# Patient Record
Sex: Male | Born: 1975 | Race: White | Hispanic: No | Marital: Single | Smoking: Former smoker
Health system: Southern US, Community
[De-identification: ages and names within clinical notes are randomized; demographics above are authoritative.]

## PROBLEM LIST (undated history)

## (undated) DIAGNOSIS — J45909 Unspecified asthma, uncomplicated: Secondary | ICD-10-CM

## (undated) DIAGNOSIS — G4733 Obstructive sleep apnea (adult) (pediatric): Secondary | ICD-10-CM

## (undated) DIAGNOSIS — Z9989 Dependence on other enabling machines and devices: Secondary | ICD-10-CM

## (undated) DIAGNOSIS — J309 Allergic rhinitis, unspecified: Secondary | ICD-10-CM

## (undated) DIAGNOSIS — G43909 Migraine, unspecified, not intractable, without status migrainosus: Secondary | ICD-10-CM

## (undated) DIAGNOSIS — F419 Anxiety disorder, unspecified: Secondary | ICD-10-CM

## (undated) DIAGNOSIS — I10 Essential (primary) hypertension: Secondary | ICD-10-CM

## (undated) DIAGNOSIS — B159 Hepatitis A without hepatic coma: Secondary | ICD-10-CM

## (undated) HISTORY — DX: Migraine, unspecified, not intractable, without status migrainosus: G43.909

## (undated) HISTORY — DX: Obstructive sleep apnea (adult) (pediatric): G47.33

## (undated) HISTORY — DX: Essential (primary) hypertension: I10

## (undated) HISTORY — DX: Dependence on other enabling machines and devices: Z99.89

## (undated) HISTORY — DX: Hepatitis a without hepatic coma: B15.9

## (undated) HISTORY — DX: Anxiety disorder, unspecified: F41.9

## (undated) HISTORY — PX: WISDOM TOOTH EXTRACTION: SHX21

## (undated) HISTORY — DX: Unspecified asthma, uncomplicated: J45.909

## (undated) HISTORY — DX: Allergic rhinitis, unspecified: J30.9

---

## 2003-09-23 HISTORY — PX: LASIK: SHX215

## 2011-05-21 LAB — LIPID PANEL
Cholesterol: 191 mg/dL (ref 0–200)
HDL: 65 mg/dL (ref 35–70)
LDL Cholesterol: 110 mg/dL
Triglycerides: 79 mg/dL (ref 40–160)

## 2011-05-21 LAB — TSH: TSH: 1.9 u[IU]/mL (ref 0.41–5.90)

## 2011-11-09 LAB — BASIC METABOLIC PANEL
BUN: 15 mg/dL (ref 4–21)
Creatinine: 0.9 mg/dL (ref 0.6–1.3)
Glucose: 104 mg/dL
Potassium: 4.7 mmol/L (ref 3.4–5.3)
Sodium: 134 mmol/L — AB (ref 137–147)

## 2011-11-09 LAB — CBC AND DIFFERENTIAL
Hemoglobin: 14.5 g/dL (ref 13.5–17.5)
WBC: 5.9 10^3/mL

## 2012-12-16 ENCOUNTER — Encounter: Payer: Self-pay | Admitting: Internal Medicine

## 2012-12-16 ENCOUNTER — Other Ambulatory Visit (INDEPENDENT_AMBULATORY_CARE_PROVIDER_SITE_OTHER): Payer: BC Managed Care – PPO

## 2012-12-16 ENCOUNTER — Telehealth: Payer: Self-pay | Admitting: Internal Medicine

## 2012-12-16 ENCOUNTER — Ambulatory Visit (INDEPENDENT_AMBULATORY_CARE_PROVIDER_SITE_OTHER): Payer: BC Managed Care – PPO | Admitting: Internal Medicine

## 2012-12-16 VITALS — BP 120/88 | HR 78 | Temp 98.1°F | Ht 67.0 in | Wt 209.1 lb

## 2012-12-16 DIAGNOSIS — G4733 Obstructive sleep apnea (adult) (pediatric): Secondary | ICD-10-CM | POA: Insufficient documentation

## 2012-12-16 DIAGNOSIS — J309 Allergic rhinitis, unspecified: Secondary | ICD-10-CM

## 2012-12-16 DIAGNOSIS — F988 Other specified behavioral and emotional disorders with onset usually occurring in childhood and adolescence: Secondary | ICD-10-CM | POA: Insufficient documentation

## 2012-12-16 DIAGNOSIS — Z0000 Encounter for general adult medical examination without abnormal findings: Secondary | ICD-10-CM

## 2012-12-16 DIAGNOSIS — I10 Essential (primary) hypertension: Secondary | ICD-10-CM

## 2012-12-16 DIAGNOSIS — F411 Generalized anxiety disorder: Secondary | ICD-10-CM | POA: Insufficient documentation

## 2012-12-16 DIAGNOSIS — J302 Other seasonal allergic rhinitis: Secondary | ICD-10-CM | POA: Insufficient documentation

## 2012-12-16 DIAGNOSIS — Z23 Encounter for immunization: Secondary | ICD-10-CM

## 2012-12-16 DIAGNOSIS — Z9989 Dependence on other enabling machines and devices: Secondary | ICD-10-CM

## 2012-12-16 LAB — URINALYSIS, ROUTINE W REFLEX MICROSCOPIC
Ketones, ur: NEGATIVE
Leukocytes, UA: NEGATIVE
Specific Gravity, Urine: 1.025 (ref 1.000–1.030)
Urine Glucose: NEGATIVE
pH: 6 (ref 5.0–8.0)

## 2012-12-16 LAB — CBC WITH DIFFERENTIAL/PLATELET
Basophils Relative: 0.7 % (ref 0.0–3.0)
Hemoglobin: 15.5 g/dL (ref 13.0–17.0)
Lymphocytes Relative: 27.3 % (ref 12.0–46.0)
Monocytes Relative: 11.9 % (ref 3.0–12.0)
Neutro Abs: 2.7 10*3/uL (ref 1.4–7.7)
RBC: 5.37 Mil/uL (ref 4.22–5.81)
WBC: 4.9 10*3/uL (ref 4.5–10.5)

## 2012-12-16 LAB — HEPATIC FUNCTION PANEL
ALT: 68 U/L — ABNORMAL HIGH (ref 0–53)
AST: 32 U/L (ref 0–37)
Albumin: 4.7 g/dL (ref 3.5–5.2)
Total Protein: 7.9 g/dL (ref 6.0–8.3)

## 2012-12-16 LAB — BASIC METABOLIC PANEL
Calcium: 9.6 mg/dL (ref 8.4–10.5)
GFR: 68.88 mL/min (ref >60.00)
Sodium: 132 mEq/L — ABNORMAL LOW (ref 135–145)

## 2012-12-16 LAB — LIPID PANEL
Cholesterol: 218 mg/dL — ABNORMAL HIGH (ref 0–200)
Total CHOL/HDL Ratio: 4
Triglycerides: 108 mg/dL (ref 0.0–149.0)

## 2012-12-16 LAB — LDL CHOLESTEROL, DIRECT: Direct LDL: 144.5 mg/dL

## 2012-12-16 MED ORDER — ALPRAZOLAM 0.5 MG PO TABS: 0.5000 mg | ORAL_TABLET | Freq: Every evening | ORAL | Status: DC | PRN

## 2012-12-16 MED ORDER — LISINOPRIL-HYDROCHLOROTHIAZIDE 20-25 MG PO TABS: 1.0000 | ORAL_TABLET | Freq: Every day | ORAL | Status: DC

## 2012-12-16 MED ORDER — FLUTICASONE PROPIONATE 50 MCG/ACT NA SUSP: 2.0000 | Freq: Every day | NASAL | Status: DC

## 2012-12-16 MED ORDER — CETIRIZINE HCL 10 MG PO TABS: 10.0000 mg | ORAL_TABLET | Freq: Every day | ORAL | Status: DC

## 2012-12-16 MED ORDER — AMPHETAMINE-DEXTROAMPHET ER 10 MG PO CP24: 10.0000 mg | ORAL_CAPSULE | ORAL | Status: DC

## 2012-12-16 MED ORDER — DULOXETINE HCL 30 MG PO CPEP: 30.0000 mg | ORAL_CAPSULE | Freq: Every day | ORAL | Status: DC

## 2012-12-16 NOTE — Telephone Encounter (Signed)
Pt has an upcoming consult appt on 01-28-13. Pt wants to know would he be allowed to administer his allergy vaccines at home. He states he moved from [facility] and this is what he used to do. I spoke with Florentina Addison and she states to advise the pt that this decision is made on an individual basis and that he would need to discuss with CY at upcoming appt. Carron Curie, CMA

## 2012-12-16 NOTE — Assessment & Plan Note (Signed)
BP Readings from Last 3 Encounters:  12/16/12 120/88   Reports suboptimally controlled diastolic pressure Add diuretic to ongoing ACE inhibitor - new erx done Recheck in 6-8 weeks, patient call sooner if problems

## 2012-12-16 NOTE — Assessment & Plan Note (Signed)
Chronic history of same, exacerbated by move to [place] in 2013 Recommend daily oral antihistamine and begin nasal steroid spray Previously on allergy shots and would like to resume same, refer to allergist for assistance with this

## 2012-12-16 NOTE — Assessment & Plan Note (Signed)
Long history of same, previously under psychiatry care until 2010 Reports previous trials of Paxil, Zoloft ineffective Also on Cymbalta, Abilify, mood stabilizer and Adderall in past - but reports stopping all in 2010 because "doses were too high" Maintained on low-dose alprazolam as needed, but increasing anxiety symptoms reviewed, manifest with excessive worry Also reports problems with focus because of distractibility and irritability Long discussion regarding medication options and behavioral health therapies for treatment of same After discussion, we'll resume low dose Cymbalta 30 mg daily, extended release of low-dose Adderall and increase alprazolam dose (0.5 mg twice a day when necessary) Agreeable to counseling, referral to behavioral health at this time Followup 6-8 weeks to review symptoms and medications, patient to call sooner if problems Verified no SI/HI

## 2012-12-16 NOTE — Assessment & Plan Note (Signed)
Will send to prior PCP for copy of sleep study done in [place] 12/2011  continue CPAP as ongoing

## 2012-12-16 NOTE — Progress Notes (Signed)
Subjective:    Patient ID: Tony [name], male    DOB: May 12, 1976, 37 y.o.   MRN: [id number]  HPI  New patient today to me and our practice, here today to establish care patient is here today for annual physical. Patient feels well overall.  Also reviewed chronic medical issues: Anxiety - chronic history of same. Overlap with depression and ADD symptoms. Problems increasing in recent months precipitated by move to [facility] and change in jobs. Manifest with excessive worry and poor sleep. Numerous med trials in past and effective, no daily medication therapy since 2010, but would be willing to resume same. Also requests increase in xanax dose which he uses as needed  hypertension - on ACE inhibitor daily. Reports diastolic uncontrolled. Denies chest pain, headache, visual changes or edema  allergic rhinitis -  Increase in symptoms precipitated by move to [facility]. Remotely on allergy shots and interested in resuming same. Manifest with recurrent sinus infections, rhinorrhea, sneezing and eye itching/watery  OSA - on CPAP since 12/2011 - sleep study done in [place] - describes as "severe". the patient reports compliance with medication(s) as prescribed. Denies adverse side effects.   Past Medical History  Diagnosis Date  . Asthma   . Anxiety     overlap with depression and ADD  . Hypertension   . Hepatitis A   . Migraines   . OSA on CPAP     Sleep study 12/2011: severe, CPAP  . Allergic rhinitis    Family History  Problem Relation Age of Onset  . Hypertension Mother   . Arthritis Father   . Hypertension Father   . Diabetes Other    History  Substance Use Topics  . Smoking status: Former Smoker    Quit date: 09/22/2008  . 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 Alcohol Use: Yes     Comment: OCCASSIONAL   Review of Systems Constitutional: Negative for fever + unexpected  weight change (Increased 20 pounds in past 12 months).  Respiratory: Negative for cough and shortness of breath.   Cardiovascular: Negative for chest pain or palpitations.  Gastrointestinal: Negative for abdominal pain, no bowel changes.  Musculoskeletal: Negative for gait problem or joint swelling.  Skin: Negative for rash.  Neurological: Negative for dizziness or headache.  No other specific complaints in a complete review of systems (except as listed in HPI above).     Objective:   Physical Exam BP 120/88  Pulse 78  Temp(Src) 98.1 F (36.7 C) (Oral)  Ht 5\' 7"  (1.702 m)  Wt 209 lb 1.9 oz (94.856 kg)  BMI 32.75 kg/m2  SpO2 96% Wt Readings from Last 3 Encounters:  12/16/12 209 lb 1.9 oz (94.856 kg)   Constitutional: he is overweight, but appears well-developed and well-nourished. No distress.  HENT: Head: Normocephalic and atraumatic. Ears: B TMs ok, no erythema or effusion; Nose: Nose clear rhinorrhea, no turbinate swelling. Mouth/Throat: Oropharynx is clear and moist. No oropharyngeal exudate.  Eyes: Conjunctivae and EOM are normal. Pupils are equal, round, and reactive to light. No scleral icterus.  Neck: Thick. Normal range of motion. Neck supple. No JVD present. No thyromegaly present.  Cardiovascular: Normal rate, regular rhythm and normal heart sounds.  No murmur heard. No BLE edema. Pulmonary/Chest: Effort normal and breath sounds normal. No respiratory distress. he has no wheezes.  Abdominal: Soft. Bowel sounds are normal. he exhibits no distension. There  is no tenderness. no masses Musculoskeletal: Normal range of motion, no joint effusions. No gross deformities Neurological: he is alert and oriented to person, place, and time. No cranial nerve deficit. Coordination normal.  Skin: Skin is warm and dry. No rash noted. No erythema.  Psychiatric: he has an anxious mood and affect. behavior is normal. Judgment and thought content normal.   No results found for this basename:  WBC,  HGB,  HCT,  PLT,  GLUCOSE,  CHOL,  TRIG,  HDL,  LDLDIRECT,  LDLCALC,  ALT,  AST,  NA,  K,  CL,  CREATININE,  BUN,  CO2,  TSH,  PSA,  INR,  GLUF,  HGBA1C,  MICROALBUR       Assessment & Plan:   CPX/v70.0 - Today patient counseled on age appropriate routine health concerns for screening and prevention, each reviewed and up to date or declined. Immunizations reviewed and up to date or declined. Labs/ECG reviewed. Risk factors for depression reviewed and negative. Hearing function and visual acuity are intact. ADLs screened and addressed as needed. Functional ability and level of safety reviewed and appropriate. Education, counseling and referrals performed based on assessed risks today. Patient provided with a copy of personalized plan for preventive services.  Also See problem list. Medications and labs reviewed today.

## 2012-12-16 NOTE — Patient Instructions (Signed)
It was good to see you today. We have reviewed your prior records including labs and tests today Health Maintenance reviewed - all recommended immunizations and age-appropriate screenings are up-to-date. we will send to your prior provider(s) for "release of records" as discussed today -  Test(s) ordered today. Your results will be released to MyChart (or called to you) after review, usually within 72hours after test completion. If any changes need to be made, you will be notified at that same time. Increase dose of Xanax Change lisinopril to include diuretic Continue Zyrtec every day and add Flonase nose spray each morning Start Cymbalta 30 mg daily and extended release low-dose Adderall Your prescription(s) have been submitted to your pharmacy electronically, or given to you to take to your pharmacy (controlled substance). Please take as directed and contact our office if you believe you are having problem(s) with the medication(s). Will refer to allergist and to psychologist for counseling - Our office will contact you regarding appointment(s) once made. Please schedule followup in 6-8 weeks to review medications and symptoms + recheck blood pressure, call sooner if problems.  Work on lifestyle changes as discussed (low fat, low carb, increased protein diet; improved exercise efforts; weight loss) to control sugar, blood pressure and cholesterol levels and/or reduce risk of developing other medical problems. Look into [email or web address].com.[email or web address] or other type of food journal to assist you in this process.  Health Maintenance, Males A healthy lifestyle and preventative care can promote health and wellness.  Maintain regular health, dental, and eye exams.  Eat a healthy diet. Foods like vegetables, fruits, whole grains, low-fat dairy products, and lean protein foods contain the nutrients you need without too many calories. Decrease your intake of foods high in solid fats, added sugars, and salt. Get  information about a proper diet from your caregiver, if necessary.  Regular physical exercise is one of the most important things you can do for your health. Most adults should get at least 150 minutes of moderate-intensity exercise (any activity that increases your heart rate and causes you to sweat) each week. In addition, most adults need muscle-strengthening exercises on 2 or more days a week.   Maintain a healthy weight. The body mass index (BMI) is a screening tool to identify possible weight problems. It provides an estimate of body fat based on height and weight. Your caregiver can help determine your BMI, and can help you achieve or maintain a healthy weight. For adults 20 years and older:  A BMI below 18.5 is considered underweight.  A BMI of 18.5 to 24.9 is normal.  A BMI of 25 to 29.9 is considered overweight.  A BMI of 30 and above is considered obese.  Maintain normal blood lipids and cholesterol by exercising and minimizing your intake of saturated fat. Eat a balanced diet with plenty of fruits and vegetables. Blood tests for lipids and cholesterol should begin at age 45 and be repeated every 5 years. If your lipid or cholesterol levels are high, you are over 50, or you are a high risk for heart disease, you may need your cholesterol levels checked more frequently.Ongoing high lipid and cholesterol levels should be treated with medicines, if diet and exercise are not effective.  If you smoke, find out from your caregiver how to quit. If you do not use tobacco, do not start.  If you choose to drink alcohol, do not exceed 2 drinks per day. One drink is considered to be 12 ounces (355 mL) of beer, 5  ounces (148 mL) of wine, or 1.5 ounces (44 mL) of liquor.  Avoid use of street drugs. Do not share needles with anyone. Ask for help if you need support or instructions about stopping the use of drugs.  High blood pressure causes heart disease and increases the risk of stroke. Blood  pressure should be checked at least every 1 to 2 years. Ongoing high blood pressure should be treated with medicines if weight loss and exercise are not effective.  If you are 43 to 38 years old, ask your caregiver if you should take aspirin to prevent heart disease.  Diabetes screening involves taking a blood sample to check your fasting blood sugar level. This should be done once every 3 years, after age 62, if you are within normal weight and without risk factors for diabetes. Testing should be considered at a younger age or be carried out more frequently if you are overweight and have at least 1 risk factor for diabetes.  Colorectal cancer can be detected and often prevented. Most routine colorectal cancer screening begins at the age of 86 and continues through age 69. However, your caregiver may recommend screening at an earlier age if you have risk factors for colon cancer. On a yearly basis, your caregiver may provide home test kits to check for hidden blood in the stool. Use of a small camera at the end of a tube, to directly examine the colon (sigmoidoscopy or colonoscopy), can detect the earliest forms of colorectal cancer. Talk to your caregiver about this at age 39, when routine screening begins. Direct examination of the colon should be repeated every 5 to 10 years through age 49, unless early forms of pre-cancerous polyps or small growths are found.  Hepatitis C blood testing is recommended for all people born from [birth year] through 1966 and any individual with known risks for hepatitis C.  Healthy men should no longer receive prostate-specific antigen (PSA) blood tests as part of routine cancer screening. Consult with your caregiver about prostate cancer screening.  Testicular cancer screening is not recommended for adolescents or adult males who have no symptoms. Screening includes self-exam, caregiver exam, and other screening tests. Consult with your caregiver about any symptoms you have or  any concerns you have about testicular cancer.  Practice safe sex. Use condoms and avoid high-risk sexual practices to reduce the spread of sexually transmitted infections (STIs).  Use sunscreen with a sun protection factor (SPF) of 30 or greater. Apply sunscreen liberally and repeatedly throughout the day. You should seek shade when your shadow is shorter than you. Protect yourself by wearing long sleeves, pants, a wide-brimmed hat, and sunglasses year round, whenever you are outdoors.  Notify your caregiver of new moles or changes in moles, especially if there is a change in shape or color. Also notify your caregiver if a mole is larger than the size of a pencil eraser.  A one-time screening for abdominal aortic aneurysm (AAA) and surgical repair of large AAAs by sound wave imaging (ultrasonography) is recommended for ages 39 to 23 years who are current or former smokers.  Stay current with your immunizations. Document Released: 03/06/2008 Document Revised: 12/01/2011 Document Reviewed: 02/03/2011 [facility] Patient Information 2013 [place], [place].

## 2012-12-29 ENCOUNTER — Ambulatory Visit (INDEPENDENT_AMBULATORY_CARE_PROVIDER_SITE_OTHER): Payer: BC Managed Care – PPO | Admitting: Psychiatry

## 2012-12-29 DIAGNOSIS — F4010 Social phobia, unspecified: Secondary | ICD-10-CM

## 2013-01-14 ENCOUNTER — Telehealth: Payer: Self-pay | Admitting: Internal Medicine

## 2013-01-17 MED ORDER — AMPHETAMINE-DEXTROAMPHET ER 10 MG PO CP24: 10.0000 mg | ORAL_CAPSULE | ORAL | Status: DC

## 2013-01-17 NOTE — Telephone Encounter (Signed)
Patient has requested refill on Adderall per my chart. Prescription present time, given to Valentina Gu - please notify patient of same

## 2013-01-17 NOTE — Telephone Encounter (Signed)
Called pt no answer LMOM rx ready for pick-up.../lmb 

## 2013-01-20 ENCOUNTER — Encounter: Payer: Self-pay | Admitting: Internal Medicine

## 2013-01-20 ENCOUNTER — Ambulatory Visit (INDEPENDENT_AMBULATORY_CARE_PROVIDER_SITE_OTHER): Payer: BC Managed Care – PPO | Admitting: Internal Medicine

## 2013-01-20 VITALS — BP 120/90 | HR 82 | Temp 98.4°F | Wt 214.0 lb

## 2013-01-20 DIAGNOSIS — F988 Other specified behavioral and emotional disorders with onset usually occurring in childhood and adolescence: Secondary | ICD-10-CM

## 2013-01-20 DIAGNOSIS — G43909 Migraine, unspecified, not intractable, without status migrainosus: Secondary | ICD-10-CM

## 2013-01-20 DIAGNOSIS — F411 Generalized anxiety disorder: Secondary | ICD-10-CM

## 2013-01-20 MED ORDER — AMPHETAMINE-DEXTROAMPHET ER 15 MG PO CP24: 15.0000 mg | ORAL_CAPSULE | ORAL | Status: DC

## 2013-01-20 MED ORDER — SUMATRIPTAN SUCCINATE 50 MG PO TABS: 50.0000 mg | ORAL_TABLET | Freq: Once | ORAL | Status: DC | PRN

## 2013-01-20 NOTE — Assessment & Plan Note (Signed)
Long history of same, previously under psychiatry care until 2010 Reports previous trials of Paxil, Zoloft ineffective Previously tried on Cymbalta, Abilify, mood stabilizer and Adderall in past - but reports stopping all in 2010 because "doses were too high" increasing anxiety symptoms reviewed, manifest with excessive worry, distractibility and irritability repeat discussion regarding medication options and behavioral health therapies for treatment of same Tried resumed Cymbalta 30 mg qd, low-dose Adderall XR and increase alprazolam dose (0.5 mg twice a day when necessary) 12/16/12  Intol of cymbalta side effects - does not wish to resume "antidepressant" at this time Will increase Adderall xr to 15mg  - new rx done s/p single evaluation bu Dr Noe Gens for counseling 12/2012 - no plans for follow up  Verified no SI/HI, pt will call if problems unimproved with med changes or if symptoms worse

## 2013-01-20 NOTE — Progress Notes (Signed)
  Subjective:    Patient ID: Tony [name], male    DOB: [birth year], 37 y.o.   MRN: [id number]  HPI   Here for 6 week follow up - anxiety and ADD  Past Medical History  Diagnosis Date  . Asthma   . Anxiety     overlap with depression and ADD  . Hypertension   . Hepatitis A   . Migraines   . OSA on CPAP     Sleep study 12/2011: severe, CPAP  . Allergic rhinitis     Review of Systems  Constitutional: Negative for fever and fatigue.  Respiratory: Negative for cough and shortness of breath.   Cardiovascular: Negative for chest pain and palpitations.  Psychiatric/Behavioral: Negative for behavioral problems, dysphoric mood and decreased concentration.       Objective:   Physical Exam  BP 120/90  Pulse 82  Temp(Src) 98.4 F (36.9 C) (Oral)  Wt 214 lb (97.07 kg)  BMI 33.51 kg/m2  SpO2 99% Wt Readings from Last 3 Encounters:  01/20/13 214 lb (97.07 kg)  12/16/12 209 lb 1.9 oz (94.856 kg)   Constitutional: he is overweight, but appears well-developed and well-nourished. No distress.   Neck: Thick. Normal range of motion. Neck supple. No JVD present. No thyromegaly present.  Cardiovascular: Normal rate, regular rhythm and normal heart sounds.  No murmur heard. No BLE edema. Pulmonary/Chest: Effort normal and breath sounds normal. No respiratory distress. he has no wheezes.  Psychiatric: he has an anxious mood and affect. behavior is normal. Judgment and thought content normal.   Lab Results  Component Value Date   WBC 4.9 12/16/2012   HGB 15.5 12/16/2012   HCT 45.5 12/16/2012   PLT 329.0 12/16/2012   GLUCOSE 101* 12/16/2012   CHOL 218* 12/16/2012   TRIG 108.0 12/16/2012   HDL 53.10 12/16/2012   LDLDIRECT 144.5 12/16/2012   LDLCALC 110 05/21/2011   ALT 68* 12/16/2012   AST 32 12/16/2012   NA 132* 12/16/2012   K 4.5 12/16/2012   CL 97 12/16/2012   CREATININE 1.3 12/16/2012   BUN 16 12/16/2012   CO2 26 12/16/2012   TSH 2.21 12/16/2012      Assessment & Plan:   See  problem list. Medications and labs reviewed today.

## 2013-01-20 NOTE — Assessment & Plan Note (Signed)
Infrequent episodes, uses Imitrex as needed Refill same today, neuro exam benign

## 2013-01-20 NOTE — Patient Instructions (Signed)
It was good to see you today. We have reviewed your prior records including labs and tests today Stop cymbalta - no "antidepresant" replacement at this time Increase dose Adderall to 15mg  daily - Your prescription(s) have been given to you to submit to your pharmacy. Please take as directed and contact our office if you believe you are having problem(s) with the medication(s). If symptoms unimproved followup visit in 6-8 weeks to review, call sooner if worse Otherwise followup in 6 months for blood pressure and weight check, call sooner if problems

## 2013-01-20 NOTE — Assessment & Plan Note (Signed)
Increase Adderall xr to 15mg  qd now - rx done

## 2013-01-28 ENCOUNTER — Institutional Professional Consult (permissible substitution): Payer: BC Managed Care – PPO | Admitting: Internal Medicine

## 2013-02-25 ENCOUNTER — Other Ambulatory Visit: Payer: Self-pay | Admitting: Internal Medicine

## 2013-02-25 MED ORDER — AMPHETAMINE-DEXTROAMPHET ER 15 MG PO CP24: 15.0000 mg | ORAL_CAPSULE | ORAL | Status: DC

## 2013-03-17 ENCOUNTER — Ambulatory Visit (INDEPENDENT_AMBULATORY_CARE_PROVIDER_SITE_OTHER): Payer: BC Managed Care – PPO | Admitting: Internal Medicine

## 2013-03-17 ENCOUNTER — Encounter: Payer: Self-pay | Admitting: Internal Medicine

## 2013-03-17 VITALS — BP 118/78 | HR 89 | Ht 67.0 in | Wt 195.4 lb

## 2013-03-17 DIAGNOSIS — J309 Allergic rhinitis, unspecified: Secondary | ICD-10-CM

## 2013-03-17 DIAGNOSIS — J4520 Mild intermittent asthma, uncomplicated: Secondary | ICD-10-CM

## 2013-03-17 DIAGNOSIS — J45909 Unspecified asthma, uncomplicated: Secondary | ICD-10-CM

## 2013-03-17 DIAGNOSIS — G4733 Obstructive sleep apnea (adult) (pediatric): Secondary | ICD-10-CM

## 2013-03-17 NOTE — Patient Instructions (Addendum)
Order- DME establish for CPAP service and maintenance 10 cwp, supplies, mask of choice    He has a machine    Dx OSA  Order lab- Allergy Profile      Dx allergic rhinitis  Schedule for allergy skin testing- antihistamines block the skin tests, so for 3 days ahead Please- no antihistamines, otc allergy or cough meds,   Please call as needed

## 2013-03-17 NOTE — Progress Notes (Signed)
Subjective:    Patient ID: Tony [name], male    DOB: 25-Oct-1975, 37 y.o.   MRN: [id number]  HPI 37 yo M former smoker with hx OSA/ CPAP, allergic rhinitis Referred by Dr Felicity Coyer for allergy testing. Allergy [facility], [place], [place] Describes lifelong seasonal and perennial allergic rhinitis. Followed by an ENT/allergy group in [place]. Last allergy skin testing was a long time ago-positive for mold, grass and common allergens. Allergy vaccine did help at that time but he quit after a year. Now wakes up with nasal congestion. Worse if visiting [place] rather than here and worse in spring, best in winter. Takes daily Zyrtec and Flonase. Little asthma after childhood.. No history of skin, food or insect sensitivities. NPSG 01/19/12- AHI 49.0/ hr, Weight 186 lbs. CPAP titrated to 10 cwp.  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 Parents living, father with asthma, nobody known to have OSA.   Prior to Admission medications   Medication Sig Start Date End Date Taking? Authorizing Provider  ALPRAZolam Prudy Feeler) 0.5 MG tablet Take 1 tablet (0.5 mg total) by mouth at bedtime as needed for sleep or anxiety. 12/16/12  Yes Newt Lukes, MD  amphetamine-dextroamphetamine (ADDERALL XR) 15 MG 24 hr capsule Take 1 capsule (15 mg total) by mouth every morning. 02/25/13  Yes Nicki Reaper, NP  cetirizine (ZYRTEC) 10 MG tablet Take 1 tablet (10 mg total) by mouth daily. 12/16/12  Yes Newt Lukes, MD  fluticasone (FLONASE) 50 MCG/ACT nasal spray Place 2 sprays into the nose daily. 12/16/12  Yes Newt Lukes, MD  lisinopril-hydrochlorothiazide (PRINZIDE,ZESTORETIC) 20-25 MG per tablet Take 1 tablet by mouth daily. 12/16/12  Yes Newt Lukes, MD  SUMAtriptan (IMITREX) 50 MG tablet Take 1 tablet (50 mg total) by mouth once as needed for migraine. 01/20/13  Yes Newt Lukes, MD   Past Medical History  Diagnosis Date  . Asthma   . Anxiety     overlap with  depression and ADD  . Hypertension   . Hepatitis A   . Migraines   . OSA on CPAP     Sleep study 12/2011: severe, CPAP  . Allergic rhinitis   . Migraine    Past Surgical History  Procedure Laterality Date  . Lasik  2005  . Wisdom tooth extraction     Family History  Problem Relation Age of Onset  . Hypertension Mother   . Arthritis Father   . Hypertension Father   . Diabetes Other   . Emphysema Paternal Grandfather   . Asthma Paternal Grandfather    History   Social History  . Marital Status: Single    Spouse Name: N/A    Number of Children: N/A  . Years of Education: N/A   Occupational History  . 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 Smoking status: Former Smoker -- 0.24 packs/day for 3 years    Types: Cigarettes    Quit date: 09/22/2008  . 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 Alcohol Use: Yes     Comment: OCCASSIONAL  . Drug Use: No  . Sexually Active: Not on file   Other Topics Concern  . Not on file   Social History Narrative  . No narrative on file   Review of Systems  Constitutional: Negative for fever and unexpected weight change.  HENT:  Positive for congestion, rhinorrhea, sneezing, postnasal drip and sinus pressure. Negative for ear pain, nosebleeds, sore throat, trouble swallowing and dental problem.        Occasional symptoms  Eyes: Negative for redness and itching.  Respiratory: Positive for cough. Negative for chest tightness, shortness of breath and wheezing.   Cardiovascular: Negative for palpitations and leg swelling.  Gastrointestinal: Negative for nausea and vomiting.  Genitourinary: Negative for dysuria.  Musculoskeletal: Negative for joint swelling.  Skin: Negative for rash.  Neurological: Positive for headaches.  Hematological: Does not bruise/bleed easily.  Psychiatric/Behavioral: Negative for dysphoric  mood. The patient is not nervous/anxious.        Objective:   Physical Exam OBJ- Physical Exam General- Alert, Oriented, Affect-appropriate, Distress- none acute. Mild over-weight Skin- rash-none, lesions- none, excoriation- none Lymphadenopathy- none Head- atraumatic            Eyes- Gross vision intact, PERRLA, conjunctivae and secretions clear            Ears- Hearing, canals-normal            Nose- Clear, no-Septal dev, mucus, polyps, erosion, perforation             Throat- Mallampati II-III , mucosa clear , drainage- none, tonsils- atrophic Neck- flexible , trachea midline, no stridor , thyroid nl, carotid no bruit Chest - symmetrical excursion , unlabored           Heart/CV- RRR , no murmur , no gallop  , no rub, nl s1 s2                           - JVD- none , edema- none, stasis changes- none, varices- none           Lung- clear to P&A, wheeze- none, cough- none , dullness-none, rub- none           Chest wall-  Abd- tender-no, distended-no, bowel sounds-present, HSM- no Br/ Gen/ Rectal- Not done, not indicated Extrem- cyanosis- none, clubbing, none, atrophy- none, strength- nl Neuro- grossly intact to observation     Assessment & Plan:

## 2013-03-18 ENCOUNTER — Other Ambulatory Visit: Payer: BC Managed Care – PPO

## 2013-03-18 DIAGNOSIS — J309 Allergic rhinitis, unspecified: Secondary | ICD-10-CM

## 2013-03-21 LAB — ALLERGY FULL PROFILE
Allergen, D pternoyssinus,d7: 5.09 kU/L — ABNORMAL HIGH
Bahia Grass: 0.16 kU/L — ABNORMAL HIGH
Box Elder IgE: 0.17 kU/L — ABNORMAL HIGH
Common Ragweed: <0.1 kU/L
Dog Dander: 0.21 kU/L — ABNORMAL HIGH
Elm IgE: <0.1 kU/L
G005 Rye, Perennial: 0.25 kU/L — ABNORMAL HIGH
G009 Red Top: 0.35 kU/L — ABNORMAL HIGH
House Dust Hollister: 0.38 kU/L — ABNORMAL HIGH
Oak: <0.1 kU/L
Sycamore Tree: <0.1 kU/L
Timothy Grass: 0.26 kU/L — ABNORMAL HIGH

## 2013-03-23 NOTE — Progress Notes (Signed)
Quick Note:  Pt aware of results. ______ 

## 2013-03-31 DIAGNOSIS — J4520 Mild intermittent asthma, uncomplicated: Secondary | ICD-10-CM | POA: Insufficient documentation

## 2013-03-31 NOTE — Assessment & Plan Note (Addendum)
Remote skin testing and allergy vaccine. Plan-return for allergy skin testing

## 2013-03-31 NOTE — Assessment & Plan Note (Signed)
This sounds really minimal, but he would like to have a rescue inhaler available

## 2013-03-31 NOTE — Assessment & Plan Note (Signed)
Good compliance and control with CPAP but needs to establish with DME here for supplies

## 2013-04-14 ENCOUNTER — Encounter: Payer: Self-pay | Admitting: Internal Medicine

## 2013-04-14 ENCOUNTER — Ambulatory Visit (INDEPENDENT_AMBULATORY_CARE_PROVIDER_SITE_OTHER): Payer: BC Managed Care – PPO | Admitting: Internal Medicine

## 2013-04-14 VITALS — BP 120/84 | HR 72 | Temp 97.5°F | Wt 184.8 lb

## 2013-04-14 DIAGNOSIS — I10 Essential (primary) hypertension: Secondary | ICD-10-CM

## 2013-04-14 DIAGNOSIS — F988 Other specified behavioral and emotional disorders with onset usually occurring in childhood and adolescence: Secondary | ICD-10-CM

## 2013-04-14 DIAGNOSIS — E663 Overweight: Secondary | ICD-10-CM | POA: Insufficient documentation

## 2013-04-14 DIAGNOSIS — K429 Umbilical hernia without obstruction or gangrene: Secondary | ICD-10-CM

## 2013-04-14 MED ORDER — AMPHETAMINE-DEXTROAMPHET ER 15 MG PO CP24: 15.0000 mg | ORAL_CAPSULE | ORAL | Status: DC

## 2013-04-14 NOTE — Patient Instructions (Signed)
It was good to see you today. We have reviewed your prior records including labs and tests today Good work on the weight loss! Keep up the efforts!! we'll make referral to general surgeon for evaluation of your hernia. Our office will contact you regarding appointment(s) once made.

## 2013-04-14 NOTE — Assessment & Plan Note (Signed)
BP Readings from Last 3 Encounters:  04/14/13 120/84  03/17/13 118/78  01/20/13 120/90   Added diuretic to ongoing ACE inhibitor 11/2012 Improving with weight loss The current medical regimen is effective;  continue present plan and medications.

## 2013-04-14 NOTE — Assessment & Plan Note (Signed)
Increased Adderall xr to 15mg qd  01/2013, doing well Refill today - rx done 

## 2013-04-14 NOTE — Progress Notes (Signed)
  Subjective:    Patient ID: Tony [name], male    DOB: [birth year], 37 y.o.   MRN: [id number]  HPI   Here for evaluation of ?umbilical hernia  Past Medical History  Diagnosis Date  . Asthma   . Anxiety     overlap with depression and ADD  . Hypertension   . Hepatitis A   . Migraines   . OSA on CPAP     Sleep study 12/2011: severe, CPAP  . Allergic rhinitis   . Migraine     Review of Systems  Constitutional: Negative for fever and fatigue.  Respiratory: Negative for cough and shortness of breath.   Cardiovascular: Negative for chest pain, palpitations and leg swelling.  Gastrointestinal: Negative for nausea, vomiting, abdominal pain, diarrhea and constipation.  Psychiatric/Behavioral: Negative for dysphoric mood and decreased concentration.       Objective:   Physical Exam  BP 120/84  Pulse 72  Temp(Src) 97.5 F (36.4 C) (Oral)  Wt 184 lb 12.8 oz (83.825 kg)  BMI 28.94 kg/m2  SpO2 99% Wt Readings from Last 3 Encounters:  04/14/13 184 lb 12.8 oz (83.825 kg)  03/17/13 195 lb 6.4 oz (88.633 kg)  01/20/13 214 lb (97.07 kg)   Constitutional: he is overweight, but appears well-developed and well-nourished. No distress.   Cardiovascular: Normal rate, regular rhythm and normal heart sounds.  No murmur heard. No BLE edema. Pulmonary/Chest: Effort normal and breath sounds normal. No respiratory distress. he has no wheezes.  Abdomen: small 2 cm reducible umbilical hernia, left of midline -nontender, no evidence for incarceration or obstruction - remaining abdomen soft, nontender, positive bowel sounds throughout Psychiatric: he has a minimally anxious mood and affect. behavior is normal. Judgment and thought content normal.   Lab Results  Component Value Date   WBC 4.9 12/16/2012   HGB 15.5 12/16/2012   HCT 45.5 12/16/2012   PLT 329.0 12/16/2012   GLUCOSE 101* 12/16/2012   CHOL 218* 12/16/2012   TRIG 108.0 12/16/2012   HDL 53.10 12/16/2012   LDLDIRECT 144.5 12/16/2012   LDLCALC 110 05/21/2011   ALT 68* 12/16/2012   AST 32 12/16/2012   NA 132* 12/16/2012   K 4.5 12/16/2012   CL 97 12/16/2012   CREATININE 1.3 12/16/2012   BUN 16 12/16/2012   CO2 26 12/16/2012   TSH 2.21 12/16/2012      Assessment & Plan:   Umbilical hernia, no evidence of obstruction or incarceration. Will refer to Gen. Surgery for evaluation and discussion of potential treatment options -education + reassurance provided today  See problem list. Medications and labs reviewed today.

## 2013-04-14 NOTE — Assessment & Plan Note (Signed)
Wt Readings from Last 3 Encounters:  04/14/13 184 lb 12.8 oz (83.825 kg)  03/17/13 195 lb 6.4 oz (88.633 kg)  01/20/13 214 lb (97.07 kg)   Intentional 30 pound weight loss since May 2014 reviewed - now BMI<30 Encouraged ongoing exercise and diet efforts

## 2013-04-15 ENCOUNTER — Encounter: Payer: Self-pay | Admitting: Internal Medicine

## 2013-04-18 MED ORDER — ACYCLOVIR 5 % EX OINT: TOPICAL_OINTMENT | CUTANEOUS | Status: DC

## 2013-04-28 ENCOUNTER — Telehealth (INDEPENDENT_AMBULATORY_CARE_PROVIDER_SITE_OTHER): Payer: Self-pay | Admitting: Surgery

## 2013-04-28 ENCOUNTER — Encounter: Payer: Self-pay | Admitting: Internal Medicine

## 2013-04-28 ENCOUNTER — Ambulatory Visit (INDEPENDENT_AMBULATORY_CARE_PROVIDER_SITE_OTHER): Payer: BC Managed Care – PPO | Admitting: Surgery

## 2013-04-28 ENCOUNTER — Encounter (INDEPENDENT_AMBULATORY_CARE_PROVIDER_SITE_OTHER): Payer: Self-pay | Admitting: Surgery

## 2013-04-28 VITALS — BP 112/70 | HR 76 | Temp 97.6°F | Resp 15 | Ht 66.0 in | Wt 185.4 lb

## 2013-04-28 DIAGNOSIS — K429 Umbilical hernia without obstruction or gangrene: Secondary | ICD-10-CM

## 2013-04-28 MED ORDER — ALBUTEROL SULFATE HFA 108 (90 BASE) MCG/ACT IN AERS: 2.0000 | INHALATION_SPRAY | Freq: Four times a day (QID) | RESPIRATORY_TRACT | Status: DC | PRN

## 2013-04-28 NOTE — Progress Notes (Signed)
Patient ID: Tony [name], male   DOB: 09-23-1975, 37 y.o.   MRN: [id number]  Chief Complaint  Patient presents with  . New Evaluation    eval [facility] hernia    HPI Tony [name] is a 37 y.o. male.  Patient sent at request of Dr. Felicity Coyer  for umbilical hernia.he has lost 30 pounds over the last 6 months and has noticed a hernia more. It causes mild to moderate discomfort with lifting, pushing or pulling. It pops and it pops out without difficulty. No associated nausea or vomiting. HPI  Past Medical History  Diagnosis Date  . Asthma   . Anxiety     overlap with depression and ADD  . Hypertension   . Hepatitis A   . Migraines   . OSA on CPAP     Sleep study 12/2011: severe, CPAP  . Allergic rhinitis   . Migraine     Past Surgical History  Procedure Laterality Date  . Lasik  2005  . Wisdom tooth extraction      Family History  Problem Relation Age of Onset  . Hypertension Mother   . Arthritis Father   . Hypertension Father   . Diabetes Other   . Emphysema Paternal Grandfather   . Asthma Paternal Grandfather     Social History History  Substance Use Topics  . Smoking status: Former Smoker -- 0.24 packs/day for 3 years    Types: Cigarettes    Quit date: 09/22/2008  . 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 Alcohol Use: Yes     Comment: OCCASSIONAL    Allergies  Allergen Reactions  . Erythromycin     Pt states med make him sick    Current Outpatient Prescriptions  Medication Sig Dispense Refill  . acyclovir ointment (ZOVIRAX) 5 % Apply topically every 3 (three) hours.  15 g  1  . ALPRAZolam (XANAX) 0.5 MG tablet Take 1 tablet (0.5 mg total) by mouth at bedtime as needed for sleep or anxiety.  60 tablet  2  . amphetamine-dextroamphetamine (ADDERALL XR) 15 MG 24 hr capsule Take 1 capsule (15 mg total) by mouth every morning.  30 capsule  0  . cetirizine (ZYRTEC) 10 MG tablet Take  1 tablet (10 mg total) by mouth daily.  30 tablet  11  . fluticasone (FLONASE) 50 MCG/ACT nasal spray Place 2 sprays into the nose daily.  16 g  2  . lisinopril-hydrochlorothiazide (PRINZIDE,ZESTORETIC) 20-25 MG per tablet Take 1 tablet by mouth daily.  90 tablet  3  . SUMAtriptan (IMITREX) 50 MG tablet Take 1 tablet (50 mg total) by mouth once as needed for migraine.  12 tablet  1   No current facility-administered medications for this visit.    Review of Systems Review of Systems  Constitutional: Negative for fever, chills and unexpected weight change.  HENT: Negative for hearing loss, congestion, sore throat, trouble swallowing and voice change.   Eyes: Negative for visual disturbance.  Respiratory: Negative for cough and wheezing.   Cardiovascular: Negative for chest pain, palpitations and leg swelling.  Gastrointestinal: Positive for abdominal pain. Negative for nausea, vomiting, diarrhea, constipation, blood in stool, abdominal distention, anal bleeding and rectal pain.  Genitourinary: Negative for hematuria and difficulty urinating.  Musculoskeletal: Negative for arthralgias.  Skin: Negative for rash and wound.  Neurological: Negative for seizures, syncope, weakness and headaches.  Hematological: Negative for adenopathy. Does not  bruise/bleed easily.  Psychiatric/Behavioral: Negative for confusion.    Blood pressure 112/70, pulse 76, temperature 97.6 F (36.4 C), temperature source Temporal, resp. rate 15, height 5\' 6"  (1.676 m), weight 185 lb 6.4 oz (84.097 kg).  Physical Exam Physical Exam  Constitutional: He is oriented to person, place, and time. He appears well-developed and well-nourished.  HENT:  Head: Normocephalic and atraumatic.  Eyes: EOM are normal. Pupils are equal, round, and reactive to light.  Neck: Normal range of motion.  Cardiovascular: Normal rate and regular rhythm.   Pulmonary/Chest: Effort normal and breath sounds normal.  Abdominal: There is no  tenderness. A hernia is present.    Musculoskeletal: Normal range of motion.  Neurological: He is alert and oriented to person, place, and time.  Skin: Skin is warm and dry.  Psychiatric: He has a normal mood and affect. His behavior is normal. Judgment and thought content normal.    Data Reviewed Dr Felicity Coyer    notes   Assessment    Umbilical hernia symptomatic    Plan    Discussed operative and nonoperative options. Risks, benefits and alternatives discussed. Watchful waiting versus surgical repair discussed. He wished to have it repaired.The risk of hernia repair include bleeding,  Infection,   Recurrence of the hernia,  Mesh use, chronic pain,  Organ injury,  Bowel injury,  Bladder injury,   nerve injury with numbness around the incision,  Death,  and worsening of preexisting  medical problems.  The alternatives to surgery have been discussed as well..  Long term expectations of both operative and non operative treatments have been discussed.   The patient agrees to proceed.       [name] A. 04/28/2013, 12:26 PM

## 2013-04-28 NOTE — Patient Instructions (Signed)
Hernia, Surgical Repair Care After Refer to this sheet in the next few weeks. These discharge instructions provide you with general information on caring for yourself after you leave the hospital. Your caregiver may also give you specific instructions. Your treatment has been planned according to the most current medical practices available, but unavoidable complications sometimes occur. If you have any problems or questions after discharge, please call your caregiver. HOME CARE INSTRUCTIONS   It is normal to be sore for a couple weeks after surgery. See your caregiver if this seems to be getting worse rather than better.  Put ice on the operative site.  Put ice in a plastic bag.  Place a towel between your skin and the bag.  Leave the ice on for 15-20 minutes at a time, 3-4 times a day for the first 2 days.  Change bandages (dressings) as directed.  Keep the wound dry and clean. The wound may be washed gently with soap and water. Gently blot or dab the wound dry. Do not take baths, use swimming pools, or use hot tubs for 10 days, or as directed by your caregiver.  Only take over-the-counter or prescription medicines for pain, discomfort, or fever as directed by your caregiver.  Continue your normal diet as directed.  Do not drive until your caregiver says it is okay.  Do not lift anything more than 10 pounds or play contact sports for 3 weeks, or as directed.  Make an appointment to see your caregiver for stitches (sutures) or staple removal when instructed. SEEK MEDICAL CARE IF:   You have increased bleeding coming from the wounds.  You have blood in your stool.  You see redness, swelling, or have increasing pain in the wounds.  You have fluid (pus) coming from the wound.  You have an oral temperature above 102 F (38.9 C).  You notice a bad smell coming from the wound or dressing.  You develop lightheadedness or feel faint. SEEK IMMEDIATE MEDICAL CARE IF:   You  develop a rash.  You have difficulty breathing.  You develop any reaction or side effects to medicines given. MAKE SURE YOU:   Understand these instructions.  Will watch your condition.  Will get help right away if you are not doing well or get worse. Document Released: 03/28/2005 Document Revised: 12/01/2011 Document Reviewed: 08/08/2009 [employer] Patient Information 2014 [employer]. Hernia A hernia occurs when an internal organ pushes out through a weak spot in the abdominal wall. Hernias most commonly occur in the groin and around the navel. Hernias often can be pushed back into place (reduced). Most hernias tend to get worse over time. Some abdominal hernias can get stuck in the opening (irreducible or incarcerated hernia) and cannot be reduced. An irreducible abdominal hernia which is tightly squeezed into the opening is at risk for impaired blood supply (strangulated hernia). A strangulated hernia is a medical emergency. Because of the risk for an irreducible or strangulated hernia, surgery may be recommended to repair a hernia. CAUSES   Heavy lifting.  Prolonged coughing.  Straining to have a bowel movement.  A cut (incision) made during an abdominal surgery. HOME CARE INSTRUCTIONS   Bed rest is not required. You may continue your normal activities.  Avoid lifting more than 10 pounds (4.5 kg) or straining.  Cough gently. If you are a smoker it is best to stop. Even the best hernia repair can break down with the continual strain of coughing. Even if you do not have your   hernia repaired, a cough will continue to aggravate the problem.  Do not wear anything tight over your hernia. Do not try to keep it in with an outside bandage or truss. These can damage abdominal contents if they are trapped within the hernia sac.  Eat a normal diet.  Avoid constipation. Straining over long periods of time will increase hernia size and encourage breakdown of repairs. If you cannot do  this with diet alone, stool softeners may be used. SEEK IMMEDIATE MEDICAL CARE IF:   You have a fever.  You develop increasing abdominal pain.  You feel nauseous or vomit.  Your hernia is stuck outside the abdomen, looks discolored, feels hard, or is tender.  You have any changes in your bowel habits or in the hernia that are unusual for you.  You have increased pain or swelling around the hernia.  You cannot push the hernia back in place by applying gentle pressure while lying down. MAKE SURE YOU:   Understand these instructions.  Will watch your condition.  Will get help right away if you are not doing well or get worse. Document Released: 09/08/2005 Document Revised: 12/01/2011 Document Reviewed: 04/27/2008 [employer] Patient Information 2014 [employer].  

## 2013-04-28 NOTE — Telephone Encounter (Signed)
According to last OV note:  sounds really minimal, but he would like to have a rescue inhaler available        Rx was never sent. I have sent rx to pharmacy and pt is aware. Carron Curie, CMA

## 2013-04-28 NOTE — Telephone Encounter (Signed)
Patient met with surgery scheduling went over financial responsibility, patient will call back to schedule, orders pending file.

## 2013-05-15 ENCOUNTER — Encounter: Payer: Self-pay | Admitting: Internal Medicine

## 2013-05-16 MED ORDER — ALBUTEROL SULFATE HFA 108 (90 BASE) MCG/ACT IN AERS: 2.0000 | INHALATION_SPRAY | Freq: Four times a day (QID) | RESPIRATORY_TRACT | Status: DC | PRN

## 2013-05-16 NOTE — Telephone Encounter (Signed)
I spoke with the pt and he states that albuterol inhaler was $55 and he wanted to know if there was an alternative. I advised we could send in proair to see if this is any cheaper for him but that there is not a generic option for this type of inhaler. Pt states understanding and would like for proair to be sent. Rx sent. Carron Curie, CMA

## 2013-05-19 ENCOUNTER — Other Ambulatory Visit: Payer: Self-pay | Admitting: Internal Medicine

## 2013-05-25 ENCOUNTER — Telehealth: Payer: Self-pay | Admitting: Internal Medicine

## 2013-05-25 MED ORDER — AMPHETAMINE-DEXTROAMPHET ER 15 MG PO CP24: 15.0000 mg | ORAL_CAPSULE | ORAL | Status: DC

## 2013-05-25 NOTE — Telephone Encounter (Signed)
Pt request refill for adderall. Please advise.  °

## 2013-05-25 NOTE — Telephone Encounter (Signed)
ok 

## 2013-05-26 NOTE — Telephone Encounter (Signed)
Notified pt rx ready for pick-up.../lmb 

## 2013-05-27 ENCOUNTER — Ambulatory Visit (INDEPENDENT_AMBULATORY_CARE_PROVIDER_SITE_OTHER)
Admission: RE | Admit: 2013-05-27 | Discharge: 2013-05-27 | Disposition: A | Discharge status: Home / Self Care | Payer: BC Managed Care – PPO | Source: Ambulatory Visit | Attending: Internal Medicine | Admitting: Internal Medicine

## 2013-05-27 ENCOUNTER — Encounter: Payer: Self-pay | Admitting: *Deleted

## 2013-05-27 ENCOUNTER — Ambulatory Visit: Payer: BC Managed Care – PPO | Admitting: Internal Medicine

## 2013-05-27 ENCOUNTER — Ambulatory Visit (INDEPENDENT_AMBULATORY_CARE_PROVIDER_SITE_OTHER): Payer: BC Managed Care – PPO | Admitting: Internal Medicine

## 2013-05-27 ENCOUNTER — Encounter: Payer: Self-pay | Admitting: Internal Medicine

## 2013-05-27 VITALS — BP 120/82 | HR 66 | Temp 98.1°F | Ht 66.3 in | Wt 175.0 lb

## 2013-05-27 DIAGNOSIS — R079 Chest pain, unspecified: Secondary | ICD-10-CM

## 2013-05-27 DIAGNOSIS — R0789 Other chest pain: Secondary | ICD-10-CM

## 2013-05-27 DIAGNOSIS — Z23 Encounter for immunization: Secondary | ICD-10-CM

## 2013-05-27 DIAGNOSIS — J309 Allergic rhinitis, unspecified: Secondary | ICD-10-CM | POA: Insufficient documentation

## 2013-05-27 DIAGNOSIS — F411 Generalized anxiety disorder: Secondary | ICD-10-CM

## 2013-05-27 DIAGNOSIS — I10 Essential (primary) hypertension: Secondary | ICD-10-CM

## 2013-05-27 MED ORDER — ALPRAZOLAM 0.5 MG PO TABS: 0.5000 mg | ORAL_TABLET | Freq: Every evening | ORAL | Status: DC | PRN

## 2013-05-27 MED ORDER — INDOMETHACIN 25 MG PO CAPS: 25.0000 mg | ORAL_CAPSULE | Freq: Three times a day (TID) | ORAL | Status: DC

## 2013-05-27 MED ORDER — AZELASTINE HCL 0.1 % NA SOLN: 2.0000 | Freq: Two times a day (BID) | NASAL | Status: DC

## 2013-05-27 MED ORDER — LISINOPRIL-HYDROCHLOROTHIAZIDE 20-25 MG PO TABS: 0.5000 | ORAL_TABLET | Freq: Every day | ORAL | Status: DC

## 2013-05-27 NOTE — Assessment & Plan Note (Signed)
Long history of same, previously under psychiatry care until 2010 Reports previous trials of Paxil, Zoloft ineffective Previously tried on Cymbalta, Abilify, mood stabilizer and Adderall in past - but reports stopping all in 2010 because "doses were too high" increasing anxiety symptoms reviewed, manifest with excessive worry, distractibility and irritability repeat discussion regarding medication options and behavioral health therapies for treatment of same Tried resumed Cymbalta 30 mg qd, low-dose Adderall XR and increase alprazolam dose (0.5 mg twice a day when necessary) 12/16/12  Intol of cymbalta side effects - does not wish to resume "antidepressant" at this time 01/2013 increased Adderall xr to 15mg  - s/p single evaluation bu Dr Noe Gens for counseling 12/2012 - no plans for follow up  Verified no SI/HI, pt will call if problems unimproved with med changes or if symptoms worse

## 2013-05-27 NOTE — Progress Notes (Signed)
Subjective:    Patient ID: Tony [name], male    DOB: 11/02/1975, 37 y.o.   MRN: [id number]  Chest Pain  This is a new problem. The current episode started more than 1 month ago. The onset quality is gradual. The problem occurs daily. The problem has been waxing and waning. The pain is present in the substernal region. The pain is moderate. The quality of the pain is described as stabbing and sharp. The pain does not radiate. Pertinent negatives include no abdominal pain, cough, dizziness, fever, headaches, irregular heartbeat, nausea, near-syncope, palpitations, shortness of breath, syncope or vomiting.  His past medical history is significant for anxiety/panic attacks.  Pertinent negatives for past medical history include no CAD, no cancer and no COPD.  Denies injury such as directed, or motor vehicle accident. Denies prior history of same. Not improved with occasional over-the-counter anti-inflammatory or Tylenol   Also concerned about increased allergy symptoms, would like to begin Astelin nose spray  Also concerned about over treatment of blood pressure given weight loss, home blood pressure cuff with systolic pressure in 90s. Denies presyncope or dizziness symptoms    Past Medical History  Diagnosis Date  . Asthma   . Anxiety     overlap with depression and ADD  . Hypertension   . Hepatitis A   . Migraines   . OSA on CPAP     Sleep study 12/2011: severe, CPAP  . Allergic rhinitis   . Migraine     Review of Systems  Constitutional: Negative for fever and fatigue.  Respiratory: Negative for cough and shortness of breath.   Cardiovascular: Positive for chest pain. Negative for palpitations, leg swelling, syncope and near-syncope.  Gastrointestinal: Negative for nausea, vomiting, abdominal pain, diarrhea and constipation.  Neurological: Negative for dizziness and headaches.  Psychiatric/Behavioral: Negative for dysphoric mood and decreased concentration.        Objective:   Physical Exam BP 120/82  Pulse 66  Temp(Src) 98.1 F (36.7 C) (Oral)  Ht 5' 6.3" (1.684 m)  Wt 175 lb (79.379 kg)  BMI 27.99 kg/m2  SpO2 97% Wt Readings from Last 3 Encounters:  05/27/13 175 lb (79.379 kg)  04/28/13 185 lb 6.4 oz (84.097 kg)  04/14/13 184 lb 12.8 oz (83.825 kg)   Constitutional: he is overweight, but appears well-developed and well-nourished. No distress.   Chest: reproducible pain with palpation of the xiphoid process Cardiovascular: Normal rate, regular rhythm and normal heart sounds.  No murmur heard. No BLE edema. Pulmonary/Chest: Effort normal and breath sounds normal. No respiratory distress. he has no wheezes.  Psychiatric: he has a minimally anxious mood and affect. behavior is normal. Judgment and thought content normal.   Lab Results  Component Value Date   WBC 4.9 12/16/2012   HGB 15.5 12/16/2012   HCT 45.5 12/16/2012   PLT 329.0 12/16/2012   GLUCOSE 101* 12/16/2012   CHOL 218* 12/16/2012   TRIG 108.0 12/16/2012   HDL 53.10 12/16/2012   LDLDIRECT 144.5 12/16/2012   LDLCALC 110 05/21/2011   ALT 68* 12/16/2012   AST 32 12/16/2012   NA 132* 12/16/2012   K 4.5 12/16/2012   CL 97 12/16/2012   CREATININE 1.3 12/16/2012   BUN 16 12/16/2012   CO2 26 12/16/2012   TSH 2.21 12/16/2012      Assessment & Plan:   Atypical chest pain - ECG today NSR, no ischemic change or arrythmia -  Hx and exam consistent with Mskel cause (xiphoid inflammation) Check chest x-ray  now Rx NSAIDs x 2 weeks, then prn Education on diagnosis provided Patient will call if symptoms worse or unimproved  Work excuse note provided  Time spent with pt today 40 minutes, greater than 50% time spent counseling patient on Xiphoid pain, hypertension, allergic rhinitis, anxiety and medication review. Also review of prior records    See problem list. Medications and labs reviewed today.

## 2013-05-27 NOTE — Assessment & Plan Note (Signed)
add Astelin - erx done

## 2013-05-27 NOTE — Assessment & Plan Note (Signed)
BP Readings from Last 3 Encounters:  05/27/13 120/82  04/28/13 112/70  04/14/13 120/84   Added diuretic to ongoing ACE inhibitor 11/2012 Improving with weight loss, ok to reduce dose by 1/2 now

## 2013-05-27 NOTE — Patient Instructions (Signed)
It was good to see you today. We have reviewed your prior records including labs and tests today ECG today normal, no evidence of heart problems causing chest pain Test(s) ordered today -chest x-ray. Your results will be released to MyChart (or called to you) after review, usually within 72hours after test completion. If any changes need to be made, you will be notified at that same time. Begin Indocin 25 mg 3 times daily for 2 weeks, then as needed for pain -  Begin Astelin spray for allergies Refill on Xanax Reduce blood pressure tablet by half Your prescription(s) have been submitted to your pharmacy. Please take as directed and contact our office if you believe you are having problem(s) with the medication(s).

## 2013-06-14 ENCOUNTER — Ambulatory Visit (INDEPENDENT_AMBULATORY_CARE_PROVIDER_SITE_OTHER): Payer: BC Managed Care – PPO | Admitting: Internal Medicine

## 2013-06-14 ENCOUNTER — Encounter: Payer: Self-pay | Admitting: Internal Medicine

## 2013-06-14 VITALS — BP 112/84 | HR 78 | Temp 98.2°F | Ht 66.5 in | Wt 176.2 lb

## 2013-06-14 DIAGNOSIS — R062 Wheezing: Secondary | ICD-10-CM

## 2013-06-14 DIAGNOSIS — J209 Acute bronchitis, unspecified: Secondary | ICD-10-CM

## 2013-06-14 DIAGNOSIS — I10 Essential (primary) hypertension: Secondary | ICD-10-CM

## 2013-06-14 MED ORDER — LEVOFLOXACIN 500 MG PO TABS: 500.0000 mg | ORAL_TABLET | Freq: Every day | ORAL | Status: DC

## 2013-06-14 MED ORDER — PREDNISONE 10 MG PO TABS: ORAL_TABLET | ORAL | Status: DC

## 2013-06-14 MED ORDER — HYDROCODONE-HOMATROPINE 5-1.5 MG/5ML PO SYRP: 5.0000 mL | ORAL_SOLUTION | Freq: Four times a day (QID) | ORAL | Status: DC | PRN

## 2013-06-14 NOTE — Assessment & Plan Note (Signed)
Mild to mod, for antibx course,  to f/u any worsening symptoms or concerns 

## 2013-06-14 NOTE — Assessment & Plan Note (Signed)
stable overall by history and exam, recent data reviewed with pt, and pt to continue medical treatment as before,  to f/u any worsening symptoms or concerns BP Readings from Last 3 Encounters:  06/14/13 112/84  05/27/13 120/82  04/28/13 112/70

## 2013-06-14 NOTE — Assessment & Plan Note (Signed)
Mild, for low dose short course predpac asd,  to f/u any worsening symptoms or concerns

## 2013-06-14 NOTE — Patient Instructions (Signed)
Please take all new medication as prescribed - the levaquin, prednisone, and cough medicine Please continue all other medications as before, including the inhaler  Please have the pharmacy call with any other refills you may need.

## 2013-06-14 NOTE — Progress Notes (Signed)
Subjective:    Patient ID: Tony [name], male    DOB: 1976-02-04, 37 y.o.   MRN: [id number]  HPI  Here with acute onset mild to mod 2-3 days ST, HA, general weakness and malaise, with prod cough greenish sputum, but Pt denies chest pain, increased sob or doe, wheezing, orthopnea, PND, increased LE swelling, palpitations, dizziness or syncope, except for mild wheezing onset last PM, with fatigue and chest tightness and harder to take deep breaths. Past Medical History  Diagnosis Date  . Asthma   . Anxiety     overlap with depression and ADD  . Hypertension   . Hepatitis A   . Migraines   . OSA on CPAP     Sleep study 12/2011: severe, CPAP  . Allergic rhinitis   . Migraine    Past Surgical History  Procedure Laterality Date  . Lasik  2005  . Wisdom tooth extraction      reports that he quit smoking about 4 years ago. His smoking use included Cigarettes. He has a .72 pack-year smoking history. He does not have any smokeless tobacco history on file. He reports that  drinks alcohol. He reports that he does not use illicit drugs. family history includes Arthritis in his father; Asthma in his paternal grandfather; Diabetes in his other; Emphysema in his paternal grandfather; Hypertension in his father and mother. Allergies  Allergen Reactions  . Erythromycin     Pt states med make him sick   Current Outpatient Prescriptions on File Prior to Visit  Medication Sig Dispense Refill  . acyclovir ointment (ZOVIRAX) 5 % Apply topically every 3 (three) hours.  15 g  1  . albuterol (PROAIR HFA) 108 (90 BASE) MCG/ACT inhaler Inhale 2 puffs into the lungs every 6 (six) hours as needed for wheezing.  1 Inhaler  3  . ALPRAZolam (XANAX) 0.5 MG tablet Take 1 tablet (0.5 mg total) by mouth at bedtime as needed for sleep or anxiety.  60 tablet  2  . amphetamine-dextroamphetamine (ADDERALL XR) 15 MG 24 hr capsule Take 1 capsule (15 mg total) by mouth every morning.  30 capsule  0  . azelastine  (ASTELIN) 137 MCG/SPRAY nasal spray Place 2 sprays into the nose 2 (two) times daily. Use in each nostril as directed  30 mL  2  . cetirizine (ZYRTEC) 10 MG tablet Take 1 tablet (10 mg total) by mouth daily.  30 tablet  11  . fluticasone (FLONASE) 50 MCG/ACT nasal spray Place 2 sprays into the nose daily.  16 g  2  . indomethacin (INDOCIN) 25 MG capsule Take 1 capsule (25 mg total) by mouth 3 (three) times daily with meals.  60 capsule  0  . lisinopril-hydrochlorothiazide (PRINZIDE,ZESTORETIC) 20-25 MG per tablet Take 0.5 tablets by mouth daily.  90 tablet  3  . SUMAtriptan (IMITREX) 50 MG tablet Take 1 tablet (50 mg total) by mouth once as needed for migraine.  12 tablet  1   No current facility-administered medications on file prior to visit.   Review of Systems  Constitutional: Negative for unexpected weight change, or unusual diaphoresis  HENT: Negative for tinnitus.   Eyes: Negative for photophobia and visual disturbance.  Respiratory: Negative for choking and stridor.   Gastrointestinal: Negative for vomiting and blood in stool.  Genitourinary: Negative for hematuria and decreased urine volume.  Musculoskeletal: Negative for acute joint swelling Skin: Negative for color change and wound.  Neurological: Negative for tremors and numbness other than noted  Psychiatric/Behavioral: Negative for decreased concentration or  hyperactivity.       Objective:   Physical Exam BP 112/84  Pulse 78  Temp(Src) 98.2 F (36.8 C) (Oral)  Ht 5' 6.5" (1.689 m)  Wt 176 lb 4 oz (79.946 kg)  BMI 28.02 kg/m2  SpO2 98% VS noted, mild ill Constitutional: Pt appears well-developed and well-nourished.  HENT: Head: NCAT.  Right Ear: External ear normal.  Left Ear: External ear normal.  Bilat tm's with mild erythema.  Max sinus areas mild tender.  Pharynx with erythema, no exudate Eyes: Conjunctivae and EOM are normal. Pupils are equal, round, and reactive to light.  Neck: Normal range of motion. Neck  supple.  Cardiovascular: Normal rate and regular rhythm.   Pulmonary/Chest: Effort normal and breath sounds decresed with bilat few wheezes  Neurological: Pt is alert. Not confused  Skin: Skin is warm. No erythema.  Psychiatric: Pt behavior is normal. Thought content normal.     Assessment & Plan:

## 2013-06-15 ENCOUNTER — Encounter: Payer: Self-pay | Admitting: Internal Medicine

## 2013-06-15 ENCOUNTER — Telehealth: Payer: Self-pay | Admitting: Internal Medicine

## 2013-06-15 NOTE — Telephone Encounter (Signed)
Received this message through MyChart:  Can someone please give me a call at [phone] to answer a few questions about my upcoming allergy testing? Thank You!  Spoke to the pt. States that she saw Dr. Jonny Ruiz yesterday and was diagnosed with Bronchitis. Was given Levaquin and Prednisone. Dr. Jonny Ruiz told her that Prednisone could interfere with the allergy testing that she is going to have on 06/22/13.  She asks to speak to [facility] or Dr. Maple Hudson in regards to this.

## 2013-06-16 NOTE — Telephone Encounter (Signed)
Would like her to be on no more than 10 mg prednisone daily on the day before and day of skin testing. Ok to have her reduce to that dose for those days.

## 2013-06-17 NOTE — Telephone Encounter (Signed)
I spoke with pt and is aware of CDY recs. He needed nothing further

## 2013-06-20 ENCOUNTER — Telehealth: Payer: Self-pay | Admitting: Internal Medicine

## 2013-06-20 NOTE — Telephone Encounter (Signed)
Per CY-let's have patient reschedule skin testing. Thanks.

## 2013-06-20 NOTE — Telephone Encounter (Signed)
Pt is currently on these medications. Per Katie send to her and she will ask Dr. Maple Hudson

## 2013-06-20 NOTE — Telephone Encounter (Signed)
Pt aware and appt r/s. Nothing further needed

## 2013-06-22 ENCOUNTER — Ambulatory Visit: Payer: BC Managed Care – PPO | Admitting: Internal Medicine

## 2013-06-23 ENCOUNTER — Other Ambulatory Visit: Payer: Self-pay | Admitting: Internal Medicine

## 2013-06-24 ENCOUNTER — Other Ambulatory Visit: Payer: Self-pay | Admitting: *Deleted

## 2013-06-24 MED ORDER — INDOMETHACIN 25 MG PO CAPS: 25.0000 mg | ORAL_CAPSULE | Freq: Three times a day (TID) | ORAL | Status: DC

## 2013-06-24 NOTE — Telephone Encounter (Signed)
Sent email needing refill on her indomethacin...lmb

## 2013-06-28 ENCOUNTER — Encounter: Payer: Self-pay | Admitting: Internal Medicine

## 2013-06-28 MED ORDER — HYDROCODONE-HOMATROPINE 5-1.5 MG/5ML PO SYRP: 5.0000 mL | ORAL_SOLUTION | Freq: Four times a day (QID) | ORAL | Status: DC | PRN

## 2013-06-28 MED ORDER — LEVOFLOXACIN 500 MG PO TABS: 500.0000 mg | ORAL_TABLET | Freq: Every day | ORAL | Status: DC

## 2013-06-28 NOTE — Addendum Note (Signed)
Addended by: Rene Paci A on: 06/28/2013 03:34 PM   Modules accepted: Orders

## 2013-07-11 ENCOUNTER — Other Ambulatory Visit: Payer: Self-pay | Admitting: *Deleted

## 2013-07-11 MED ORDER — AMPHETAMINE-DEXTROAMPHET ER 15 MG PO CP24: 15.0000 mg | ORAL_CAPSULE | ORAL | Status: DC

## 2013-07-11 NOTE — Telephone Encounter (Signed)
Notified pt rx ready for pick-up.../lmb 

## 2013-07-11 NOTE — Telephone Encounter (Signed)
Left msg on vm requesting refill on his adderal.../lmb

## 2013-07-12 ENCOUNTER — Other Ambulatory Visit: Payer: Self-pay | Admitting: Internal Medicine

## 2013-08-22 ENCOUNTER — Other Ambulatory Visit: Payer: Self-pay | Admitting: Internal Medicine

## 2013-08-23 MED ORDER — AMPHETAMINE-DEXTROAMPHET ER 15 MG PO CP24: 15.0000 mg | ORAL_CAPSULE | ORAL | Status: DC

## 2013-08-23 NOTE — Telephone Encounter (Signed)
Please print and i will sign thanks

## 2013-08-24 ENCOUNTER — Ambulatory Visit (INDEPENDENT_AMBULATORY_CARE_PROVIDER_SITE_OTHER): Payer: BC Managed Care – PPO | Admitting: Internal Medicine

## 2013-08-24 ENCOUNTER — Encounter: Payer: Self-pay | Admitting: Internal Medicine

## 2013-08-24 VITALS — BP 118/64 | HR 94 | Ht 67.0 in | Wt 175.8 lb

## 2013-08-24 DIAGNOSIS — J302 Other seasonal allergic rhinitis: Secondary | ICD-10-CM

## 2013-08-24 DIAGNOSIS — J309 Allergic rhinitis, unspecified: Secondary | ICD-10-CM

## 2013-08-24 DIAGNOSIS — J4520 Mild intermittent asthma, uncomplicated: Secondary | ICD-10-CM

## 2013-08-24 DIAGNOSIS — J45909 Unspecified asthma, uncomplicated: Secondary | ICD-10-CM

## 2013-08-24 NOTE — Progress Notes (Signed)
Subjective:    Patient ID: Tony [name], male    DOB: 07-Jul-1976, 37 y.o.   MRN: [id number]  HPI 37 yo M former smoker with hx OSA/ CPAP, allergic rhinitis Referred by Dr Felicity Coyer for allergy testing. Allergy [facility], [place], [place] Describes lifelong seasonal and perennial allergic rhinitis. Followed by an ENT/allergy group in [place]. Last allergy skin testing was a long time ago-positive for mold, grass and common allergens. Allergy vaccine did help at that time but he quit after a year. Now wakes up with nasal congestion. Worse if visiting [place] rather than here and worse in spring, best in winter. Takes daily Zyrtec and Flonase. Little asthma after childhood.. No history of skin, food or insect sensitivities. NPSG 01/19/12- AHI 49.0/ hr, Weight 186 lbs. CPAP titrated to 10 cwp.  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 Parents living, father with asthma, nobody known to have OSA.   08/24/13- 48 yo M former smoker with hx OSA/ CPAP, allergic rhinitis Allergy Profile 03/18/13- Total IgE 134.9 POS for house dust/ mite, dog, cat, grass No antihistamines, OTC cough syrups, or OTC sleep aids in past 3 days Years ago had rapid heart beat with skin testing- sounds like possibly vaso-vagal. Today no infection, but notes more head congestion off antihistamine.  CPAP 10/? DME  doing well Allergy Skin test 08/24/13- Positive for dust mite, grass/weed/tree pollens Reports being uncomfortable in head and chest while off of antihistamines for skin testing, but no wheeze.  Spring season is worst. He wants to check price of allergy vaccine before considering whether to try allergy shots.  ROS-see HPI Constitutional:   No-   weight loss, night sweats, fevers, chills, fatigue, lassitude. HEENT:   No-  headaches, difficulty swallowing, tooth/dental problems, sore throat,       No-  sneezing, itching, ear ache, +nasal congestion, post nasal drip,  CV:  No-   chest  pain, orthopnea, PND, swelling in lower extremities, anasarca, dizziness, palpitations Resp: No-   shortness of breath with exertion or at rest.              No-   productive cough,  No non-productive cough,  No- coughing up of blood.              No-   change in color of mucus.  No- wheezing.   Skin: No-   rash or lesions. GI:  No-   heartburn, indigestion, abdominal pain, nausea, vomiting,  GU:  MS:  No-   joint pain or swelling.   Neuro-     nothing unusual Psych:  No- change in mood or affect. No depression or anxiety.  No memory loss.      Objective:   Physical Exam OBJ- Physical Exam General- Alert, Oriented, Affect-appropriate, Distress- none acute. Mild over-weight Skin- rash-none, lesions- none, excoriation- none Lymphadenopathy- none Head- atraumatic            Eyes- Gross vision intact, PERRLA, conjunctivae and secretions clear            Ears- Hearing, canals-normal            Nose- Clear, no-Septal dev, mucus, polyps, erosion, perforation             Throat- Mallampati II-III , mucosa clear , drainage- none, tonsils- atrophic Neck- flexible , trachea midline, no stridor , thyroid nl, carotid no bruit Chest - symmetrical excursion , unlabored           Heart/CV- RRR ,  no murmur , no gallop  , no rub, nl s1 s2                           - JVD- none , edema- none, stasis changes- none, varices- none           Lung- clear to P&A, wheeze- none, cough- none , dullness-none, rub- none           Chest wall-  Abd-  Br/ Gen/ Rectal- Not done, not indicated Extrem- cyanosis- none, clubbing, none, atrophy- none, strength- nl Neuro- grossly intact to observation     Assessment & Plan:

## 2013-08-24 NOTE — Patient Instructions (Signed)
Your strongest allergy skin test results were for grass pollens, house dust mite, and mold  You can check with your insurance about their coverage for allergy shots. We would be able to make an allergy vaccine for you- you can let us know if you choose to start.   Please call as needed

## 2013-09-11 NOTE — Assessment & Plan Note (Signed)
Seasonal and perennial allergic rhinitis Allergy profile 03/18/2013-total IgE 134.9 with significant elevations for dust mite, cat and dog, grass pollen Allergy Skin test 08/24/13- Positive for dust mite, grass/weed/tree pollens Plan-he is going to check on thecost of allergy vaccine with his insurance and consider whether that is an option he is interested in. We emphasized environmental precautions and protection especially of the bedroom. He will return in the spring.

## 2013-09-16 ENCOUNTER — Encounter: Payer: Self-pay | Admitting: Internal Medicine

## 2013-09-27 ENCOUNTER — Other Ambulatory Visit: Payer: Self-pay | Admitting: Internal Medicine

## 2013-09-27 ENCOUNTER — Telehealth: Payer: Self-pay | Admitting: Internal Medicine

## 2013-09-28 MED ORDER — AMPHETAMINE-DEXTROAMPHET ER 15 MG PO CP24: 15.0000 mg | ORAL_CAPSULE | ORAL | Status: DC

## 2013-09-29 NOTE — Telephone Encounter (Signed)
Place rx in cabinet for pt to pick=up...Raechel Chute/lmb

## 2013-11-15 ENCOUNTER — Other Ambulatory Visit: Payer: Self-pay | Admitting: Internal Medicine

## 2013-11-16 ENCOUNTER — Encounter: Payer: Self-pay | Admitting: *Deleted

## 2013-11-16 MED ORDER — AMPHETAMINE-DEXTROAMPHET ER 15 MG PO CP24: 15.0000 mg | ORAL_CAPSULE | ORAL | Status: DC

## 2014-01-09 ENCOUNTER — Other Ambulatory Visit: Payer: Self-pay | Admitting: Internal Medicine

## 2014-01-10 ENCOUNTER — Encounter: Payer: Self-pay | Admitting: Internal Medicine

## 2014-01-10 ENCOUNTER — Ambulatory Visit (INDEPENDENT_AMBULATORY_CARE_PROVIDER_SITE_OTHER): Payer: BC Managed Care – PPO | Admitting: Internal Medicine

## 2014-01-10 VITALS — BP 120/82 | HR 74 | Temp 97.7°F | Ht 67.0 in | Wt 177.8 lb

## 2014-01-10 DIAGNOSIS — E663 Overweight: Secondary | ICD-10-CM

## 2014-01-10 DIAGNOSIS — I10 Essential (primary) hypertension: Secondary | ICD-10-CM

## 2014-01-10 DIAGNOSIS — M722 Plantar fascial fibromatosis: Secondary | ICD-10-CM

## 2014-01-10 DIAGNOSIS — F988 Other specified behavioral and emotional disorders with onset usually occurring in childhood and adolescence: Secondary | ICD-10-CM

## 2014-01-10 MED ORDER — LORCASERIN HCL 10 MG PO TABS: 10.0000 mg | ORAL_TABLET | Freq: Two times a day (BID) | ORAL | Status: DC

## 2014-01-10 NOTE — Patient Instructions (Signed)
It was good to see you today.  We have reviewed your prior records including labs and tests today  Medications reviewed and updated Will try Belviq for weight loss assistance - no other changes recommended at this time. Your prescription(s) have been given to you to submit to your pharmacy. Please take as directed and contact our office if you believe you are having problem(s) with the medication(s).  Please schedule followup in 4-6 weeks after beginning medication for weight check, call sooner if problems.

## 2014-01-10 NOTE — Assessment & Plan Note (Signed)
Wt Readings from Last 3 Encounters:  01/10/14 177 lb 12.8 oz (80.65 kg)  08/24/13 175 lb 12.8 oz (79.742 kg)  06/14/13 176 lb 4 oz (79.946 kg)   Intentional 30 pound weight loss since May 2014 reviewed -  now BMI<30 but has plateau'ed Given co-dx HTN and OSA, ok for weight loss assistance med Co-tx with adderal prohibits stimulant or contrave but belviq safe  we reviewed potential risk/benefit and possible side effects - pt understands and agrees to same  Encouraged ongoing exercise and diet efforts

## 2014-01-10 NOTE — Telephone Encounter (Signed)
Faxed script back to [facility].../lmb 

## 2014-01-10 NOTE — Progress Notes (Signed)
Subjective:    Patient ID: Tony LudwigStacy Jane, male    DOB: 12-24-1975, 38 y.o.   MRN: [id number]  HPI  Patient here for follow up - ?med assist with wt loss Reviewed chronic medical issues and interval medical events  Past Medical History  Diagnosis Date  . Asthma   . Anxiety     overlap with depression and ADD  . Hypertension   . Hepatitis A   . Migraines   . OSA on CPAP     Sleep study 12/2011: severe, CPAP 10mmHg  . Allergic rhinitis   . Migraine     Review of Systems  Constitutional: Negative for fever, fatigue and unexpected weight change.  Respiratory: Negative for cough and shortness of breath.   Cardiovascular: Negative for chest pain and leg swelling.  Musculoskeletal:       L heel pain x 3 weeks, no injury - worse 1st steps in AM and getting out of car       Objective:   Physical Exam  BP 120/82  Pulse 74  Temp(Src) 97.7 F (36.5 C) (Oral)  Ht 5\' 7"  (1.702 m)  Wt 177 lb 12.8 oz (80.65 kg)  BMI 27.84 kg/m2  SpO2 99% Wt Readings from Last 3 Encounters:  01/10/14 177 lb 12.8 oz (80.65 kg)  08/24/13 175 lb 12.8 oz (79.742 kg)  06/14/13 176 lb 4 oz (79.946 kg)   Constitutional: he is overweight, but appears well-developed and well-nourished. No distress.  Neck: Normal range of motion. Neck supple. No JVD present. No thyromegaly present.  Cardiovascular: Normal rate, regular rhythm and normal heart sounds.  No murmur heard. No BLE edema. Pulmonary/Chest: Effort normal and breath sounds normal. No respiratory distress. he has no wheezes.  Musculoskeletal: left foot without gross deformity. Heel pad skin benign without soft tissue swelling. Tender over the plantar insertion at heel -ligamentous function intact Psychiatric: he has a normal mood and affect. His behavior is normal. Judgment and thought content normal.   Lab Results  Component Value Date   WBC 4.9 12/16/2012   HGB 15.5 12/16/2012   HCT 45.5 12/16/2012   PLT 329.0 12/16/2012   GLUCOSE 101* 12/16/2012     CHOL 218* 12/16/2012   TRIG 108.0 12/16/2012   HDL 53.10 12/16/2012   LDLDIRECT 144.5 12/16/2012   LDLCALC 110 05/21/2011   ALT 68* 12/16/2012   AST 32 12/16/2012   NA 132* 12/16/2012   K 4.5 12/16/2012   CL 97 12/16/2012   CREATININE 1.3 12/16/2012   BUN 16 12/16/2012   CO2 26 12/16/2012   TSH 2.21 12/16/2012    Dg Chest 2 View  05/27/2013   CLINICAL DATA:  Chest pain.  EXAM: CHEST  2 VIEW  COMPARISON:  None.  FINDINGS: The heart size and mediastinal contours are within normal limits. Both lungs are clear. The visualized skeletal structures are unremarkable.  IMPRESSION: No active cardiopulmonary disease.   Electronically Signed   By: Charlett NoseKevin  Dover M.D.   On: 05/27/2013 10:13       Assessment & Plan:   Problem List Items Addressed This Visit   ADD (attention deficit disorder)     Increased Adderall xr to 15mg  qd  01/2013, doing well Refill today - rx done    Overweight - Primary      Wt Readings from Last 3 Encounters:  01/10/14 177 lb 12.8 oz (80.65 kg)  08/24/13 175 lb 12.8 oz (79.742 kg)  06/14/13 176 lb 4 oz (79.946 kg)  Intentional 30 pound weight loss since May 2014 reviewed -  now BMI<30 but has plateau'ed Given co-dx HTN and OSA, ok for weight loss assistance med Co-tx with adderal prohibits stimulant or contrave but belviq safe  we reviewed potential risk/benefit and possible side effects - pt understands and agrees to same  Encouraged ongoing exercise and diet efforts      Unspecified essential hypertension      BP Readings from Last 3 Encounters:  01/10/14 120/82  08/24/13 118/64  06/14/13 112/84   Added diuretic to ongoing ACE inhibitor 11/2012 Improving with weight loss, so reduced dose summer 2014 The current medical regimen is effective;  continue present plan and medications.       Plantar fasciitis. Education diagnosis provided. Patient will ice, wear nocturnal splint and use over-the-counter anti-inflammatory as needed. Also advised on importance of  supportive shoe wear. If symptoms worse or unimproved, patient will call for referral to sports medicine for further treatment as needed

## 2014-01-10 NOTE — Progress Notes (Signed)
Pre visit review using our clinic review tool, if applicable. No additional management support is needed unless otherwise documented below in the visit note. 

## 2014-01-10 NOTE — Assessment & Plan Note (Signed)
BP Readings from Last 3 Encounters:  01/10/14 120/82  08/24/13 118/64  06/14/13 112/84   Added diuretic to ongoing ACE inhibitor 11/2012 Improving with weight loss, so reduced dose summer 2014 The current medical regimen is effective;  continue present plan and medications.

## 2014-01-10 NOTE — Assessment & Plan Note (Signed)
Increased Adderall xr to 15mg  qd  01/2013, doing well Refill today - rx done

## 2014-01-11 ENCOUNTER — Telehealth: Payer: Self-pay | Admitting: Internal Medicine

## 2014-01-11 NOTE — Telephone Encounter (Signed)
Relevant patient education assigned to patient using Emmi. ° °

## 2014-01-13 ENCOUNTER — Encounter: Payer: Self-pay | Admitting: Internal Medicine

## 2014-01-17 ENCOUNTER — Other Ambulatory Visit: Payer: Self-pay | Admitting: Internal Medicine

## 2014-01-21 ENCOUNTER — Telehealth: Payer: Self-pay | Admitting: Internal Medicine

## 2014-01-21 ENCOUNTER — Other Ambulatory Visit: Payer: Self-pay | Admitting: Internal Medicine

## 2014-01-23 MED ORDER — AMPHETAMINE-DEXTROAMPHET ER 15 MG PO CP24: 15.0000 mg | ORAL_CAPSULE | ORAL | Status: DC

## 2014-01-24 ENCOUNTER — Other Ambulatory Visit: Payer: Self-pay | Admitting: *Deleted

## 2014-01-24 NOTE — Telephone Encounter (Signed)
Pt drop off PA form foe his Belviq on Friday. MD has completed and sign fax back to insurance. Waiting on approval status...Raechel Chute/lmb

## 2014-01-25 ENCOUNTER — Encounter: Payer: Self-pay | Admitting: *Deleted

## 2014-01-25 NOTE — Telephone Encounter (Signed)
Received PA back med has been approved. Called pt no answer LMOM with approval status, also sent email...Raechel Chute/lmb

## 2014-02-28 ENCOUNTER — Encounter: Payer: Self-pay | Admitting: Internal Medicine

## 2014-03-02 ENCOUNTER — Encounter: Payer: Self-pay | Admitting: Internal Medicine

## 2014-03-02 ENCOUNTER — Ambulatory Visit (INDEPENDENT_AMBULATORY_CARE_PROVIDER_SITE_OTHER): Payer: BC Managed Care – PPO | Admitting: Internal Medicine

## 2014-03-02 ENCOUNTER — Telehealth: Payer: Self-pay | Admitting: *Deleted

## 2014-03-02 VITALS — BP 120/82 | HR 93 | Temp 98.3°F | Ht 66.25 in | Wt 175.8 lb

## 2014-03-02 DIAGNOSIS — G4733 Obstructive sleep apnea (adult) (pediatric): Secondary | ICD-10-CM

## 2014-03-02 DIAGNOSIS — J029 Acute pharyngitis, unspecified: Secondary | ICD-10-CM

## 2014-03-02 DIAGNOSIS — E663 Overweight: Secondary | ICD-10-CM

## 2014-03-02 DIAGNOSIS — I10 Essential (primary) hypertension: Secondary | ICD-10-CM

## 2014-03-02 MED ORDER — PHENTERMINE-TOPIRAMATE ER 7.5-46 MG PO CP24: 1.0000 | ORAL_CAPSULE | Freq: Every day | ORAL | Status: DC

## 2014-03-02 MED ORDER — PHENTERMINE-TOPIRAMATE ER 3.75-23 MG PO CP24: 1.0000 | ORAL_CAPSULE | Freq: Every day | ORAL | Status: DC

## 2014-03-02 MED ORDER — LISINOPRIL-HYDROCHLOROTHIAZIDE 20-25 MG PO TABS: 1.0000 | ORAL_TABLET | Freq: Every day | ORAL | Status: DC

## 2014-03-02 NOTE — Assessment & Plan Note (Signed)
BP Readings from Last 3 Encounters:  03/02/14 120/82  01/10/14 120/82  08/24/13 118/64   Added diuretic to ongoing ACE inhibitor 11/2012 Improving with weight loss, but not resolved Reduced dose summer 2014 but reports not controlled at home so will resume full tab qd now

## 2014-03-02 NOTE — Telephone Encounter (Signed)
Pt saw md this am brought PA to be fill-out on his Qsymia. Form completed and fax back waiting on approval status...Raechel Chute

## 2014-03-02 NOTE — Progress Notes (Signed)
Pre visit review using our clinic review tool, if applicable. No additional management support is needed unless otherwise documented below in the visit note. 

## 2014-03-02 NOTE — Assessment & Plan Note (Signed)
eval local by pulm for same Well controlled on CPAP and continued weight loss efforts

## 2014-03-02 NOTE — Progress Notes (Signed)
Subjective:    Patient ID: Tony [name], male    DOB: 09/27/1975, 38 y.o.   MRN: [id number]  HPI  Patient here for follow up Reviewed chronic medical issues and interval medical events  Past Medical History  Diagnosis Date  . Asthma   . Anxiety     overlap with depression and ADD  . Hypertension   . Hepatitis A   . Migraines   . OSA on CPAP     Sleep study 12/2011: severe, CPAP  . Allergic rhinitis   . Migraine     Review of Systems  Constitutional: Positive for fatigue. Negative for fever and unexpected weight change.  HENT: Positive for sore throat. Negative for ear pain, facial swelling, postnasal drip, sinus pressure and trouble swallowing. Sneezing: x 3 days.   Respiratory: Negative for cough and shortness of breath.   Cardiovascular: Negative for chest pain and leg swelling.  Musculoskeletal: Positive for myalgias (mild).  Skin: Negative for rash.       Objective:   Physical Exam  BP 120/82  Pulse 93  Temp(Src) 98.3 F (36.8 C) (Oral)  Ht 5' 6.25" (1.683 m)  Wt 175 lb 12.8 oz (79.742 kg)  BMI 28.15 kg/m2  SpO2 98% Wt Readings from Last 3 Encounters:  03/02/14 175 lb 12.8 oz (79.742 kg)  01/10/14 177 lb 12.8 oz (80.65 kg)  08/24/13 175 lb 12.8 oz (79.742 kg)   Constitutional: he is overweight, but appears well-developed and well-nourished. No distress.  HENT: OP with small viral ulceration on left side - mild-mod erythema, no exudate. No other mucosal lesions Neck: Normal range of motion. Neck supple. No JVD present. No thyromegaly present.  Cardiovascular: Normal rate, regular rhythm and normal heart sounds.  No murmur heard. No BLE edema. Pulmonary/Chest: Effort normal and breath sounds normal. No respiratory distress. he has no wheezes.  Psychiatric: he has a normal mood and affect. His behavior is normal. Judgment and thought content normal.   Lab Results  Component Value Date   WBC 4.9 12/16/2012   HGB 15.5 12/16/2012   HCT 45.5 12/16/2012   PLT 329.0 12/16/2012   GLUCOSE 101* 12/16/2012   CHOL 218* 12/16/2012   TRIG 108.0 12/16/2012   HDL 53.10 12/16/2012   LDLDIRECT 144.5 12/16/2012   LDLCALC 110 05/21/2011   ALT 68* 12/16/2012   AST 32 12/16/2012   NA 132* 12/16/2012   K 4.5 12/16/2012   CL 97 12/16/2012   CREATININE 1.3 12/16/2012   BUN 16 12/16/2012   CO2 26 12/16/2012   TSH 2.21 12/16/2012    Dg Chest 2 View  05/27/2013   CLINICAL DATA:  Chest pain.  EXAM: CHEST  2 VIEW  COMPARISON:  None.  FINDINGS: The heart size and mediastinal contours are within normal limits. Both lungs are clear. The visualized skeletal structures are unremarkable.  IMPRESSION: No active cardiopulmonary disease.   Electronically Signed   By: Charlett Nose M.D.   On: 05/27/2013 10:13       Assessment & Plan:   Viral pharyngitis - symptomatic care advised - education offered  Problem List Items Addressed This Visit   Obstructive sleep apnea     eval local by pulm for same Well controlled on CPAP and continued weight loss efforts    Overweight - Primary      Wt Readings from Last 3 Encounters:  03/02/14 175 lb 12.8 oz (79.742 kg)  01/10/14 177 lb 12.8 oz (80.65 kg)  08/24/13 175 lb 12.8  oz (79.742 kg)   Intentional 30 pound weight loss since May 2014 reviewed -  now BMI<30 but has plateau'ed Given co-dx HTN and OSA, ok for weight loss assistance med Co-tx with adderal prohibits stimulant or contrave 12/2013 trial belviq -ineffective, no change Requests trial qysmia - will rx today  we reviewed potential risk/benefit and possible side effects - pt understands and agrees to same  Encouraged ongoing exercise and diet efforts    Unspecified essential hypertension      BP Readings from Last 3 Encounters:  03/02/14 120/82  01/10/14 120/82  08/24/13 118/64   Added diuretic to ongoing ACE inhibitor 11/2012 Improving with weight loss, but not resolved Reduced dose summer 2014 but reports not controlled at home so will resume full tab qd now     Relevant Medications      lisinopril-hydrochlorothiazide (PRINZIDE,ZESTORETIC) 20-25 MG per tablet    Other Visit Diagnoses   Viral pharyngitis

## 2014-03-02 NOTE — Assessment & Plan Note (Signed)
Wt Readings from Last 3 Encounters:  03/02/14 175 lb 12.8 oz (79.742 kg)  01/10/14 177 lb 12.8 oz (80.65 kg)  08/24/13 175 lb 12.8 oz (79.742 kg)   Intentional 30 pound weight loss since May 2014 reviewed -  now BMI<30 but has plateau'ed Given co-dx HTN and OSA, ok for weight loss assistance med Co-tx with adderal prohibits stimulant or contrave 12/2013 trial belviq -ineffective, no change Requests trial qysmia - will rx today  we reviewed potential risk/benefit and possible side effects - pt understands and agrees to same  Encouraged ongoing exercise and diet efforts

## 2014-03-02 NOTE — Patient Instructions (Signed)
It was good to see you today.  We have reviewed your prior records including labs and tests today  Medications reviewed and updated Will try Qysmia for weight loss assistance - no other changes recommended at this time. Your prescription(s) have been given to you to submit to your pharmacy. Please take as directed and contact our office if you believe you are having problem(s) with the medication(s).  Please schedule followup in 6 weeks after beginning medication for weight check, call sooner if problems.

## 2014-03-03 ENCOUNTER — Encounter: Payer: Self-pay | Admitting: *Deleted

## 2014-03-03 NOTE — Telephone Encounter (Signed)
Perfect thanks

## 2014-03-03 NOTE — Telephone Encounter (Signed)
Received PA back med has been approved. Sent msg to pt on mychart med was approved...Tony [name]/lmb

## 2014-03-10 ENCOUNTER — Telehealth: Payer: Self-pay | Admitting: Internal Medicine

## 2014-03-10 ENCOUNTER — Ambulatory Visit (INDEPENDENT_AMBULATORY_CARE_PROVIDER_SITE_OTHER): Payer: BC Managed Care – PPO | Admitting: Physician Assistant

## 2014-03-10 ENCOUNTER — Encounter: Payer: Self-pay | Admitting: Physician Assistant

## 2014-03-10 ENCOUNTER — Ambulatory Visit (INDEPENDENT_AMBULATORY_CARE_PROVIDER_SITE_OTHER)
Admission: RE | Admit: 2014-03-10 | Discharge: 2014-03-10 | Disposition: A | Discharge status: Home / Self Care | Payer: BC Managed Care – PPO | Source: Ambulatory Visit | Attending: Physician Assistant | Admitting: Physician Assistant

## 2014-03-10 VITALS — BP 120/70 | HR 78 | Temp 98.3°F | Resp 18 | Wt 178.0 lb

## 2014-03-10 DIAGNOSIS — J45901 Unspecified asthma with (acute) exacerbation: Secondary | ICD-10-CM

## 2014-03-10 DIAGNOSIS — R05 Cough: Secondary | ICD-10-CM

## 2014-03-10 DIAGNOSIS — R059 Cough, unspecified: Secondary | ICD-10-CM

## 2014-03-10 MED ORDER — HYDROCODONE-HOMATROPINE 5-1.5 MG/5ML PO SYRP: 5.0000 mL | ORAL_SOLUTION | Freq: Three times a day (TID) | ORAL | Status: DC | PRN

## 2014-03-10 MED ORDER — PREDNISONE 20 MG PO TABS: 20.0000 mg | ORAL_TABLET | Freq: Two times a day (BID) | ORAL | Status: DC

## 2014-03-10 NOTE — Telephone Encounter (Signed)
Letter printed and faxed

## 2014-03-10 NOTE — Progress Notes (Signed)
Pre visit review using our clinic review tool, if applicable. No additional management support is needed unless otherwise documented below in the visit note. 

## 2014-03-10 NOTE — Patient Instructions (Signed)
[facility]. Will call with results when available.  Hycodan cough syrup as needed for cough. DO NOT TAKE WHILE DRIVING AS THIS WILL INHIBIT YOUR ABILITY TO OPERATE A MOTOR VEHICLE.  Prednisone 2 pills daily for 4 days. This medication long term could interact with your Qsymia, but this is a very short course with a relatively low dose. If you have any adverse effects, call immediately.  If emergency symptoms discussed during visit developed, seek medical attention immediately.  Followup as needed with PCP, or for worsening or persistent symptoms despite treatment.    Cough, Adult  A cough is a reflex. It helps you clear your throat and airways. A cough can help heal your body. A cough can last 2 or 3 weeks (acute) or may last more than 8 weeks (chronic). Some common causes of a cough can include an infection, allergy, or a cold. HOME CARE  Only take medicine as told by your doctor.  If given, take your medicines (antibiotics) as told. Finish them even if you start to feel better.  Use a cold steam vaporizer or humidier in your home. This can help loosen thick spit (secretions).  Sleep so you are almost sitting up (semi-upright). Use pillows to do this. This helps reduce coughing.  Rest as needed.  Stop smoking if you smoke. GET HELP RIGHT AWAY IF:  You have yellowish-white fluid (pus) in your thick spit.  Your cough gets worse.  Your medicine does not reduce coughing, and you are losing sleep.  You cough up blood.  You have trouble breathing.  Your pain gets worse and medicine does not help.  You have a fever. MAKE SURE YOU:   Understand these instructions.  Will watch your condition.  Will get help right away if you are not doing well or get worse. Document Released: 05/22/2011 Document Revised: 12/01/2011 Document Reviewed: 05/22/2011 [facility] Patient Information 2015 [place], [place]. This information is not intended to replace advice given to  you by your health care provider. Make sure you discuss any questions you have with your health care provider.

## 2014-03-10 NOTE — Telephone Encounter (Signed)
Pt needs a work note for today's office stating he was seen in our office today. [facility] [employer] [phone]   Attn: Kathi LudwigStacy Hanline

## 2014-03-10 NOTE — Progress Notes (Signed)
Subjective:    Patient ID: Tony LudwigStacy [name], male    DOB: September 11, 1976, 38 y.o.   MRN: [id number]  Cough This is a new problem. The current episode started 1 to 4 weeks ago (2 weeks). The problem has been unchanged. The problem occurs every few minutes. The cough is productive of sputum (no discoloration.). Associated symptoms include ear pain, headaches, nasal congestion, postnasal drip, a sore throat and wheezing. Pertinent negatives include no chest pain, chills, ear congestion, fever, heartburn, hemoptysis, myalgias, rash, rhinorrhea, shortness of breath, sweats or weight loss. The symptoms are aggravated by pollens and dust. Risk factors for lung disease include travel (states has this [place], and has traveled there recently). Treatments tried: takes azelastine, flonase, and zyrtec daily. The treatment provided mild relief. His past medical history is significant for asthma and environmental allergies. There is no history of COPD.      Review of Systems  Constitutional: Negative for fever, chills and weight loss.  HENT: Positive for ear pain, postnasal drip and sore throat. Negative for rhinorrhea.   Respiratory: Positive for cough, chest tightness and wheezing. Negative for hemoptysis and shortness of breath.   Cardiovascular: Negative for chest pain.  Gastrointestinal: Negative for heartburn, nausea, vomiting and diarrhea.  Musculoskeletal: Negative for myalgias.  Skin: Negative for rash.  Allergic/Immunologic: Positive for environmental allergies.  Neurological: Positive for headaches.  All other systems reviewed and are negative.     Past Medical History  Diagnosis Date  . Asthma   . Anxiety     overlap with depression and ADD  . Hypertension   . Hepatitis A   . Migraines   . OSA on CPAP     Sleep study 12/2011: severe, CPAP 10mmHg  . Allergic rhinitis   . Migraine     History   Social History  . Marital Status: Single    Spouse Name: N/A      Number of Children: N/A  . Years of Education: N/A   Occupational History  . 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 Smoking status: Former Smoker -- 0.24 packs/day for 3 years    Types: Cigarettes    Quit date: 09/22/2008  . 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 Alcohol Use: Yes     Comment: OCCASSIONAL  . Drug Use: No  . Sexual Activity: Not on file   Other Topics Concern  . Not on file   Social History Narrative  . No narrative on file    Past Surgical History  Procedure Laterality Date  . Lasik  2005  . Wisdom tooth extraction      Family History  Problem Relation Age of Onset  . Hypertension Mother   . Arthritis Father   . Hypertension Father   . Diabetes Other   . Emphysema Paternal Grandfather   . Asthma Paternal Grandfather     Allergies  Allergen Reactions  . Erythromycin     Pt states med make him sick    Current Outpatient Prescriptions on File Prior to Visit  Medication Sig Dispense Refill  . acyclovir ointment (ZOVIRAX) 5 % APPLY TOPICALLY EVERY 3 HOURS  15 g  0  . albuterol (PROAIR HFA) 108 (90 BASE) MCG/ACT inhaler Inhale 2 puffs into the lungs every 6 (six) hours as needed for wheezing.  1 Inhaler  3  . ALPRAZolam (XANAX) 0.5 MG tablet TAKE ONE TABLET BY MOUTH AT BEDTIME AS NEEDED FOR SLEEP AND OR ANXIETY  60 tablet  0  . amphetamine-dextroamphetamine (ADDERALL XR) 15 MG 24 hr capsule Take 1 capsule (15 mg total) by mouth every morning.  30 capsule  0  . azelastine (ASTELIN) 137 MCG/SPRAY nasal spray Place 2 sprays into the nose 2 (two) times daily. Use in each nostril as directed  30 mL  2  . cetirizine (ZYRTEC) 10 MG tablet Take 1 tablet (10 mg total) by mouth daily.  30 tablet  11  . DULoxetine (CYMBALTA) 30 MG capsule TAKE ONE CAPSULE BY MOUTH ONCE DAILY  30 capsule  5  . fluticasone (FLONASE) 50 MCG/ACT nasal spray USE  TWO SPRAY IN NOSE ONCE DAILY  16 g  5  . indomethacin (INDOCIN) 25 MG capsule TAKE ONE CAPSULE BY MOUTH THREE TIMES DAILY WITH MEALS  60 capsule  5  . lisinopril-hydrochlorothiazide (PRINZIDE,ZESTORETIC) 20-25 MG per tablet Take 1 tablet by mouth daily.  30 tablet  5  . Phentermine-Topiramate 7.5-46 MG CP24 Take 1 capsule by mouth daily.  30 capsule  0  . SUMAtriptan (IMITREX) 50 MG tablet TAKE ONE TABLET BY MOUTH ONCE DAILY AS NEEDED FOR  MIGRAINE  12 tablet  0   No current facility-administered medications on file prior to visit.    EXAM: BP 120/70  Pulse 78  Temp(Src) 98.3 F (36.8 C) (Oral)  Resp 18  Wt 178 lb (80.74 kg)  SpO2 99%     Objective:   Physical Exam  Nursing note and vitals reviewed. Constitutional: He is oriented to person, place, and time. He appears well-developed and well-nourished. No distress.  HENT:  Head: Normocephalic and atraumatic.  Right Ear: External ear normal.  Left Ear: External ear normal.  Nose: Nose normal.  Mouth/Throat: No oropharyngeal exudate.  Oropharynx is slightly erythematous, no exudate. Bilateral TMs normal. Bilateral frontal and maxillary sinuses non-TTP.   Eyes: Conjunctivae and EOM are normal. Pupils are equal, round, and reactive to light.  Neck: Normal range of motion. Neck supple. No JVD present.  Cardiovascular: Normal rate, regular rhythm, normal heart sounds and intact distal pulses.   Pulmonary/Chest: Effort normal. No stridor. No respiratory distress. He has wheezes. He has no rales. He exhibits no tenderness.  Rhonchi bilat lung bases.  Musculoskeletal: Normal range of motion.  Lymphadenopathy:    He has no cervical adenopathy.  Neurological: He is alert and oriented to person, place, and time.  Skin: Skin is warm and dry. No rash noted. He is not diaphoretic. No erythema. No pallor.  Psychiatric: He has a normal mood and affect. His behavior is normal. Judgment and thought content normal.    Lab Results   Component Value Date   WBC 4.9 12/16/2012   HGB 15.5 12/16/2012   HCT 45.5 12/16/2012   PLT 329.0 12/16/2012   GLUCOSE 101* 12/16/2012   CHOL 218* 12/16/2012   TRIG 108.0 12/16/2012   HDL 53.10 12/16/2012   LDLDIRECT 144.5 12/16/2012   LDLCALC 110 05/21/2011   ALT 68* 12/16/2012   AST 32 12/16/2012   NA 132* 12/16/2012   K 4.5 12/16/2012   CL 97 12/16/2012   CREATININE 1.3 12/16/2012   BUN 16 12/16/2012   CO2 26 12/16/2012   TSH 2.21 12/16/2012         Assessment & Plan:  Tony ArnoldStacy was seen today for cough.  Diagnoses and associated orders for this visit:  Cough Comments: reactive airway componenet. Will try hycodan and short course prednison. CXR today. - DG Chest 2 View; Future - HYDROcodone-homatropine (HYCODAN) 5-1.5 MG/5ML syrup; Take 5 mLs by mouth every 8 (eight) hours as needed for cough. - predniSONE (DELTASONE) 20 MG tablet; Take 1 tablet (20 mg total) by mouth 2 (two) times daily with a meal.  Unspecified asthma, with exacerbation - DG Chest 2 View; Future - HYDROcodone-homatropine (HYCODAN) 5-1.5 MG/5ML syrup; Take 5 mLs by mouth every 8 (eight) hours as needed for cough. - predniSONE (DELTASONE) 20 MG tablet; Take 1 tablet (20 mg total) by mouth 2 (two) times daily with a meal.    Pt on Qsymia concerned about possible interaction between prednisone and Qsymia. Pt informed that interaction is unlikely given very short treatment course of prednisone, relatively low dose, and relatively low dose of Qsymia components compared with levels that have demonstrated reaction.  No driving while on Hycodan.  Not currently showing signs of active infection. Plan may change depending on CXR result.  Return precautions provided, and patient handout on cough.  Plan to follow up as needed with PCP, or for worsening or persistent symptoms despite treatment.  Patient Instructions  C[facility] today at [facility] office. Will call with results when available.  Hycodan cough syrup as needed for  cough. DO NOT TAKE WHILE DRIVING AS THIS WILL INHIBIT YOUR ABILITY TO OPERATE A MOTOR VEHICLE.  Prednisone 2 pills daily for 4 days. This medication long term could interact with your Qsymia, but this is a very short course with a relatively low dose. If you have any adverse effects, call immediately.  If emergency symptoms discussed during visit developed, seek medical attention immediately.  Followup as needed with PCP, or for worsening or persistent symptoms despite treatment.

## 2014-03-14 ENCOUNTER — Encounter: Payer: Self-pay | Admitting: Internal Medicine

## 2014-03-17 NOTE — Telephone Encounter (Signed)
Tony [name] - please check if any of my partners will refill hycodan on my behalf - if not, please let pt know i have been out of town and can refill on Monday if still needed - thanks

## 2014-03-27 ENCOUNTER — Encounter: Payer: Self-pay | Admitting: Internal Medicine

## 2014-03-27 ENCOUNTER — Ambulatory Visit (INDEPENDENT_AMBULATORY_CARE_PROVIDER_SITE_OTHER): Payer: BC Managed Care – PPO | Admitting: Internal Medicine

## 2014-03-27 VITALS — BP 120/84 | HR 84 | Temp 98.2°F | Ht 66.0 in | Wt 182.8 lb

## 2014-03-27 DIAGNOSIS — L739 Follicular disorder, unspecified: Secondary | ICD-10-CM

## 2014-03-27 DIAGNOSIS — L738 Other specified follicular disorders: Secondary | ICD-10-CM

## 2014-03-27 MED ORDER — DOXYCYCLINE HYCLATE 100 MG PO TABS: 100.0000 mg | ORAL_TABLET | Freq: Two times a day (BID) | ORAL | Status: DC

## 2014-03-27 MED ORDER — HYDROCODONE-HOMATROPINE 5-1.5 MG/5ML PO SYRP: 5.0000 mL | ORAL_SOLUTION | Freq: Three times a day (TID) | ORAL | Status: DC | PRN

## 2014-03-27 MED ORDER — LISINOPRIL-HYDROCHLOROTHIAZIDE 20-25 MG PO TABS: 1.0000 | ORAL_TABLET | Freq: Every day | ORAL | Status: DC

## 2014-03-27 NOTE — Patient Instructions (Addendum)
It was good to see you today.  We have reviewed your prior records including labs and tests today  Medications reviewed and updated Doxycycline antibiotics 2x/day x 10 days - Your prescription(s) have been submitted to your pharmacy. Please take as directed and contact our office if you believe you are having problem(s) with the medication(s). Refill on medication(s) as discussed today.  Do not shave until skin clears - see below   Folliculitis  Folliculitis is redness, soreness, and swelling (inflammation) of the hair follicles. This condition can occur anywhere on the body. People with weakened immune systems, diabetes, or obesity have a greater risk of getting folliculitis. CAUSES  Bacterial infection. This is the most common cause.  Fungal infection.  Viral infection.  Contact with certain chemicals, especially oils and tars. Long-term folliculitis can result from bacteria that live in the nostrils. The bacteria may trigger multiple outbreaks of folliculitis over time. SYMPTOMS Folliculitis most commonly occurs on the scalp, thighs, legs, back, buttocks, and areas where hair is shaved frequently. An early sign of folliculitis is a small, white or yellow, pus-filled, itchy lesion (pustule). These lesions appear on a red, inflamed follicle. They are usually less than 0.2 inches (5 mm) wide. When there is an infection of the follicle that goes deeper, it becomes a boil or furuncle. A group of closely packed boils creates a larger lesion (carbuncle). Carbuncles tend to occur in hairy, sweaty areas of the body. DIAGNOSIS  Your caregiver can usually tell what is wrong by doing a physical exam. A sample may be taken from one of the lesions and tested in a lab. This can help determine what is causing your folliculitis. TREATMENT  Treatment may include:  Applying warm compresses to the affected areas.  Taking antibiotic medicines orally or applying them to the skin.  Draining the lesions  if they contain a large amount of pus or fluid.  Laser hair removal for cases of long-lasting folliculitis. This helps to prevent regrowth of the hair. HOME CARE INSTRUCTIONS  Apply warm compresses to the affected areas as directed by your caregiver.  If antibiotics are prescribed, take them as directed. Finish them even if you start to feel better.  You may take over-the-counter medicines to relieve itching.  Do not shave irritated skin.  Follow up with your caregiver as directed. SEEK IMMEDIATE MEDICAL CARE IF:   You have increasing redness, swelling, or pain in the affected area.  You have a fever. MAKE SURE YOU:  Understand these instructions.  Will watch your condition.  Will get help right away if you are not doing well or get worse. Document Released: 11/17/2001 Document Revised: 03/09/2012 Document Reviewed: 12/09/2011 [facility] Patient Information 2015 [place], [place]. This information is not intended to replace advice given to you by your health care provider. Make sure you discuss any questions you have with your health care provider.

## 2014-03-27 NOTE — Progress Notes (Signed)
   Subjective:    Patient ID: Tony [name], male    DOB: November 23, 1975, 38 y.o.   MRN: [id number]  Rash Pertinent negatives include no cough, fatigue, fever or shortness of breath.   Also reviewed chronic medical issues and interval medical events  Past Medical History  Diagnosis Date  . Asthma   . Anxiety     overlap with depression and ADD  . Hypertension   . Hepatitis A   . Migraines   . OSA on CPAP     Sleep study 12/2011: severe, CPAP 10mmHg  . Allergic rhinitis   . Migraine     Review of Systems  Constitutional: Negative for fever and fatigue.  Respiratory: Negative for cough and shortness of breath.   Cardiovascular: Negative for chest pain and leg swelling.  Skin: Positive for rash (face hair growth area x [place] [facility] Negative for wound.       Objective:   Physical Exam  BP 120/84  Pulse 84  Temp(Src) 98.2 F (36.8 C) (Oral)  Ht 5\' 6"  (1.676 m)  Wt 182 lb 12 oz (82.895 kg)  BMI 29.51 kg/m2  SpO2 97% Wt Readings from Last 3 Encounters:  03/27/14 182 lb 12 oz (82.895 kg)  03/10/14 178 lb (80.74 kg)  03/02/14 175 lb 12.8 oz (79.742 kg)   Constitutional: he is overweight, but appears well-developed and well-nourished. No distress.  Neck: Normal range of motion. Neck supple. No JVD present. No thyromegaly present.  Cardiovascular: Normal rate, regular rhythm and normal heart sounds.  No murmur heard. No BLE edema. Pulmonary/Chest: Effort normal and breath sounds normal. No respiratory distress. he has no wheezes.  Skin: folliculitis with "acne" changes anterior neck and face Psychiatric: he has a normal mood and affect. His behavior is normal. Judgment and thought content normal.   Lab Results  Component Value Date   WBC 4.9 12/16/2012   HGB 15.5 12/16/2012   HCT 45.5 12/16/2012   PLT 329.0 12/16/2012   GLUCOSE 101* 12/16/2012   CHOL 218* 12/16/2012   TRIG 108.0 12/16/2012   HDL 53.10 12/16/2012   LDLDIRECT 144.5 12/16/2012   LDLCALC 110 05/21/2011   ALT  68* 12/16/2012   AST 32 12/16/2012   NA 132* 12/16/2012   K 4.5 12/16/2012   CL 97 12/16/2012   CREATININE 1.3 12/16/2012   BUN 16 12/16/2012   CO2 26 12/16/2012   TSH 2.21 12/16/2012    Dg Chest 2 View  03/10/2014   CLINICAL DATA:  Cough.  Short of breath.  EXAM: CHEST  2 VIEW  COMPARISON:  05/27/2013  FINDINGS: The heart size and mediastinal contours are within normal limits. Both lungs are clear. The visualized skeletal structures are unremarkable.  IMPRESSION: No active cardiopulmonary disease.   Electronically Signed   By: Amie Portlandavid  Ormond M.D.   On: 03/10/2014 11:36       Assessment & Plan:   Folliculitis - education on dx provided - rx abx and advised to avoid shaving until skin clear -

## 2014-03-27 NOTE — Progress Notes (Signed)
Pre visit review using our clinic review tool, if applicable. No additional management support is needed unless otherwise documented below in the visit note. 

## 2014-04-17 ENCOUNTER — Other Ambulatory Visit: Payer: Self-pay | Admitting: Internal Medicine

## 2014-04-18 NOTE — Telephone Encounter (Signed)
Faxed script back to [facility].../lmb 

## 2014-05-19 ENCOUNTER — Ambulatory Visit (INDEPENDENT_AMBULATORY_CARE_PROVIDER_SITE_OTHER): Payer: BC Managed Care – PPO | Admitting: Internal Medicine

## 2014-05-19 VITALS — BP 112/78 | HR 85 | Temp 97.9°F | Wt 174.3 lb

## 2014-05-19 DIAGNOSIS — J3089 Other allergic rhinitis: Principal | ICD-10-CM

## 2014-05-19 DIAGNOSIS — J4521 Mild intermittent asthma with (acute) exacerbation: Secondary | ICD-10-CM

## 2014-05-19 DIAGNOSIS — Z23 Encounter for immunization: Secondary | ICD-10-CM

## 2014-05-19 DIAGNOSIS — J302 Other seasonal allergic rhinitis: Secondary | ICD-10-CM

## 2014-05-19 DIAGNOSIS — J45901 Unspecified asthma with (acute) exacerbation: Secondary | ICD-10-CM

## 2014-05-19 DIAGNOSIS — I10 Essential (primary) hypertension: Secondary | ICD-10-CM

## 2014-05-19 DIAGNOSIS — J309 Allergic rhinitis, unspecified: Secondary | ICD-10-CM

## 2014-05-19 MED ORDER — HYDROCODONE-HOMATROPINE 5-1.5 MG/5ML PO SYRP: 5.0000 mL | ORAL_SOLUTION | Freq: Three times a day (TID) | ORAL | Status: DC | PRN

## 2014-05-19 MED ORDER — PREDNISONE 10 MG PO TABS: ORAL_TABLET | ORAL | Status: DC

## 2014-05-19 MED ORDER — LEVALBUTEROL TARTRATE 45 MCG/ACT IN AERO: 2.0000 | INHALATION_SPRAY | Freq: Four times a day (QID) | RESPIRATORY_TRACT | Status: DC | PRN

## 2014-05-19 MED ORDER — METHYLPREDNISOLONE ACETATE 80 MG/ML IJ SUSP
80.0000 mg | Freq: Once | INTRAMUSCULAR | Status: AC
  Administered 2014-05-19: 80 mg via INTRAMUSCULAR

## 2014-05-19 NOTE — Progress Notes (Signed)
Subjective:    Patient ID: Tony [name], male    DOB: Mar 11, 1976, 38 y.o.   MRN: [id number]  HPI  Here with concern for ? URI: Does have several wks ongoing nasal allergy symptoms with clearish congestion, itch and sneezing, without fever, pain, ST, cough, swelling or wheezing, except for worsenig wheezing/tightness last few days.. Pt denies chest pain, increased sob or doe, wheezing, orthopnea, PND, increased LE swelling, palpitations, dizziness or syncope except for the above.  Pt denies new neurological symptoms such as new headache, or facial or extremity weakness or numbness   Pt denies polydipsia, polyuria, Albuterol MDI makes him shaky, does not use Past Medical History  Diagnosis Date  . Asthma   . Anxiety     overlap with depression and ADD  . Hypertension   . Hepatitis A   . Migraines   . OSA on CPAP     Sleep study 12/2011: severe, CPAP  . Allergic rhinitis   . Migraine    Past Surgical History  Procedure Laterality Date  . Lasik  2005  . Wisdom tooth extraction      reports that he quit smoking about 5 years ago. His smoking use included Cigarettes. He has a .72 pack-year smoking history. He does not have any smokeless tobacco history on file. He reports that he drinks alcohol. He reports that he does not use illicit drugs. family history includes Arthritis in his father; Asthma in his paternal grandfather; Diabetes in his other; Emphysema in his paternal grandfather; Hypertension in his father and mother. Allergies  Allergen Reactions  . Erythromycin     Pt states med make him sick   Current Outpatient Prescriptions on File Prior to Visit  Medication Sig Dispense Refill  . acyclovir ointment (ZOVIRAX) 5 % APPLY TOPICALLY EVERY 3 HOURS  15 g  0  . ALPRAZolam (XANAX) 0.5 MG tablet TAKE ONE TABLET BY MOUTH AT BEDTIME AS NEEDED FOR SLEEP AND OR ANXIETY  60 tablet  3  . amphetamine-dextroamphetamine (ADDERALL XR) 15 MG 24 hr capsule Take 1 capsule (15 mg total) by  mouth every morning.  30 capsule  0  . azelastine (ASTELIN) 137 MCG/SPRAY nasal spray Place 2 sprays into the nose 2 (two) times daily. Use in each nostril as directed  30 mL  2  . cetirizine (ZYRTEC) 10 MG tablet Take 1 tablet (10 mg total) by mouth daily.  30 tablet  11  . doxycycline (VIBRA-TABS) 100 MG tablet Take 1 tablet (100 mg total) by mouth 2 (two) times daily.  20 tablet  0  . DULoxetine (CYMBALTA) 30 MG capsule TAKE ONE CAPSULE BY MOUTH ONCE DAILY  30 capsule  5  . fluticasone (FLONASE) 50 MCG/ACT nasal spray USE TWO SPRAY IN NOSE ONCE DAILY  16 g  5  . indomethacin (INDOCIN) 25 MG capsule TAKE ONE CAPSULE BY MOUTH THREE TIMES DAILY WITH MEALS  60 capsule  5  . lisinopril-hydrochlorothiazide (PRINZIDE,ZESTORETIC) 20-25 MG per tablet Take 1 tablet by mouth daily.  90 tablet  3  . Phentermine-Topiramate 7.5-46 MG CP24 Take 1 capsule by mouth daily.  30 capsule  0  . SUMAtriptan (IMITREX) 50 MG tablet TAKE ONE TABLET BY MOUTH ONCE DAILY AS NEEDED FOR  MIGRAINE  12 tablet  0   No current facility-administered medications on file prior to visit.    Review of Systems  Constitutional: Negative for unusual diaphoresis or other sweats  HENT: Negative for ringing in ear Eyes: Negative  for double vision or worsening visual disturbance.  Respiratory: Negative for choking and stridor.   Gastrointestinal: Negative for vomiting or other signifcant bowel change Genitourinary: Negative for hematuria or decreased urine volume.  Musculoskeletal: Negative for other MSK pain or swelling Skin: Negative for color change and worsening wound.  Neurological: Negative for tremors and numbness other than noted  Psychiatric/Behavioral: Negative for decreased concentration or agitation other than above       Objective:   Physical Exam BP 112/78  Pulse 85  Temp(Src) 97.9 F (36.6 C) (Oral)  Wt 174 lb 5 oz (79.068 kg)  SpO2 98% VS noted,  Constitutional: Pt appears well-developed, well-nourished.    HENT: Head: NCAT.  Right Ear: External ear normal.  Left Ear: External ear normal.  Eyes: . Pupils are equal, round, and reactive to light. Conjunctivae and EOM are normal Neck: Normal range of motion. Neck supple.  Bilat tm's with mild erythema.  Max sinus areas mild tender.  Pharynx with mild erythema, no exudate Cardiovascular: Normal rate and regular rhythm.   Pulmonary/Chest: Effort normal and breath sounds mild decreased, with few bilat wheeze Neurological: Pt is alert. Not confused , motor grossly intact Skin: Skin is warm. No rash Psychiatric: Pt behavior is normal. No agitation.     Assessment & Plan:

## 2014-05-19 NOTE — Patient Instructions (Signed)
You had the flu shot today  You had the steroid shot today  Please take all new medication as prescribed - the prednisone, and the new inhaler - Xopenex HFA  OK to hold off on taking the Proair HFA inhaler  Please continue all other medications as before  Please have the pharmacy call with any other refills you may need.  Please keep your appointments with your specialists as you may have planned

## 2014-05-19 NOTE — Progress Notes (Signed)
Pre visit review using our clinic review tool, if applicable. No additional management support is needed unless otherwise documented below in the visit note. 

## 2014-05-21 ENCOUNTER — Other Ambulatory Visit: Payer: Self-pay | Admitting: Internal Medicine

## 2014-05-22 ENCOUNTER — Encounter: Payer: Self-pay | Admitting: Internal Medicine

## 2014-05-22 NOTE — Assessment & Plan Note (Signed)
Mild to mod, for depomedrol IM, predpac asd, to f/u any worsening symptoms or concerns 

## 2014-05-22 NOTE — Assessment & Plan Note (Signed)
Also for change albuterol to xopenex mdi prn,  to f/u any worsening symptoms or concerns

## 2014-05-22 NOTE — Assessment & Plan Note (Signed)
stable overall by history and exam, recent data reviewed with pt, and pt to continue medical treatment as before,  to f/u any worsening symptoms or concerns BP Readings from Last 3 Encounters:  05/19/14 112/78  03/27/14 120/84  03/10/14 120/70

## 2014-05-23 ENCOUNTER — Encounter: Payer: Self-pay | Admitting: Internal Medicine

## 2014-05-23 ENCOUNTER — Other Ambulatory Visit: Payer: Self-pay

## 2014-05-23 MED ORDER — PHENTERMINE-TOPIRAMATE ER 7.5-46 MG PO CP24: 1.0000 | ORAL_CAPSULE | Freq: Every day | ORAL | Status: DC

## 2014-06-06 ENCOUNTER — Telehealth: Payer: Self-pay | Admitting: Internal Medicine

## 2014-06-06 NOTE — Telephone Encounter (Signed)
Patient had to move appt to December bc of schedule change.  He is concern about phentermine topiramate and is requesting a call back in regards.

## 2014-06-07 ENCOUNTER — Telehealth: Payer: Self-pay | Admitting: Internal Medicine

## 2014-06-07 NOTE — Telephone Encounter (Signed)
MyChart Message copied below:    Appointment Request From: [name]  With Provider: Waymon Budge, MD [facility]]  Preferred Date Range: Any date 06/06/2014 or later  Preferred Times: Any  Reason for visit: Office Visit  Comments: I am having breathing/allergy problems and would like to see Dr. Maple Hudson again.

## 2014-06-07 NOTE — Telephone Encounter (Signed)
Patient called back.  Please return call.

## 2014-06-07 NOTE — Telephone Encounter (Signed)
Spoke with the pt  He states that he has been having some issues with cough and has been seeing PCP for this  Wants appt with [facility]- first avail ok per pt  OV set for 06/29/14  Nothing further needed

## 2014-06-07 NOTE — Telephone Encounter (Signed)
Spoke with patient.   Pt would like to know if she can try Saxenda for weight loss.

## 2014-06-07 NOTE — Telephone Encounter (Signed)
LVM for pt to call back regarding refill question.

## 2014-06-08 NOTE — Telephone Encounter (Signed)
Let him know we will discuss this at our next OV. No changes in med at this time thanks

## 2014-06-12 ENCOUNTER — Telehealth: Payer: Self-pay

## 2014-06-12 NOTE — Telephone Encounter (Signed)
Pt has been informed of MD response. Rx change will be discussed at next OV.

## 2014-06-22 ENCOUNTER — Ambulatory Visit: Payer: Self-pay | Admitting: Internal Medicine

## 2014-06-29 ENCOUNTER — Ambulatory Visit (INDEPENDENT_AMBULATORY_CARE_PROVIDER_SITE_OTHER): Payer: BC Managed Care – PPO | Admitting: Internal Medicine

## 2014-06-29 ENCOUNTER — Encounter: Payer: Self-pay | Admitting: Internal Medicine

## 2014-06-29 VITALS — BP 112/84 | HR 90 | Ht 66.5 in | Wt 177.4 lb

## 2014-06-29 DIAGNOSIS — J309 Allergic rhinitis, unspecified: Secondary | ICD-10-CM

## 2014-06-29 DIAGNOSIS — J4521 Mild intermittent asthma with (acute) exacerbation: Secondary | ICD-10-CM

## 2014-06-29 DIAGNOSIS — G4733 Obstructive sleep apnea (adult) (pediatric): Secondary | ICD-10-CM

## 2014-06-29 DIAGNOSIS — J302 Other seasonal allergic rhinitis: Secondary | ICD-10-CM

## 2014-06-29 DIAGNOSIS — J3089 Other allergic rhinitis: Secondary | ICD-10-CM

## 2014-06-29 MED ORDER — HYDROCODONE-HOMATROPINE 5-1.5 MG/5ML PO SYRP: 5.0000 mL | ORAL_SOLUTION | Freq: Three times a day (TID) | ORAL | Status: DC | PRN

## 2014-06-29 MED ORDER — OLMESARTAN MEDOXOMIL-HCTZ 20-12.5 MG PO TABS: 1.0000 | ORAL_TABLET | Freq: Every day | ORAL | Status: DC

## 2014-06-29 NOTE — Patient Instructions (Signed)
Script to refill [facility] CorporationHycodan  Script for RadioShackBenicar-HCT. Try this for 1 month, instead of lisinopril HCT    Watch over that time to see if the cough clears up.   Try changing from azelastine to otc nasal spray Nasalcrom/ cromol /cromolyn

## 2014-06-29 NOTE — Progress Notes (Signed)
Subjective:    Patient ID: Tony LudwigStacy Cannady, male    DOB: May 02, 1976, 38 y.o.   MRN: [id number]  HPI 38 yo M former smoker with hx OSA/ CPAP, allergic rhinitis Referred by Dr Felicity CoyerLeschber for allergy testing. Allergy [facility], [place], [place] Describes lifelong seasonal and perennial allergic rhinitis. Followed by an ENT/allergy group in [place]. Last allergy skin testing was a long time ago-positive for mold, grass and common allergens. Allergy vaccine did help at that time but he quit after a year. Now wakes up with nasal congestion. Worse if visiting [place] rather than here and worse in spring, best in winter. Takes daily Zyrtec and Flonase. Little asthma after childhood.. No history of skin, food or insect sensitivities. NPSG 01/19/12- AHI 49.0/ hr, Weight 186 lbs. CPAP titrated to 10 cwp.  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 Parents living, father with asthma, nobody known to have OSA.   08/24/13- 38 yo M former smoker with hx OSA/ CPAP, allergic rhinitis Allergy Profile 03/18/13- Total IgE 134.9 POS for house dust/ mite, dog, cat, grass No antihistamines, OTC cough syrups, or OTC sleep aids in past 3 days Years ago had rapid heart beat with skin testing- sounds like possibly vaso-vagal. Today no infection, but notes more head congestion off antihistamine.  CPAP 10/? DME  doing well Allergy Skin test 08/24/13- Positive for dust mite, grass/weed/tree pollens Reports being uncomfortable in head and chest while off of antihistamines for skin testing, but no wheeze.  Spring season is worst. He wants to check price of allergy vaccine before considering whether to try allergy shots.  06/29/14- 38 yo M former smoker with hx OSA quit CPAP, allergic rhinitis FOLLOW FOR: dry cough, coughing so hard that it feels like he needs to vomit; chest tightness; hard to catch breath when coughing  dry cough since May. Xopenex some help. Sometimes a little wheeze. No nasal  congestion or drainage. Denies any sense of reflux. Stopped using CPAP10 when he lost weight. Denies snoring or daytime sleepiness Disliked the taste of antihistamine nasal spray and asks alternative CXR 02/28/14 IMPRESSION:  No active cardiopulmonary disease.  Electronically Signed  By: Amie Portlandavid Ormond M.D.  On: 03/10/2014 11:36 and ROS-see HPI Constitutional:   No-   weight loss, night sweats, fevers, chills, fatigue, lassitude. HEENT:   No-  headaches, difficulty swallowing, tooth/dental problems, sore throat,       No-  sneezing, itching, ear ache, +nasal congestion, post nasal drip,  CV:  No-   chest pain, orthopnea, PND, swelling in lower extremities, anasarca, dizziness, palpitations Resp: No-   shortness of breath with exertion or at rest.              No-   productive cough,  + non-productive cough,  No- coughing up of blood.              No-   change in color of mucus.  No- wheezing.   Skin: No-   rash or lesions. GI:  No-   heartburn, indigestion, abdominal pain, nausea, vomiting,  GU:  MS:  No-   joint pain or swelling.   Neuro-     nothing unusual Psych:  No- change in mood or affect. No depression or anxiety.  No memory loss.      Objective:   Physical Exam OBJ- Physical Exam General- Alert, Oriented, Affect-appropriate, Distress- none acute. Weighs 25 pounds less  than one year ago                       Has lost 25 pounds since May 2014 Skin- rash-none, lesions- none, excoriation- none Lymphadenopathy- none Head- atraumatic            Eyes- Gross vision intact, PERRLA, conjunctivae and secretions clear            Ears- Hearing, canals-normal            Nose- Clear, no-Septal dev, mucus, polyps, erosion, perforation             Throat- Mallampati II-III , mucosa clear , drainage- none, tonsils- atrophic Neck- flexible , trachea midline, no stridor , thyroid nl, carotid no bruit Chest - symmetrical excursion , unlabored           Heart/CV- RRR , no  murmur , no gallop  , no rub, nl s1 s2                           - JVD- none , edema- none, stasis changes- none, varices- none           Lung- clear to P&A, wheeze- none, cough+ mild/dry, dullness-none, rub- none           Chest wall-  Abd-  Br/ Gen/ Rectal- Not done, not indicated Extrem- cyanosis- none, clubbing, none, atrophy- none, strength- nl Neuro- grossly intact to observation     Assessment & Plan:

## 2014-06-30 NOTE — Assessment & Plan Note (Signed)
Dislikes the taste of antihistamine nasal spray. Plan- change azelastine to otc Nasalcrom

## 2014-06-30 NOTE — Assessment & Plan Note (Signed)
Stopped CPAP when he lost weight. Denies symptoms

## 2014-06-30 NOTE — Assessment & Plan Note (Signed)
Dry cough might be related to asthma we have to show it is not related to his ACE inhibitor lisinopril Plan-one-month change from lisinopril-HCTZ to Breo 20-25. If all substantially improves, then he will talk with his primary physician about long term replacement

## 2014-07-12 ENCOUNTER — Telehealth: Payer: Self-pay | Admitting: Internal Medicine

## 2014-07-12 NOTE — Telephone Encounter (Signed)
Katie have you seen a PA come through on this pt?

## 2014-07-13 ENCOUNTER — Encounter: Payer: Self-pay | Admitting: Internal Medicine

## 2014-07-13 NOTE — Telephone Encounter (Signed)
I do not have any information on this patient. Thanks.

## 2014-07-13 NOTE — Telephone Encounter (Signed)
Pt returning call for Robynn Panelise to check on PA

## 2014-07-13 NOTE — Telephone Encounter (Signed)
Called and spoke to BurlesonStacey at [facility]. She stated she will re-fax (triage fax) PA on the pt's Benicar. Will await fax.   LMTCB for pt.

## 2014-07-13 NOTE — Telephone Encounter (Signed)
Received fax. Called the provided number for PA, [phone].  The only other covered medication similar to [facility] is Azor. Pt last seen on 06/29/2014 with CY.   Patient Instructions     Script to refill [employer]codan  Script for [facility]-HCT. Try this for 1 month, instead of lisinopril HCT Watch over that time to see if the cough clears up.  Try changing from azelastine to otc nasal spray Nasalcrom/ cromol /cromolyn     CY please advise if to initiate PA for Benicar-HCT or start pt on Azor.   Allergies  Allergen Reactions  . Erythromycin     Pt states med make him sick

## 2014-07-13 NOTE — Telephone Encounter (Signed)
Spoke with pt and advised of Dr Roxy CedarYoung's recommendations.  PT will contact Dr Felicity CoyerLeschber to see what she recommends.

## 2014-07-13 NOTE — Telephone Encounter (Signed)
The intention was to change ffrom Lisinopril HCT (ACE inhibitor) to Benicar hct for one month to see if dry cough improved. If so, I can't manage for blood pressure, so he needs to contact his PCP and ask for a change from lisinopril to something approved by his PCP. If cough no better, then he doesn't need to change.

## 2014-07-14 ENCOUNTER — Telehealth: Payer: Self-pay | Admitting: Internal Medicine

## 2014-07-14 NOTE — Telephone Encounter (Signed)
Called and spoke to a tech at [facility] to inform him of the recs per CY. He verbalized understanding. Advised him the pt's PCP is Dr. Felicity CoyerLeschber. Nothing further needed.   See phone note from 07/12/14.  Waymon Budgelinton D Young, MD at 07/13/2014 1:12 PM     Status: Signed        The intention was to change ffrom Lisinopril HCT (ACE inhibitor) to Benicar hct for one month to see if dry cough improved. If so, I can't manage for blood pressure, so he needs to contact his PCP and ask for a change from lisinopril to something approved by his PCP. If cough no better, then he doesn't need to change.

## 2014-07-24 ENCOUNTER — Other Ambulatory Visit: Payer: Self-pay | Admitting: Internal Medicine

## 2014-07-26 MED ORDER — LOSARTAN POTASSIUM-HCTZ 50-12.5 MG PO TABS: 1.0000 | ORAL_TABLET | Freq: Every day | ORAL | Status: DC

## 2014-07-27 ENCOUNTER — Other Ambulatory Visit: Payer: Self-pay | Admitting: Internal Medicine

## 2014-07-27 MED ORDER — HYDROCODONE-HOMATROPINE 5-1.5 MG/5ML PO SYRP: 5.0000 mL | ORAL_SOLUTION | Freq: Three times a day (TID) | ORAL | Status: DC | PRN

## 2014-07-27 NOTE — Telephone Encounter (Signed)
Rx printed and placed on Tony [name]'s cart to be signed and placed up front for pick up. Called and spoke to pt to inform him of the rx left up front. Pt verbalized understanding and denied any further questions or concerns at this time.

## 2014-07-27 NOTE — Telephone Encounter (Signed)
Last OV 06/29/14 w/ CDY Last refill for hycodan 06/29/14 # 240 ml Please advise if okay to refill thanks

## 2014-07-27 NOTE — Telephone Encounter (Signed)
Ok-given to Tony [name]

## 2014-07-31 ENCOUNTER — Other Ambulatory Visit: Payer: Self-pay | Admitting: Internal Medicine

## 2014-08-01 ENCOUNTER — Other Ambulatory Visit: Payer: Self-pay

## 2014-08-01 MED ORDER — ACYCLOVIR 5 % EX OINT: TOPICAL_OINTMENT | CUTANEOUS | Status: DC

## 2014-08-01 MED ORDER — PHENTERMINE-TOPIRAMATE ER 7.5-46 MG PO CP24: 1.0000 | ORAL_CAPSULE | Freq: Every day | ORAL | Status: DC

## 2014-08-02 ENCOUNTER — Ambulatory Visit: Payer: BC Managed Care – PPO

## 2014-08-02 VITALS — Wt 178.0 lb

## 2014-08-02 DIAGNOSIS — E663 Overweight: Secondary | ICD-10-CM

## 2014-08-25 ENCOUNTER — Encounter: Payer: Self-pay | Admitting: Internal Medicine

## 2014-08-28 ENCOUNTER — Ambulatory Visit: Payer: BC Managed Care – PPO | Admitting: Internal Medicine

## 2014-08-30 ENCOUNTER — Encounter: Payer: Self-pay | Admitting: Internal Medicine

## 2014-08-30 ENCOUNTER — Telehealth: Payer: Self-pay | Admitting: Internal Medicine

## 2014-08-30 ENCOUNTER — Ambulatory Visit (INDEPENDENT_AMBULATORY_CARE_PROVIDER_SITE_OTHER): Payer: BC Managed Care – PPO | Admitting: Internal Medicine

## 2014-08-30 VITALS — BP 130/92 | HR 85 | Temp 97.8°F | Ht 66.5 in | Wt 172.5 lb

## 2014-08-30 DIAGNOSIS — J0100 Acute maxillary sinusitis, unspecified: Secondary | ICD-10-CM

## 2014-08-30 DIAGNOSIS — E663 Overweight: Secondary | ICD-10-CM

## 2014-08-30 MED ORDER — HYDROCODONE-HOMATROPINE 5-1.5 MG/5ML PO SYRP: 5.0000 mL | ORAL_SOLUTION | Freq: Three times a day (TID) | ORAL | Status: DC | PRN

## 2014-08-30 MED ORDER — LIRAGLUTIDE -WEIGHT MANAGEMENT 18 MG/3ML ~~LOC~~ SOPN: 1.8000 mg | PEN_INJECTOR | Freq: Every day | SUBCUTANEOUS | Status: DC

## 2014-08-30 MED ORDER — LEVOFLOXACIN 500 MG PO TABS: 500.0000 mg | ORAL_TABLET | Freq: Every day | ORAL | Status: DC

## 2014-08-30 MED ORDER — AMPHETAMINE-DEXTROAMPHET ER 15 MG PO CP24: 15.0000 mg | ORAL_CAPSULE | ORAL | Status: DC

## 2014-08-30 NOTE — Progress Notes (Signed)
Subjective:    Patient ID: Tony [name], male    DOB: [birth year], 38 y.o.   MRN: [id number]  HPI  Patient here for follow up Reviewed chronic medical issues and interval medical events  Past Medical History  Diagnosis Date  . Asthma   . Anxiety     overlap with depression and ADD  . Hypertension   . Hepatitis A   . Migraines   . OSA on CPAP     Sleep study 12/2011: severe, CPAP 10mmHg  . Allergic rhinitis   . Migraine     Review of Systems  Constitutional: Positive for fatigue. Negative for unexpected weight change (difficulty with weight loss). Fever: low grade.  HENT: Positive for congestion, postnasal drip, rhinorrhea, sinus pressure (nasal discharge, green x 4d, progressively worse) and sore throat. Negative for sneezing.   Respiratory: Positive for cough (x 4 days).   Cardiovascular: Negative for chest pain and leg swelling.       Objective:   Physical Exam  BP 130/92 mmHg  Pulse 85  Temp(Src) 97.8 F (36.6 C) (Oral)  Ht 5' 6.5" (1.689 m)  Wt 172 lb 8 oz (78.245 kg)  BMI 27.43 kg/m2  SpO2 99% Wt Readings from Last 3 Encounters:  08/30/14 172 lb 8 oz (78.245 kg)  08/02/14 178 lb (80.74 kg)  06/29/14 177 lb 6.4 oz (80.468 kg)   Constitutional: he is mildly ill and coughing, but appears well-developed and well-nourished. No distress.  HENT: Mild sinus tenderness to palpation over maxillary sinuses right greater than left, turbinates swollen with thick nasal green discharge. Oropharynx red with evidence of postnasal drip. Neck: Thick, mild shoddy LAD. Normal range of motion. Neck supple. No JVD present. No thyromegaly present.  Cardiovascular: Normal rate, regular rhythm and normal heart sounds.  No murmur heard. No BLE edema. Pulmonary/Chest: Effort normal and breath sounds normal. No respiratory distress. he has no wheezes.  Psychiatric: he has a normal mood and affect. His behavior is normal. Judgment and thought content normal.   Lab Results  Component  Value Date   WBC 4.9 12/16/2012   HGB 15.5 12/16/2012   HCT 45.5 12/16/2012   PLT 329.0 12/16/2012   GLUCOSE 101* 12/16/2012   CHOL 218* 12/16/2012   TRIG 108.0 12/16/2012   HDL 53.10 12/16/2012   LDLDIRECT 144.5 12/16/2012   LDLCALC 110 05/21/2011   ALT 68* 12/16/2012   AST 32 12/16/2012   NA 132* 12/16/2012   K 4.5 12/16/2012   CL 97 12/16/2012   CREATININE 1.3 12/16/2012   BUN 16 12/16/2012   CO2 26 12/16/2012   TSH 2.21 12/16/2012    Dg Chest 2 View  03/10/2014   CLINICAL DATA:  Cough.  Short of breath.  EXAM: CHEST  2 VIEW  COMPARISON:  05/27/2013  FINDINGS: The heart size and mediastinal contours are within normal limits. Both lungs are clear. The visualized skeletal structures are unremarkable.  IMPRESSION: No active cardiopulmonary disease.   Electronically Signed   By: Amie Portlandavid  Ormond M.D.   On: 03/10/2014 11:36       Assessment & Plan:   Acute max sinusitis, progressive symptoms over past 4-5 days. Evaluation by [facility] clinic not in electronic record, reviewed with patient. Ineffective resolution with symptomatic care only. Will add Levaquin 500 mg daily and continue symptomatically treatment by resuming Hydromet given previous improvement with same  Problem List Items Addressed This Visit    Overweight - Primary    Wt Readings from Last 3  Encounters:  08/30/14 172 lb 8 oz (78.245 kg)  08/02/14 178 lb (80.74 kg)  06/29/14 177 lb 6.4 oz (80.468 kg)   Intentional 30 pound weight loss since May 2014 reviewed -  now BMI<30 but has plateau'ed Given co-dx HTN and OSA, ok for weight loss assistance med Co-tx with adderal prohibits stimulant or contrave 12/2013 trial belviq -ineffective, no change 02/2014 requested trial qysmia -ineffective, and mentally not doing well off Adderall Request new prescription for Saxenda - will rx today  we reviewed potential risk/benefit and possible side effects - pt understands and agrees to same  Encouraged ongoing exercise and diet  efforts

## 2014-08-30 NOTE — Patient Instructions (Signed)
It was good to see you today.  We have reviewed your prior records including labs and tests today  For weight loss assistance, stop Qysmia and begin Saxenda For sinusitis and cough, Levaquin antibiotics once daily for 7 days and Hydromet syrup as needed  Your prescription(s) have been submitted to your pharmacy. Please take as directed and contact our office if you believe you are having problem(s) with the medication(s).  Other medications reviewed and updated, no additonal changes recommended at this time.  Please schedule followup in 6-8 weeks to review weight loss efforts and monitor blood pressure , call sooner if problems.  Work excuse note provided today as requested

## 2014-08-30 NOTE — Telephone Encounter (Signed)
Patient is requesting you to give him a call.  Would not say why.

## 2014-08-30 NOTE — Progress Notes (Signed)
Pre visit review using our clinic review tool, if applicable. No additional management support is needed unless otherwise documented below in the visit note. 

## 2014-08-30 NOTE — Assessment & Plan Note (Signed)
Wt Readings from Last 3 Encounters:  08/30/14 172 lb 8 oz (78.245 kg)  08/02/14 178 lb (80.74 kg)  06/29/14 177 lb 6.4 oz (80.468 kg)   Intentional 30 pound weight loss since May 2014 reviewed -  now BMI<30 but has plateau'ed Given co-dx HTN and OSA, ok for weight loss assistance med Co-tx with adderal prohibits stimulant or contrave 12/2013 trial belviq -ineffective, no change 02/2014 requested trial qysmia -ineffective, and mentally not doing well off Adderall Request new prescription for Saxenda - will rx today  we reviewed potential risk/benefit and possible side effects - pt understands and agrees to same  Encouraged ongoing exercise and diet efforts

## 2014-08-31 ENCOUNTER — Encounter: Payer: Self-pay | Admitting: Internal Medicine

## 2014-08-31 NOTE — Telephone Encounter (Signed)
Pt wanted to let us know to keep an eye out for a PA.

## 2014-09-01 ENCOUNTER — Encounter: Payer: Self-pay | Admitting: Internal Medicine

## 2014-09-01 NOTE — Telephone Encounter (Signed)
Patient is requesting PA to be processed.  Patient will get from pharmacy and fax over.

## 2014-09-04 ENCOUNTER — Other Ambulatory Visit (INDEPENDENT_AMBULATORY_CARE_PROVIDER_SITE_OTHER): Payer: BC Managed Care – PPO

## 2014-09-04 ENCOUNTER — Other Ambulatory Visit: Payer: Self-pay | Admitting: Internal Medicine

## 2014-09-04 DIAGNOSIS — Z0000 Encounter for general adult medical examination without abnormal findings: Secondary | ICD-10-CM

## 2014-09-04 LAB — LIPID PANEL
Cholesterol: 173 mg/dL (ref 0–200)
HDL: 49.9 mg/dL (ref >39.00)
LDL Cholesterol: 112 mg/dL — ABNORMAL HIGH (ref 0–99)
NonHDL: 123.1
Total CHOL/HDL Ratio: 3
Triglycerides: 56 mg/dL (ref 0.0–149.0)
VLDL: 11.2 mg/dL (ref 0.0–40.0)

## 2014-09-05 NOTE — Telephone Encounter (Signed)
PA for saxenda has been approved.  

## 2014-09-13 ENCOUNTER — Encounter: Payer: Self-pay | Admitting: Internal Medicine

## 2014-09-13 MED ORDER — LEVOFLOXACIN 500 MG PO TABS: 500.0000 mg | ORAL_TABLET | Freq: Every day | ORAL | Status: DC

## 2014-09-13 NOTE — Telephone Encounter (Signed)
[place] [facility] [place]

## 2014-09-13 NOTE — Telephone Encounter (Signed)
Please call in and did prescription of Levaquin to [facility] pharmacy as requested (see patient instruction in my chart message)

## 2014-10-03 ENCOUNTER — Other Ambulatory Visit: Payer: Self-pay | Admitting: Internal Medicine

## 2014-10-05 MED ORDER — HYDROCODONE-HOMATROPINE 5-1.5 MG/5ML PO SYRP: 5.0000 mL | ORAL_SOLUTION | Freq: Three times a day (TID) | ORAL | Status: DC | PRN

## 2014-10-05 MED ORDER — AMPHETAMINE-DEXTROAMPHET ER 15 MG PO CP24: 15.0000 mg | ORAL_CAPSULE | ORAL | Status: DC

## 2014-10-12 ENCOUNTER — Ambulatory Visit: Payer: BC Managed Care – PPO | Admitting: Internal Medicine

## 2014-10-26 ENCOUNTER — Encounter: Payer: Self-pay | Admitting: Internal Medicine

## 2014-10-26 MED ORDER — LIRAGLUTIDE -WEIGHT MANAGEMENT 18 MG/3ML ~~LOC~~ SOPN: 2.4000 mg | PEN_INJECTOR | Freq: Every day | SUBCUTANEOUS | Status: DC

## 2014-11-04 ENCOUNTER — Other Ambulatory Visit: Payer: Self-pay | Admitting: Internal Medicine

## 2014-11-06 ENCOUNTER — Other Ambulatory Visit: Payer: Self-pay

## 2014-11-06 MED ORDER — AMPHETAMINE-DEXTROAMPHET ER 15 MG PO CP24: 15.0000 mg | ORAL_CAPSULE | ORAL | Status: DC

## 2014-12-06 ENCOUNTER — Encounter: Payer: Self-pay | Admitting: Internal Medicine

## 2014-12-06 NOTE — Telephone Encounter (Signed)
Pt requesting increase in Adderral. Pt has an appt already scheduled in May and could use that appt as a follow up to the rx dosage increase. Pt sent a message for your review as well.

## 2014-12-07 ENCOUNTER — Other Ambulatory Visit: Payer: Self-pay | Admitting: Internal Medicine

## 2014-12-07 ENCOUNTER — Other Ambulatory Visit: Payer: Self-pay

## 2014-12-07 MED ORDER — AMPHETAMINE-DEXTROAMPHET ER 15 MG PO CP24: 15.0000 mg | ORAL_CAPSULE | ORAL | Status: DC

## 2014-12-08 NOTE — Telephone Encounter (Signed)
Pt called in said she pick up script but i was suppose to be increased?  She is not going to fill it til she hears from you  Best number [phone]

## 2014-12-12 MED ORDER — AMPHETAMINE-DEXTROAMPHET ER 20 MG PO CP24: 20.0000 mg | ORAL_CAPSULE | ORAL | Status: DC

## 2014-12-12 NOTE — Telephone Encounter (Signed)
rx printed and signed for pick up thanks

## 2014-12-21 ENCOUNTER — Encounter: Payer: Self-pay | Admitting: Internal Medicine

## 2014-12-21 ENCOUNTER — Ambulatory Visit (INDEPENDENT_AMBULATORY_CARE_PROVIDER_SITE_OTHER): Payer: BLUE CROSS/BLUE SHIELD | Admitting: Internal Medicine

## 2014-12-21 VITALS — BP 118/84 | HR 84 | Temp 98.7°F | Resp 12 | Ht 66.5 in | Wt 165.8 lb

## 2014-12-21 DIAGNOSIS — E663 Overweight: Secondary | ICD-10-CM

## 2014-12-21 MED ORDER — NAPROXEN 500 MG PO TABS: 500.0000 mg | ORAL_TABLET | Freq: Two times a day (BID) | ORAL | Status: DC

## 2014-12-21 NOTE — Progress Notes (Signed)
Pre visit review using our clinic review tool, if applicable. No additional management support is needed unless otherwise documented below in the visit note. 

## 2014-12-21 NOTE — Patient Instructions (Addendum)
We have sent in the naproxen which you can take 2 times a day (we recommend with food) to help the shoulders. I would take it for 1-2 weeks then as needed.   It is okay to take 1-2 pills of the xanax for stressful situations.   I generally do not prescribe ADD medication and if you decide to switch to me I would send you to an ADD specialist for management of that but would feel comfortable managing all your other conditions.   Come back with Dr. Felicity CoyerLeschber as previously scheduled.

## 2014-12-22 NOTE — Progress Notes (Signed)
   Subjective:    Patient ID: Tony [name], male    DOB: 05-17-1976, 39 y.o.   MRN: [id number]  HPI The patient is a 39 YO man who is coming in to follow up on his weights. He started using glp-1 agonist daily for his weight and has been able to lose about 6 pounds since that time. He previously had lost a lot of weight alone (without medicine) but had hit plateau. He is also working out much more. He is working on his food as well. Denies side effects.   Review of Systems  Constitutional: Positive for activity change. Negative for appetite change, fatigue and unexpected weight change.       Working out more  Cardiovascular: Negative.   Gastrointestinal: Negative.   Neurological: Negative.       Objective:   Physical Exam  Constitutional: He appears well-developed and well-nourished.  HENT:  Head: Normocephalic and atraumatic.  Eyes: EOM are normal.  Cardiovascular: Normal rate and regular rhythm.   Pulmonary/Chest: Effort normal and breath sounds normal.  Abdominal: Soft.  Skin: Skin is warm and dry.   Filed Vitals:   12/21/14 1420  BP: 118/84  Pulse: 84  Temp: 98.7 F (37.1 C)  Resp: 12  Height: 5' 6.5" (1.689 m)  Weight: 165 lb 12.8 oz (75.206 kg)  SpO2: 98%      Assessment & Plan:

## 2014-12-22 NOTE — Assessment & Plan Note (Signed)
Doing well on saxenda and is down 6 pounds. Will continue and advised him to continue with the increased exercise and better diet.

## 2015-01-05 ENCOUNTER — Telehealth: Payer: Self-pay

## 2015-01-05 NOTE — Telephone Encounter (Signed)
4 mo PA review sent via cover my meds via key that was sent.

## 2015-01-09 ENCOUNTER — Telehealth: Payer: Self-pay | Admitting: Internal Medicine

## 2015-01-09 ENCOUNTER — Other Ambulatory Visit: Payer: Self-pay | Admitting: Internal Medicine

## 2015-01-09 NOTE — Telephone Encounter (Signed)
Patient had appt for 5/12.  It is for cpe and med fu.  He seen Dr. Dorise HissKollar once but states that Dr. Dorise HissKollar was not comfortable treating his ADHD.  He is requesting to get worked in sooner.

## 2015-01-10 MED ORDER — SUMATRIPTAN SUCCINATE 50 MG PO TABS: 50.0000 mg | ORAL_TABLET | ORAL | Status: DC | PRN

## 2015-01-10 MED ORDER — AMPHETAMINE-DEXTROAMPHET ER 20 MG PO CP24: 20.0000 mg | ORAL_CAPSULE | ORAL | Status: DC

## 2015-01-10 MED ORDER — INDOMETHACIN 25 MG PO CAPS: 25.0000 mg | ORAL_CAPSULE | Freq: Three times a day (TID) | ORAL | Status: DC

## 2015-01-10 MED ORDER — INSULIN PEN NEEDLE 32G X 6 MM MISC: Status: DC

## 2015-01-10 MED ORDER — LOSARTAN POTASSIUM-HCTZ 50-12.5 MG PO TABS: 1.0000 | ORAL_TABLET | Freq: Every day | ORAL | Status: DC

## 2015-01-10 MED ORDER — LEVALBUTEROL TARTRATE 45 MCG/ACT IN AERO: 2.0000 | INHALATION_SPRAY | Freq: Four times a day (QID) | RESPIRATORY_TRACT | Status: DC | PRN

## 2015-02-01 ENCOUNTER — Encounter: Payer: Self-pay | Admitting: Internal Medicine

## 2015-02-06 ENCOUNTER — Encounter: Payer: Self-pay | Admitting: Internal Medicine

## 2015-02-06 ENCOUNTER — Ambulatory Visit (INDEPENDENT_AMBULATORY_CARE_PROVIDER_SITE_OTHER): Payer: BLUE CROSS/BLUE SHIELD | Admitting: Internal Medicine

## 2015-02-06 ENCOUNTER — Other Ambulatory Visit (INDEPENDENT_AMBULATORY_CARE_PROVIDER_SITE_OTHER): Payer: BLUE CROSS/BLUE SHIELD

## 2015-02-06 ENCOUNTER — Other Ambulatory Visit: Payer: Self-pay

## 2015-02-06 VITALS — BP 120/74 | HR 74 | Temp 97.9°F | Ht 66.5 in | Wt 163.0 lb

## 2015-02-06 DIAGNOSIS — E663 Overweight: Secondary | ICD-10-CM | POA: Diagnosis not present

## 2015-02-06 DIAGNOSIS — F909 Attention-deficit hyperactivity disorder, unspecified type: Secondary | ICD-10-CM | POA: Diagnosis not present

## 2015-02-06 DIAGNOSIS — I10 Essential (primary) hypertension: Secondary | ICD-10-CM

## 2015-02-06 DIAGNOSIS — Z0000 Encounter for general adult medical examination without abnormal findings: Secondary | ICD-10-CM

## 2015-02-06 DIAGNOSIS — F988 Other specified behavioral and emotional disorders with onset usually occurring in childhood and adolescence: Secondary | ICD-10-CM

## 2015-02-06 LAB — HEPATIC FUNCTION PANEL
ALK PHOS: 76 U/L (ref 39–117)
ALT: 36 U/L (ref 0–53)
AST: 23 U/L (ref 0–37)
Albumin: 4.9 g/dL (ref 3.5–5.2)
Bilirubin, Direct: 0.1 mg/dL (ref 0.0–0.3)
Total Bilirubin: 0.4 mg/dL (ref 0.2–1.2)
Total Protein: 7.5 g/dL (ref 6.0–8.3)

## 2015-02-06 LAB — TSH: TSH: 0.88 u[IU]/mL (ref 0.35–4.50)

## 2015-02-06 LAB — CBC WITH DIFFERENTIAL/PLATELET
Basophils Absolute: 0 10*3/uL (ref 0.0–0.1)
Basophils Relative: 0.5 % (ref 0.0–3.0)
EOS ABS: 0.3 10*3/uL (ref 0.0–0.7)
Eosinophils Relative: 4.2 % (ref 0.0–5.0)
HCT: 46.5 % (ref 39.0–52.0)
HEMOGLOBIN: 15.8 g/dL (ref 13.0–17.0)
Lymphocytes Relative: 32.8 % (ref 12.0–46.0)
Lymphs Abs: 2 10*3/uL (ref 0.7–4.0)
MCHC: 34 g/dL (ref 30.0–36.0)
MCV: 84.9 fl (ref 78.0–100.0)
MONO ABS: 0.8 10*3/uL (ref 0.1–1.0)
Monocytes Relative: 12.2 % — ABNORMAL HIGH (ref 3.0–12.0)
NEUTROS ABS: 3.1 10*3/uL (ref 1.4–7.7)
Neutrophils Relative %: 50.3 % (ref 43.0–77.0)
Platelets: 331 10*3/uL (ref 150.0–400.0)
RBC: 5.48 Mil/uL (ref 4.22–5.81)
RDW: 13.9 % (ref 11.5–15.5)
WBC: 6.2 10*3/uL (ref 4.0–10.5)

## 2015-02-06 LAB — BASIC METABOLIC PANEL
BUN: 19 mg/dL (ref 6–23)
CO2: 33 meq/L — AB (ref 19–32)
Calcium: 9.9 mg/dL (ref 8.4–10.5)
Chloride: 99 mEq/L (ref 96–112)
Creatinine, Ser: 1.01 mg/dL (ref 0.40–1.50)
GFR: 87.1 mL/min (ref >60.00)
Glucose, Bld: 95 mg/dL (ref 70–99)
POTASSIUM: 3.9 meq/L (ref 3.5–5.1)
SODIUM: 137 meq/L (ref 135–145)

## 2015-02-06 LAB — URINALYSIS, ROUTINE W REFLEX MICROSCOPIC
BILIRUBIN URINE: NEGATIVE
HGB URINE DIPSTICK: NEGATIVE
Ketones, ur: NEGATIVE
LEUKOCYTES UA: NEGATIVE
NITRITE: NEGATIVE
RBC / HPF: NONE SEEN (ref 0–?)
Specific Gravity, Urine: 1.02 (ref 1.000–1.030)
TOTAL PROTEIN, URINE-UPE24: NEGATIVE
Urine Glucose: NEGATIVE
Urobilinogen, UA: 0.2 (ref 0.0–1.0)
WBC, UA: NONE SEEN (ref 0–?)
pH: 6 (ref 5.0–8.0)

## 2015-02-06 MED ORDER — INDOMETHACIN 50 MG PO CAPS: 50.0000 mg | ORAL_CAPSULE | Freq: Three times a day (TID) | ORAL | Status: DC | PRN

## 2015-02-06 MED ORDER — AMPHETAMINE-DEXTROAMPHET ER 20 MG PO CP24: 20.0000 mg | ORAL_CAPSULE | ORAL | Status: DC

## 2015-02-06 MED ORDER — LOSARTAN POTASSIUM-HCTZ 50-12.5 MG PO TABS: 1.0000 | ORAL_TABLET | Freq: Every day | ORAL | Status: DC

## 2015-02-06 MED ORDER — ALPRAZOLAM 0.5 MG PO TABS: 0.5000 mg | ORAL_TABLET | Freq: Two times a day (BID) | ORAL | Status: DC | PRN

## 2015-02-06 MED ORDER — AMPHETAMINE-DEXTROAMPHETAMINE 10 MG PO TABS: 10.0000 mg | ORAL_TABLET | Freq: Two times a day (BID) | ORAL | Status: DC | PRN

## 2015-02-06 MED ORDER — CETIRIZINE HCL 10 MG PO TABS: 10.0000 mg | ORAL_TABLET | Freq: Every day | ORAL | Status: DC

## 2015-02-06 NOTE — Progress Notes (Signed)
Pre visit review using our clinic review tool, if applicable. No additional management support is needed unless otherwise documented below in the visit note. 

## 2015-02-06 NOTE — Assessment & Plan Note (Signed)
Wt Readings from Last 3 Encounters:  02/06/15 163 lb (73.936 kg)  12/21/14 165 lb 12.8 oz (75.206 kg)  08/30/14 172 lb 8 oz (78.245 kg)   Intentional 30 pound weight loss since May 2014 reviewed -  now BMI<30 but has plateau'ed (early 2015) Given co-dx HTN and OSA, ok for weight loss assistance med Co-tx with adderal prohibits stimulant or contrave 12/2013 trial belviq -ineffective, no change 02/2014 requested trial qysmia -ineffective, and mentally not doing well off Adderall Request new prescription for Saxenda early 2016 - doing well The current medical regimen is effective;  continue present plan and medications.   Encouraged ongoing exercise and diet efforts Goal wt 142-150# reviewed

## 2015-02-06 NOTE — Patient Instructions (Signed)
It was good to see you today.  We have reviewed your prior records including labs and tests today  Health Maintenance reviewed - all recommended immunizations and age-appropriate screenings are up-to-date.  Test(s) ordered today. Return in next 2 weeks for same. Your results will be released to MyChart (or called to you) after review, usually within 72hours after test completion. If any changes need to be made, you will be notified at that same time.  Medications reviewed and updated  Use short acting Adderall as needed in addition to long-acting daily as discussed. Increase dose of indomethacin as discussed No other changes recommended at this time. Your prescription(s) have been submitted to your pharmacy. Please take as directed and contact our office if you believe you are having problem(s) with the medication(s).  Please schedule followup in 6 months for semiannual exam and labs, call sooner if problems.

## 2015-02-06 NOTE — Assessment & Plan Note (Signed)
BP Readings from Last 3 Encounters:  02/06/15 120/74  12/21/14 118/84  08/30/14 130/92   Added diuretic to ongoing ACE inhibitor 11/2012 Improving with weight loss, but not resolved Reduced dose summer 2014 but reports not controlled at home so will resume full tab qd summer 2015 The current medical regimen is effective;  continue present plan and medications.

## 2015-02-06 NOTE — Assessment & Plan Note (Signed)
Increased Adderall xr to 15mg qd  01/2013, then 20mg qd fall 2015 due to demands of school - overall doing well Will use short acting as needed in addition to long-acting for periods of stress/testing New prescription for short acting as well as long-acting refill today - rx done 

## 2015-02-06 NOTE — Progress Notes (Signed)
Subjective:    Patient ID: Tony [name], male    DOB: [birth year], 39 y.o.   MRN: [id number]  HPI  patient is here today for annual physical. Patient feels well and has no complaints. Also reviewed chronic conditions, interval events and current concerns  Past Medical History  Diagnosis Date  . Asthma   . Anxiety     overlap with depression and ADD  . Hypertension   . Hepatitis A   . Migraines   . OSA on CPAP     Sleep study 12/2011: severe, CPAP 10mmHg  . Allergic rhinitis   . Migraine    Family History  Problem Relation Age of Onset  . Hypertension Mother   . Arthritis Father   . Hypertension Father   . Diabetes Other   . Emphysema Paternal Grandfather   . Asthma Paternal Grandfather   . Lung cancer Maternal Grandfather    History  Substance Use Topics  . Smoking status: Former Smoker -- 0.24 packs/day for 3 years    Types: Cigarettes    Quit date: 09/22/2008  . Smokeless tobacco: Not on file  . Alcohol Use: 0.0 oz/week    0 Standard drinks or equivalent per week     Comment: OCCASSIONAL    Review of Systems  Constitutional: Negative for fever, activity change, appetite change, fatigue and unexpected weight change.  Respiratory: Negative for cough, chest tightness, shortness of breath and wheezing.   Cardiovascular: Negative for chest pain, palpitations and leg swelling.  Neurological: Negative for dizziness, weakness and headaches.  Psychiatric/Behavioral: Negative for dysphoric mood. The patient is not nervous/anxious.   All other systems reviewed and are negative.      Objective:    Physical Exam  Constitutional: He is oriented to person, place, and time. He appears well-developed and well-nourished. No distress.  HENT:  Head: Normocephalic and atraumatic.  Nose: Nose normal.  Mouth/Throat: Oropharynx is clear and moist.  Hearing grossly normal.  Eyes: Conjunctivae and EOM are normal. Pupils are equal, round, and reactive to light. No scleral  icterus.  Neck: Normal range of motion. Neck supple. No JVD present. No thyromegaly present.  Cardiovascular: Normal rate, regular rhythm, normal heart sounds and intact distal pulses.  Exam reveals no friction rub.   No murmur heard. No edema.  Pulmonary/Chest: Effort normal and breath sounds normal. No respiratory distress. He has no wheezes.  Abdominal: Soft. Bowel sounds are normal. He exhibits no distension and no mass. There is no tenderness. There is no guarding.  Genitourinary:  defer  Musculoskeletal: Normal range of motion. He exhibits no edema or tenderness.  Lymphadenopathy:    He has no cervical adenopathy.  Neurological: He is alert and oriented to person, place, and time. He has normal reflexes. No cranial nerve deficit.  Skin: Skin is warm and dry. No rash noted. No erythema.  Psychiatric: He has a normal mood and affect. His behavior is normal. Thought content normal.    BP 120/74 mmHg  Pulse 74  Temp(Src) 97.9 F (36.6 C) (Oral)  Ht 5' 6.5" (1.689 m)  Wt 163 lb (73.936 kg)  BMI 25.92 kg/m2  SpO2 99% Wt Readings from Last 3 Encounters:  02/06/15 163 lb (73.936 kg)  12/21/14 165 lb 12.8 oz (75.206 kg)  08/30/14 172 lb 8 oz (78.245 kg)    Lab Results  Component Value Date   WBC 4.9 12/16/2012   HGB 15.5 12/16/2012   HCT 45.5 12/16/2012   PLT 329.0 12/16/2012  GLUCOSE 101* 12/16/2012   CHOL 173 09/04/2014   TRIG 56.0 09/04/2014   HDL 49.90 09/04/2014   LDLDIRECT 144.5 12/16/2012   LDLCALC 112* 09/04/2014   ALT 68* 12/16/2012   AST 32 12/16/2012   NA 132* 12/16/2012   K 4.5 12/16/2012   CL 97 12/16/2012   CREATININE 1.3 12/16/2012   BUN 16 12/16/2012   CO2 26 12/16/2012   TSH 2.21 12/16/2012    Dg Chest 2 View  03/10/2014   CLINICAL DATA:  Cough.  Short of breath.  EXAM: CHEST  2 VIEW  COMPARISON:  05/27/2013  FINDINGS: The heart size and mediastinal contours are within normal limits. Both lungs are clear. The visualized skeletal structures are  unremarkable.  IMPRESSION: No active cardiopulmonary disease.   Electronically Signed   By: Amie Portlandavid  Ormond M.D.   On: 03/10/2014 11:36       Assessment & Plan:   CPX/z00.00 - Patient has been counseled on age-appropriate routine health concerns for screening and prevention. These are reviewed and up-to-date. Immunizations are up-to-date or declined. Labs ordered and reviewed.  Problem List Items Addressed This Visit    ADD (attention deficit disorder)    Increased Adderall xr to 15mg  qd  01/2013, then 20mg  qd fall 2015 due to demands of school - overall doing well Will use short acting as needed in addition to long-acting for periods of stress/testing New prescription for short acting as well as long-acting refill today - rx done       Essential hypertension    BP Readings from Last 3 Encounters:  02/06/15 120/74  12/21/14 118/84  08/30/14 130/92   Added diuretic to ongoing ACE inhibitor 11/2012 Improving with weight loss, but not resolved Reduced dose summer 2014 but reports not controlled at home so will resume full tab qd summer 2015 The current medical regimen is effective;  continue present plan and medications.       Relevant Medications   losartan-hydrochlorothiazide (HYZAAR) 50-12.5 MG per tablet   Overweight    Wt Readings from Last 3 Encounters:  02/06/15 163 lb (73.936 kg)  12/21/14 165 lb 12.8 oz (75.206 kg)  08/30/14 172 lb 8 oz (78.245 kg)   Intentional 30 pound weight loss since May 2014 reviewed -  now BMI<30 but has plateau'ed (early 2015) Given co-dx HTN and OSA, ok for weight loss assistance med Co-tx with adderal prohibits stimulant or contrave 12/2013 trial belviq -ineffective, no change 02/2014 requested trial qysmia -ineffective, and mentally not doing well off Adderall Request new prescription for Saxenda early 2016 - doing well The current medical regimen is effective;  continue present plan and medications.   Encouraged ongoing exercise and diet  efforts Goal wt 142-150# reviewed       Other Visit Diagnoses    Routine general medical examination at a health care facility    -  Primary    Relevant Orders    Basic metabolic panel    CBC with Differential/Platelet    Hepatic function panel    TSH    Urinalysis, Routine w reflex microscopic        Rene PaciValerie Leschber, MD

## 2015-04-14 ENCOUNTER — Other Ambulatory Visit: Payer: Self-pay | Admitting: Internal Medicine

## 2015-04-16 MED ORDER — AMPHETAMINE-DEXTROAMPHET ER 20 MG PO CP24: 20.0000 mg | ORAL_CAPSULE | ORAL | Status: DC

## 2015-04-16 MED ORDER — AMPHETAMINE-DEXTROAMPHETAMINE 10 MG PO TABS: 10.0000 mg | ORAL_TABLET | Freq: Two times a day (BID) | ORAL | Status: DC | PRN

## 2015-04-16 NOTE — Addendum Note (Signed)
Addended by: Tonye Becket on: 04/16/2015 02:37 PM   Modules accepted: Orders

## 2015-04-18 ENCOUNTER — Other Ambulatory Visit: Payer: Self-pay

## 2015-04-18 MED ORDER — AMPHETAMINE-DEXTROAMPHET ER 20 MG PO CP24: 20.0000 mg | ORAL_CAPSULE | ORAL | Status: DC

## 2015-05-14 ENCOUNTER — Other Ambulatory Visit: Payer: Self-pay | Admitting: Internal Medicine

## 2015-05-15 ENCOUNTER — Other Ambulatory Visit: Payer: Self-pay

## 2015-05-15 MED ORDER — LIRAGLUTIDE -WEIGHT MANAGEMENT 18 MG/3ML ~~LOC~~ SOPN: 3.0000 mg | PEN_INJECTOR | Freq: Every day | SUBCUTANEOUS | Status: DC

## 2015-05-15 NOTE — Progress Notes (Signed)
Ok to increase to max dose /d

## 2015-05-22 ENCOUNTER — Telehealth: Payer: Self-pay | Admitting: Internal Medicine

## 2015-05-22 MED ORDER — AMPHETAMINE-DEXTROAMPHET ER 20 MG PO CP24: 20.0000 mg | ORAL_CAPSULE | ORAL | Status: DC

## 2015-05-22 NOTE — Telephone Encounter (Signed)
Sent mychart msg stating rx ready for pick-up

## 2015-05-22 NOTE — Telephone Encounter (Signed)
Please call and let him know rx ready for pickup. Please UDS at pickup.

## 2015-05-22 NOTE — Addendum Note (Signed)
Addended by: Genella Mech A on: 05/22/2015 11:52 AM   Modules accepted: Orders

## 2015-05-23 ENCOUNTER — Encounter: Payer: Self-pay | Admitting: Internal Medicine

## 2015-05-23 NOTE — Telephone Encounter (Signed)
PA for Saxenda seems to be approved. Working with pharmacy to see if it is approved.

## 2015-05-24 ENCOUNTER — Telehealth: Payer: Self-pay | Admitting: *Deleted

## 2015-05-24 NOTE — Telephone Encounter (Signed)
Pt called and she states that she will need a prior authorization for amphetamine-dextroamphetamine (ADDERALL XR) 20 MG 24 hr capsule [[id number]]  as well

## 2015-05-24 NOTE — Telephone Encounter (Signed)
PA are done by md assistant forwarding msg back to [facility] to follow-up on PA...Raechel Chute

## 2015-05-24 NOTE — Telephone Encounter (Signed)
Receive call from pharmacist she stated PA was submitted to the wrong insurance. Need PA resubmitted to correct insurance [phone] Member ID [id number]

## 2015-05-25 ENCOUNTER — Telehealth: Payer: Self-pay

## 2015-05-25 NOTE — Telephone Encounter (Signed)
The pharmacy has the pts new insurance information, we do not and that is why the PA showed not needed on my end.

## 2015-05-25 NOTE — Telephone Encounter (Signed)
The pharmacy has the pts new insurance information, we do not and that is why the PA showed not needed on my end.  

## 2015-05-29 NOTE — Telephone Encounter (Signed)
Both PA for Adderall and Saxenda were approved.

## 2015-05-31 ENCOUNTER — Encounter: Payer: Self-pay | Admitting: Internal Medicine

## 2015-05-31 ENCOUNTER — Telehealth: Payer: Self-pay | Admitting: Internal Medicine

## 2015-05-31 ENCOUNTER — Ambulatory Visit (INDEPENDENT_AMBULATORY_CARE_PROVIDER_SITE_OTHER): Payer: BC Managed Care – PPO | Admitting: Internal Medicine

## 2015-05-31 VITALS — BP 122/78 | HR 72 | Temp 97.8°F | Ht 66.5 in | Wt 170.2 lb

## 2015-05-31 DIAGNOSIS — F331 Major depressive disorder, recurrent, moderate: Secondary | ICD-10-CM

## 2015-05-31 DIAGNOSIS — F329 Major depressive disorder, single episode, unspecified: Secondary | ICD-10-CM | POA: Insufficient documentation

## 2015-05-31 DIAGNOSIS — E663 Overweight: Secondary | ICD-10-CM

## 2015-05-31 DIAGNOSIS — M62838 Other muscle spasm: Secondary | ICD-10-CM

## 2015-05-31 DIAGNOSIS — M6248 Contracture of muscle, other site: Secondary | ICD-10-CM | POA: Diagnosis not present

## 2015-05-31 DIAGNOSIS — F32A Depression, unspecified: Secondary | ICD-10-CM | POA: Insufficient documentation

## 2015-05-31 DIAGNOSIS — Z23 Encounter for immunization: Secondary | ICD-10-CM | POA: Diagnosis not present

## 2015-05-31 MED ORDER — SERTRALINE HCL 25 MG PO TABS: 25.0000 mg | ORAL_TABLET | Freq: Every day | ORAL | Status: DC

## 2015-05-31 MED ORDER — CYCLOBENZAPRINE HCL 5 MG PO TABS: 5.0000 mg | ORAL_TABLET | Freq: Three times a day (TID) | ORAL | Status: DC | PRN

## 2015-05-31 NOTE — Telephone Encounter (Signed)
resubmitting PA for the Adderall XR instead of the ER.

## 2015-05-31 NOTE — Assessment & Plan Note (Signed)
Long history of same, depression with overlapping anxiety and ADD, previously under psychiatry care until 2010 Reports previous trials of Paxil ineffective Also previously tried on Cymbalta, Abilify, Lamictal mood stabilizer in past - but reports stopping all in 2010 because "doses were too high" increasing depression with anxiety symptoms reviewed, manifest with excessive fatigue, worry, distractibility and irritability repeat discussion regarding medication options and behavioral health therapies for treatment of same Tried resumed Cymbalta 30 mg qd, low-dose in 2014 but self DC'd due to sweats Will retry low dose sertraline now and refer to psyc care in [place] area continue Adderall XR alprazolam dose (0.5 mg twice a day when necessary) s/p single evaluation by Dr Noe Gens for behv health counseling 12/2012 - no plans for follow up  Verified no SI/HI, pt will call if problems unimproved with med changes or if symptoms worse

## 2015-05-31 NOTE — Telephone Encounter (Signed)
Pharmacy is calling regarding a prior approval for amphetamine-dextroamphetamine (ADDERALL XR) 20 MG 24 hr capsule [[id number]]  She has some information from the insurance company. Please give them a call

## 2015-05-31 NOTE — Progress Notes (Signed)
Subjective:    Patient ID: Tony [name], male    DOB: Apr 30, 1976, 39 y.o.   MRN: [id number]  HPI  Patient here for follow up - Also reviewed chronic medical conditions, interval events and current concerns  Past Medical History  Diagnosis Date  . Asthma   . Anxiety     overlap with depression and ADD  . Hypertension   . Hepatitis A   . Migraines   . OSA on CPAP     Sleep study 12/2011: severe, CPAP  . Allergic rhinitis   . Migraine     Review of Systems  Constitutional: Positive for fatigue. Negative for unexpected weight change.  Respiratory: Negative for cough and shortness of breath.   Cardiovascular: Negative for chest pain and leg swelling.  Psychiatric/Behavioral: Positive for dysphoric mood and decreased concentration. Negative for suicidal ideas and self-injury.       Objective:    Physical Exam  Constitutional: He is oriented to person, place, and time. He appears well-developed and well-nourished. No distress.  overweight  Cardiovascular: Normal rate, regular rhythm and normal heart sounds.   No murmur heard. Pulmonary/Chest: Effort normal and breath sounds normal. No respiratory distress.  Musculoskeletal: Normal range of motion.  Spasm B trap/neck from tension  Neurological: He is alert and oriented to person, place, and time. No cranial nerve deficit. He exhibits normal muscle tone. Coordination normal.  Psychiatric: His behavior is normal. Judgment and thought content normal.  Flattened dysphoric mood    BP 122/78 mmHg  Pulse 72  Temp(Src) 97.8 F (36.6 C) (Oral)  Ht 5' 6.5" (1.689 m)  Wt 170 lb 4 oz (77.225 kg)  BMI 27.07 kg/m2  SpO2 98% Wt Readings from Last 3 Encounters:  05/31/15 170 lb 4 oz (77.225 kg)  02/06/15 163 lb (73.936 kg)  12/21/14 165 lb 12.8 oz (75.206 kg)    Lab Results  Component Value Date   WBC 6.2 02/06/2015   HGB 15.8 02/06/2015   HCT 46.5 02/06/2015   PLT 331.0 02/06/2015   GLUCOSE 95 02/06/2015   CHOL  173 09/04/2014   TRIG 56.0 09/04/2014   HDL 49.90 09/04/2014   LDLDIRECT 144.5 12/16/2012   LDLCALC 112* 09/04/2014   ALT 36 02/06/2015   AST 23 02/06/2015   NA 137 02/06/2015   K 3.9 02/06/2015   CL 99 02/06/2015   CREATININE 1.01 02/06/2015   BUN 19 02/06/2015   CO2 33* 02/06/2015   TSH 0.88 02/06/2015    Dg Chest 2 View  03/10/2014   CLINICAL DATA:  Cough.  Short of breath.  EXAM: CHEST  2 VIEW  COMPARISON:  05/27/2013  FINDINGS: The heart size and mediastinal contours are within normal limits. Both lungs are clear. The visualized skeletal structures are unremarkable.  IMPRESSION: No active cardiopulmonary disease.   Electronically Signed   By: Amie Portland M.D.   On: 03/10/2014 11:36       Assessment & Plan:   muscle spasm - suspect tension from depression and studying activity - no injury, numbness or weakness, no radiation into arms - flexeril prn - call if worse for PT refer  Problem List Items Addressed This Visit    Major depressive disorder, recurrent episode, moderate - Primary    Long history of same, depression with overlapping anxiety and ADD, previously under psychiatry care until 2010 Reports previous trials of Paxil ineffective Also previously tried on Cymbalta, Abilify, Lamictal mood stabilizer in past - but reports stopping all  in 2010 because "doses were too high" increasing depression with anxiety symptoms reviewed, manifest with excessive fatigue, worry, distractibility and irritability repeat discussion regarding medication options and behavioral health therapies for treatment of same Tried resumed Cymbalta 30 mg qd, low-dose in 2014 but self DC'd due to sweats Will retry low dose sertraline now and refer to psyc care in [place] area continue Adderall XR alprazolam dose (0.5 mg twice a day when necessary) s/p single evaluation by Dr Noe Gens for behv health counseling 12/2012 - no plans for follow up  Verified no SI/HI, pt will call if problems unimproved with med  changes or if symptoms worse      Relevant Medications   sertraline (ZOLOFT) 25 MG tablet   Other Relevant Orders   Ambulatory referral to Psychiatry   Overweight    Wt Readings from Last 3 Encounters:  05/31/15 170 lb 4 oz (77.225 kg)  02/06/15 163 lb (73.936 kg)  12/21/14 165 lb 12.8 oz (75.206 kg)   Intentional 30 pound weight loss since May 2014 reviewed -  now BMI<30 but has plateau'ed (early 2015) Given co-dx HTN and OSA, ok for weight loss assistance med Co-tx with adderal prohibits stimulant or contrave 12/2013 trial belviq -ineffective, no change 02/2014 requested trial qysmia -ineffective, and mentally not doing well off Adderall - so resumed Adderall in place of Qysmia Request new prescription for Saxenda early 2016 - doing well The current medical regimen is effective;  continue present plan and medications.   Encouraged ongoing exercise and diet efforts Goal wt 142-150# reviewed       Other Visit Diagnoses    Need for prophylactic vaccination and inoculation against influenza        Relevant Orders    Flu Vaccine QUAD 36+ mos IM (Completed)    Neck muscle spasm            Rene Paci, MD

## 2015-05-31 NOTE — Patient Instructions (Signed)
It was good to see you today.  We have reviewed your prior records including labs and tests today  Your annual flu shot was given and/or updated today.  Medications reviewed and updated Start low dose sertraline daily and use flexeril muscle relaxer as needed - no other changes recommended at this time. Your prescription(s) have been submitted to your pharmacy. Please take as directed and contact our office if you believe you are having problem(s) with the medication(s).  we'll make referral to psychiatry as discussed. Our office will contact you regarding appointment(s) once made.  Please schedule followup in 3-4 months, call sooner if problems.

## 2015-05-31 NOTE — Progress Notes (Signed)
Pre visit review using our clinic review tool, if applicable. No additional management support is needed unless otherwise documented below in the visit note. 

## 2015-05-31 NOTE — Assessment & Plan Note (Signed)
Wt Readings from Last 3 Encounters:  05/31/15 170 lb 4 oz (77.225 kg)  02/06/15 163 lb (73.936 kg)  12/21/14 165 lb 12.8 oz (75.206 kg)   Intentional 30 pound weight loss since May 2014 reviewed -  now BMI<30 but has plateau'ed (early 2015) Given co-dx HTN and OSA, ok for weight loss assistance med Co-tx with adderal prohibits stimulant or contrave 12/2013 trial belviq -ineffective, no change 02/2014 requested trial qysmia -ineffective, and mentally not doing well off Adderall - so resumed Adderall in place of Qysmia Request new prescription for Saxenda early 2016 - doing well The current medical regimen is effective;  continue present plan and medications.   Encouraged ongoing exercise and diet efforts Goal wt 142-150# reviewed

## 2015-05-31 NOTE — Telephone Encounter (Signed)
Received faxes for both approvals.   Pt stated that he was able to get the Saxenda filled.   Pharmacy for the Adderall is stating that it will still not go through.

## 2015-05-31 NOTE — Telephone Encounter (Signed)
Pharmacy is still stating that the PA will not go through for them. Pt in today for an appt and I showed pt the faxes with the approval for the Adderall.

## 2015-06-01 ENCOUNTER — Other Ambulatory Visit: Payer: Self-pay | Admitting: Internal Medicine

## 2015-06-01 NOTE — Telephone Encounter (Signed)
No information regarding the PA at this time. Will check again.

## 2015-06-05 ENCOUNTER — Encounter: Payer: Self-pay | Admitting: Internal Medicine

## 2015-06-11 NOTE — Telephone Encounter (Signed)
This has been changed and pharmacy should be able to the run the claim for the correct medication.  Lvm with pharmacy and sent pt a message in my chart.

## 2015-06-11 NOTE — Telephone Encounter (Signed)
Called insurance to see if the PA for Adderall XR was received.   Rep stated that the PA for Adderall has been corrected and the pharmacy should be able to run the claim now.  Email sent to pt.

## 2015-06-12 ENCOUNTER — Other Ambulatory Visit: Payer: Self-pay | Admitting: Internal Medicine

## 2015-06-13 MED ORDER — SUMATRIPTAN SUCCINATE 50 MG PO TABS: 50.0000 mg | ORAL_TABLET | ORAL | Status: DC | PRN

## 2015-06-13 MED ORDER — ACYCLOVIR 5 % EX OINT: TOPICAL_OINTMENT | CUTANEOUS | Status: DC

## 2015-06-13 NOTE — Addendum Note (Signed)
Addended by: Deatra James on: 06/13/2015 09:15 AM   Modules accepted: Orders

## 2015-06-13 NOTE — Telephone Encounter (Signed)
Sent refill to [facility] 

## 2015-06-24 ENCOUNTER — Other Ambulatory Visit: Payer: Self-pay | Admitting: Internal Medicine

## 2015-06-26 ENCOUNTER — Other Ambulatory Visit: Payer: Self-pay | Admitting: Internal Medicine

## 2015-06-27 ENCOUNTER — Encounter: Payer: Self-pay | Admitting: Internal Medicine

## 2015-06-29 ENCOUNTER — Other Ambulatory Visit: Payer: Self-pay | Admitting: Internal Medicine

## 2015-07-02 ENCOUNTER — Other Ambulatory Visit: Payer: Self-pay | Admitting: Internal Medicine

## 2015-07-04 MED ORDER — AMPHETAMINE-DEXTROAMPHETAMINE 10 MG PO TABS: 10.0000 mg | ORAL_TABLET | Freq: Two times a day (BID) | ORAL | Status: DC | PRN

## 2015-07-04 MED ORDER — AMPHETAMINE-DEXTROAMPHET ER 20 MG PO CP24: 20.0000 mg | ORAL_CAPSULE | ORAL | Status: DC

## 2015-07-04 NOTE — Addendum Note (Signed)
Addended by: Rene PaciLESCHBER, VALERIE A on: 07/04/2015 08:42 AM   Modules accepted: Orders

## 2015-07-04 NOTE — Addendum Note (Signed)
Addended by: Rene PaciLESCHBER, Brae Schaafsma A on: 07/04/2015 08:41 AM   Modules accepted: Orders

## 2015-07-10 ENCOUNTER — Other Ambulatory Visit: Payer: Self-pay | Admitting: Internal Medicine

## 2015-07-20 ENCOUNTER — Other Ambulatory Visit: Payer: Self-pay | Admitting: Internal Medicine

## 2015-08-02 ENCOUNTER — Encounter: Payer: Self-pay | Admitting: Internal Medicine

## 2015-08-02 ENCOUNTER — Ambulatory Visit (INDEPENDENT_AMBULATORY_CARE_PROVIDER_SITE_OTHER): Payer: BC Managed Care – PPO | Admitting: Internal Medicine

## 2015-08-02 VITALS — BP 122/84 | HR 85 | Temp 97.9°F | Ht 66.5 in | Wt 171.4 lb

## 2015-08-02 DIAGNOSIS — F331 Major depressive disorder, recurrent, moderate: Secondary | ICD-10-CM

## 2015-08-02 DIAGNOSIS — F909 Attention-deficit hyperactivity disorder, unspecified type: Secondary | ICD-10-CM | POA: Diagnosis not present

## 2015-08-02 DIAGNOSIS — I10 Essential (primary) hypertension: Secondary | ICD-10-CM

## 2015-08-02 DIAGNOSIS — F988 Other specified behavioral and emotional disorders with onset usually occurring in childhood and adolescence: Secondary | ICD-10-CM

## 2015-08-02 MED ORDER — CYCLOBENZAPRINE HCL 5 MG PO TABS: 5.0000 mg | ORAL_TABLET | Freq: Three times a day (TID) | ORAL | Status: DC | PRN

## 2015-08-02 MED ORDER — AMPHETAMINE-DEXTROAMPHETAMINE 10 MG PO TABS: 10.0000 mg | ORAL_TABLET | Freq: Two times a day (BID) | ORAL | Status: DC | PRN

## 2015-08-02 MED ORDER — AMPHETAMINE-DEXTROAMPHET ER 20 MG PO CP24: 20.0000 mg | ORAL_CAPSULE | ORAL | Status: DC

## 2015-08-02 MED ORDER — SERTRALINE HCL 50 MG PO TABS: 50.0000 mg | ORAL_TABLET | Freq: Every day | ORAL | Status: DC

## 2015-08-02 NOTE — Progress Notes (Signed)
Subjective:    Patient ID: Tony [name], male    DOB: Oct 30, 1975, 39 y.o.   MRN: [id number]  HPI  Patient here for follow up, - reviewed interval events and current concerns  Past Medical History  Diagnosis Date  . Asthma   . Anxiety     overlap with depression and ADD  . Hypertension   . Hepatitis A   . Migraines   . OSA on CPAP     Sleep study 12/2011: severe, CPAP  . Allergic rhinitis   . Migraine     Review of Systems  Constitutional: Positive for fatigue. Negative for unexpected weight change.  Respiratory: Negative for cough and shortness of breath.   Cardiovascular: Negative for chest pain and leg swelling.  Psychiatric/Behavioral: Positive for dysphoric mood and decreased concentration. Negative for suicidal ideas and self-injury. The patient is nervous/anxious.        Objective:    Physical Exam  Constitutional: He is oriented to person, place, and time. He appears well-developed and well-nourished. No distress.  Cardiovascular: Normal rate, regular rhythm and normal heart sounds.   No murmur heard. Pulmonary/Chest: Effort normal and breath sounds normal. No respiratory distress.  Neurological: He is alert and oriented to person, place, and time.  Psychiatric: Judgment and thought content normal.  Dysphoric, flat affect    BP 122/84 mmHg  Pulse 85  Temp(Src) 97.9 F (36.6 C) (Oral)  Ht 5' 6.5" (1.689 m)  Wt 171 lb 6 oz (77.735 kg)  BMI 27.25 kg/m2  SpO2 99% Wt Readings from Last 3 Encounters:  08/02/15 171 lb 6 oz (77.735 kg)  05/31/15 170 lb 4 oz (77.225 kg)  02/06/15 163 lb (73.936 kg)    Lab Results  Component Value Date   WBC 6.2 02/06/2015   HGB 15.8 02/06/2015   HCT 46.5 02/06/2015   PLT 331.0 02/06/2015   GLUCOSE 95 02/06/2015   CHOL 173 09/04/2014   TRIG 56.0 09/04/2014   HDL 49.90 09/04/2014   LDLDIRECT 144.5 12/16/2012   LDLCALC 112* 09/04/2014   ALT 36 02/06/2015   AST 23 02/06/2015   NA 137 02/06/2015   K 3.9  02/06/2015   CL 99 02/06/2015   CREATININE 1.01 02/06/2015   BUN 19 02/06/2015   CO2 33* 02/06/2015   TSH 0.88 02/06/2015    Dg Chest 2 View  03/10/2014  CLINICAL DATA:  Cough.  Short of breath. EXAM: CHEST  2 VIEW COMPARISON:  05/27/2013 FINDINGS: The heart size and mediastinal contours are within normal limits. Both lungs are clear. The visualized skeletal structures are unremarkable. IMPRESSION: No active cardiopulmonary disease. Electronically Signed   By: Amie Portland M.D.   On: 03/10/2014 11:36       Assessment & Plan:   Problem List Items Addressed This Visit    ADD (attention deficit disorder)    Increased Adderall xr to  qd  01/2013, then  qd fall 2015 due to demands of school - overall doing well Will use short acting as needed in addition to long-acting for periods of stress/testing New prescription for short acting as well as long-acting refill today - rx done      Essential hypertension    BP Readings from Last 3 Encounters:  08/02/15 122/84  05/31/15 122/78  02/06/15 120/74   Added diuretic to ongoing ACE inhibitor 11/2012 Improving with weight loss, but not resolved Reduced dose summer 2014 but reports not controlled at home so will resume full tab qd summer  2015 The current medical regimen is effective;  continue present plan and medications.       Major depressive disorder, recurrent episode, moderate (HCC) - Primary    Long history of same, depression with overlapping anxiety and ADD, previously under psychiatry care until 2010 Reports previous trials of Paxil ineffective Also previously tried on Cymbalta, Abilify, Lamictal mood stabilizer in past - but reports stopping all in 2010 because "doses were too high" increasing depression with anxiety symptoms reviewed, manifest with excessive fatigue, worry, distractibility and irritability repeat discussion regarding medication options and behavioral health therapies for treatment of same Tried resumed  Cymbalta 30 mg qd, low-dose in 2014 but self DC'd due to sweats began low dose sertraline 05/31/15, titrate up now and refer to psyc care in [place] area continue Adderall XR alprazolam dose (0.5 mg twice a day when necessary) s/p single evaluation by Dr Noe GensPeters for behv health counseling 12/2012 - no plans for follow up  Verified no SI/HI, pt will call if problems unimproved with med changes or if symptoms worse       Relevant Medications   sertraline (ZOLOFT) 50 MG tablet       Rene PaciValerie Leschber, MD

## 2015-08-02 NOTE — Assessment & Plan Note (Signed)
Long history of same, depression with overlapping anxiety and ADD, previously under psychiatry care until 2010 Reports previous trials of Paxil ineffective Also previously tried on Cymbalta, Abilify, Lamictal mood stabilizer in past - but reports stopping all in 2010 because "doses were too high" increasing depression with anxiety symptoms reviewed, manifest with excessive fatigue, worry, distractibility and irritability repeat discussion regarding medication options and behavioral health therapies for treatment of same Tried resumed Cymbalta 30 mg qd, low-dose in 2014 but self DC'd due to sweats began low dose sertraline 05/31/15, titrate up now and refer to psyc care in [place] area continue Adderall XR alprazolam dose (0.5 mg twice a day when necessary) s/p single evaluation by Dr Noe GensPeters for behv health counseling 12/2012 - no plans for follow up  Verified no SI/HI, pt will call if problems unimproved with med changes or if symptoms worse

## 2015-08-02 NOTE — Assessment & Plan Note (Signed)
BP Readings from Last 3 Encounters:  08/02/15 122/84  05/31/15 122/78  02/06/15 120/74   Added diuretic to ongoing ACE inhibitor 11/2012 Improving with weight loss, but not resolved Reduced dose summer 2014 but reports not controlled at home so will resume full tab qd summer 2015 The current medical regimen is effective;  continue present plan and medications.

## 2015-08-02 NOTE — Assessment & Plan Note (Signed)
Increased Adderall xr to 15mg  qd  01/2013, then 20mg  qd fall 2015 due to demands of school - overall doing well Will use short acting as needed in addition to long-acting for periods of stress/testing New prescription for short acting as well as long-acting refill today - rx done

## 2015-08-02 NOTE — Patient Instructions (Signed)
It was good to see you today.  We have reviewed your prior records including labs and tests today  Your annual flu shot was given and/or updated today.  Medications reviewed and updated increase dose sertraline to 50mg  daily and continue to use flexeril muscle relaxer as needed - no other changes recommended at this time. Your prescription(s) have been submitted to your pharmacy. Please take as directed and contact our office if you believe you are having problem(s) with the medication(s).  Please follow-up with psychiatry as discussed. Let my office know if referral is needed  Please schedule followup in 3 months w/ Dr. Lawerance BachBurns, call sooner if problems.

## 2015-08-02 NOTE — Progress Notes (Signed)
Pre visit review using our clinic review tool, if applicable. No additional management support is needed unless otherwise documented below in the visit note. 

## 2015-08-10 ENCOUNTER — Other Ambulatory Visit: Payer: Self-pay | Admitting: Internal Medicine

## 2015-08-11 ENCOUNTER — Other Ambulatory Visit: Payer: Self-pay | Admitting: Internal Medicine

## 2015-09-03 ENCOUNTER — Other Ambulatory Visit: Payer: Self-pay | Admitting: Internal Medicine

## 2015-09-05 ENCOUNTER — Encounter: Payer: Self-pay | Admitting: Internal Medicine

## 2015-09-08 ENCOUNTER — Other Ambulatory Visit: Payer: Self-pay | Admitting: Internal Medicine

## 2015-09-12 ENCOUNTER — Telehealth: Payer: Self-pay

## 2015-09-12 NOTE — Telephone Encounter (Signed)
PA initiated via covermymeds. Key for PA is [id number]

## 2015-09-23 ENCOUNTER — Other Ambulatory Visit: Payer: Self-pay | Admitting: Internal Medicine

## 2015-09-27 NOTE — Telephone Encounter (Signed)
PA was denied due to insufficient information provided to determine  Coverage.

## 2015-09-28 ENCOUNTER — Telehealth: Payer: Self-pay

## 2015-09-28 MED ORDER — ALPRAZOLAM 0.5 MG PO TABS: ORAL_TABLET | ORAL | Status: DC

## 2015-09-28 NOTE — Telephone Encounter (Signed)
RX faxed to [facility] on [address]

## 2015-09-28 NOTE — Telephone Encounter (Signed)
printed

## 2015-09-28 NOTE — Telephone Encounter (Signed)
rf rq for alprazolam 0.5 mg.   OV to xfer on 10/2015

## 2015-10-10 ENCOUNTER — Encounter: Payer: Self-pay | Admitting: Internal Medicine

## 2015-10-12 ENCOUNTER — Telehealth: Payer: Self-pay | Admitting: Internal Medicine

## 2015-10-12 NOTE — Telephone Encounter (Signed)
Pt would like you to call him regarding an email he sent through mychart.  He wanted to send this to you

## 2015-10-12 NOTE — Telephone Encounter (Signed)
PA was not required yet. Pt had rx filled on 09/25/15 without any issue.

## 2015-10-19 ENCOUNTER — Other Ambulatory Visit: Payer: Self-pay | Admitting: Internal Medicine

## 2015-10-19 NOTE — Telephone Encounter (Signed)
Please advise if refill is appropriate 

## 2015-10-20 NOTE — Telephone Encounter (Signed)
Refill sent.

## 2015-10-24 ENCOUNTER — Encounter: Payer: Self-pay | Admitting: Internal Medicine

## 2015-10-25 ENCOUNTER — Telehealth: Payer: Self-pay

## 2015-10-25 NOTE — Telephone Encounter (Signed)
PA initiated via [facility]Meds [facility]

## 2015-10-25 NOTE — Telephone Encounter (Signed)
PA approved 09/25/2015 - 10/24/2016, pt advised of same

## 2015-10-30 ENCOUNTER — Ambulatory Visit: Payer: BC Managed Care – PPO | Admitting: Internal Medicine

## 2015-10-30 ENCOUNTER — Telehealth: Payer: Self-pay | Admitting: Internal Medicine

## 2015-10-30 MED ORDER — SERTRALINE HCL 50 MG PO TABS: 50.0000 mg | ORAL_TABLET | Freq: Every day | ORAL | Status: DC

## 2015-10-30 MED ORDER — AMPHETAMINE-DEXTROAMPHET ER 20 MG PO CP24: 20.0000 mg | ORAL_CAPSULE | ORAL | Status: DC

## 2015-10-30 MED ORDER — AMPHETAMINE-DEXTROAMPHETAMINE 10 MG PO TABS: 10.0000 mg | ORAL_TABLET | Freq: Two times a day (BID) | ORAL | Status: DC | PRN

## 2015-10-30 MED ORDER — CYCLOBENZAPRINE HCL 5 MG PO TABS: 5.0000 mg | ORAL_TABLET | Freq: Three times a day (TID) | ORAL | Status: DC | PRN

## 2015-10-30 NOTE — Telephone Encounter (Signed)
Patient has an appointment on 12/18/2015. Please fill the following scripts until that appt.   amphetamine-dextroamphetamine (ADDERALL XR) 20 MG 24 hr capsule [[id number]]  amphetamine-dextroamphetamine (ADDERALL) 10 MG tablet [[id number]]  cyclobenzaprine (FLEXERIL) 5 MG tablet [[id number]]  sertraline (ZOLOFT) 50 MG tablet [[id number]]

## 2015-10-30 NOTE — Telephone Encounter (Signed)
Ok to fill 

## 2015-10-30 NOTE — Telephone Encounter (Signed)
Please advise. Pt had appt today at 1030 and thought it was at 1045.

## 2015-10-30 NOTE — Telephone Encounter (Signed)
RXs have been sent and adderall has been printed for signature.

## 2015-10-31 NOTE — Telephone Encounter (Signed)
LVM informing pt rxs were ready for pick-up

## 2015-11-12 ENCOUNTER — Other Ambulatory Visit: Payer: Self-pay | Admitting: Internal Medicine

## 2015-12-18 ENCOUNTER — Ambulatory Visit (INDEPENDENT_AMBULATORY_CARE_PROVIDER_SITE_OTHER): Payer: BC Managed Care – PPO | Admitting: Internal Medicine

## 2015-12-18 ENCOUNTER — Encounter: Payer: Self-pay | Admitting: Internal Medicine

## 2015-12-18 ENCOUNTER — Other Ambulatory Visit (INDEPENDENT_AMBULATORY_CARE_PROVIDER_SITE_OTHER): Payer: BC Managed Care – PPO

## 2015-12-18 VITALS — BP 114/86 | HR 87 | Temp 98.3°F | Resp 16 | Wt 177.0 lb

## 2015-12-18 DIAGNOSIS — I10 Essential (primary) hypertension: Secondary | ICD-10-CM

## 2015-12-18 DIAGNOSIS — E663 Overweight: Secondary | ICD-10-CM

## 2015-12-18 DIAGNOSIS — Z139 Encounter for screening, unspecified: Secondary | ICD-10-CM

## 2015-12-18 DIAGNOSIS — F411 Generalized anxiety disorder: Secondary | ICD-10-CM

## 2015-12-18 DIAGNOSIS — M546 Pain in thoracic spine: Secondary | ICD-10-CM

## 2015-12-18 DIAGNOSIS — M549 Dorsalgia, unspecified: Secondary | ICD-10-CM

## 2015-12-18 DIAGNOSIS — F331 Major depressive disorder, recurrent, moderate: Secondary | ICD-10-CM

## 2015-12-18 DIAGNOSIS — F988 Other specified behavioral and emotional disorders with onset usually occurring in childhood and adolescence: Secondary | ICD-10-CM

## 2015-12-18 DIAGNOSIS — G43909 Migraine, unspecified, not intractable, without status migrainosus: Secondary | ICD-10-CM

## 2015-12-18 DIAGNOSIS — J4520 Mild intermittent asthma, uncomplicated: Secondary | ICD-10-CM

## 2015-12-18 DIAGNOSIS — F909 Attention-deficit hyperactivity disorder, unspecified type: Secondary | ICD-10-CM

## 2015-12-18 DIAGNOSIS — G8929 Other chronic pain: Secondary | ICD-10-CM | POA: Insufficient documentation

## 2015-12-18 LAB — COMPREHENSIVE METABOLIC PANEL
ALK PHOS: 90 U/L (ref 39–117)
ALT: 44 U/L (ref 0–53)
AST: 20 U/L (ref 0–37)
Albumin: 5 g/dL (ref 3.5–5.2)
BILIRUBIN TOTAL: 0.3 mg/dL (ref 0.2–1.2)
BUN: 15 mg/dL (ref 6–23)
CALCIUM: 9.7 mg/dL (ref 8.4–10.5)
CO2: 31 mEq/L (ref 19–32)
Chloride: 99 mEq/L (ref 96–112)
Creatinine, Ser: 1.14 mg/dL (ref 0.40–1.50)
GFR: 75.41 mL/min (ref >60.00)
GLUCOSE: 88 mg/dL (ref 70–99)
POTASSIUM: 3.9 meq/L (ref 3.5–5.1)
Sodium: 137 mEq/L (ref 135–145)
TOTAL PROTEIN: 7.6 g/dL (ref 6.0–8.3)

## 2015-12-18 MED ORDER — AMPHETAMINE-DEXTROAMPHETAMINE 10 MG PO TABS
10.0000 mg | ORAL_TABLET | Freq: Two times a day (BID) | ORAL | Status: DC | PRN
Start: 2015-12-18 — End: 2016-04-02

## 2015-12-18 MED ORDER — INDOMETHACIN 50 MG PO CAPS: 50.0000 mg | ORAL_CAPSULE | Freq: Three times a day (TID) | ORAL | Status: DC | PRN

## 2015-12-18 MED ORDER — AMPHETAMINE-DEXTROAMPHET ER 20 MG PO CP24: 20.0000 mg | ORAL_CAPSULE | ORAL | Status: DC

## 2015-12-18 NOTE — Assessment & Plan Note (Signed)
BP well controlled Current regimen effective and well tolerated Continue current medications at current doses  

## 2015-12-18 NOTE — Progress Notes (Signed)
Subjective:    Patient ID: Tony [name], male    DOB: Feb 01, 1976, 40 y.o.   MRN: [id number]  HPI He is here to establish with a new pcp.  He is here for follow up. 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  Hypertension: He is taking his medication daily. He is compliant with a low sodium diet.  He denies chest pain, palpitations, edema, shortness of breath and regular headaches. He is exercising regularly.  He does not monitor his blood pressure at home.    Migraine headaches:  He takes imitrex as needed, but does not have migraines often.   Depression, anxiety:  He struggles more with anxiety than depression.  He is taking his medication daily as prescribed. He denies any side effects from the medication. He feels his depression is well controlled,but struggles with anxiety.  He is happy with his current dose of sertraline.  He takes xanax 1-2 times a week.     Chest pain, muscular:  He takes indomethacin 1-2 times a week for muscular chest pain.    ADD: He started struggling with ADD in graduate school.  He was diagnosed by a psychiatrist.  With daily work he does not need the adderall, but with school he needs it.  He takes the extended release daily and the immediate release as needed.  He denies side effects from the medications.   Overweight:  He is taking the saxenda and denies any side effects.  He feels it is maintaining his weight. He is trying to exercise daily.    B/l upper back pain:  He has had right > left upper back pain for about one yar.  It wakes him up at night.  The pain can be severe at times.  He has tried tyelnol and advil, which did not help.  He treied naprosyn and that did not help.  He was also been prescribed flexeril 7.5mg  - 10 mg and this helps with the naprosyn.  He does tense up his shoulders when he is anxious. He does not work at the computer all day.   Medications and allergies reviewed with patient and updated if appropriate.  Patient  Active Problem List   Diagnosis Date Noted  . Major depressive disorder, recurrent episode, moderate (HCC) 05/31/2015  . Overweight   . Asthma, mild intermittent 03/31/2013  . Migraine   . Obstructive sleep apnea   . Generalized anxiety disorder   . Seasonal and perennial allergic rhinitis   . Essential hypertension   . ADD (attention deficit disorder)     Current Outpatient Prescriptions on File Prior to Visit  Medication Sig Dispense Refill  . acyclovir ointment (ZOVIRAX) 5 % APPLY TOPICALLY EVERY 3 HOURS 15 g 0  . ALPRAZolam (XANAX) 0.5 MG tablet TAKE 1 TABLET BY MOUTH TWICE DAILY AS NEEDED FOR ANXIETY OR SLEEP 60 tablet 0  . amphetamine-dextroamphetamine (ADDERALL XR) 20 MG 24 hr capsule Take 1 capsule (20 mg total) by mouth every morning. 30 capsule 0  . amphetamine-dextroamphetamine (ADDERALL) 10 MG tablet Take 1 tablet (10 mg total) by mouth 2 (two) times daily as needed. 60 tablet 0  . azelastine (ASTELIN) 137 MCG/SPRAY nasal spray Place 2 sprays into the nose 2 (two) times daily. Use in each nostril as directed 30 mL 2  . cetirizine (ZYRTEC) 10 MG tablet Take 1 tablet (10 mg total) by mouth daily. 90 tablet 1  . cyclobenzaprine (FLEXERIL) 5 MG tablet Take 1  tablet (5 mg total) by mouth 3 (three) times daily as needed for muscle spasms. 60 tablet 0  . fluticasone (FLONASE) 50 MCG/ACT nasal spray USE TWO SPRAY IN NOSE ONCE DAILY 16 g 5  . indomethacin (INDOCIN) 50 MG capsule Take 1 capsule (50 mg total) by mouth 3 (three) times daily as needed. Must keep march appt for future refills 90 capsule 0  . Insulin Pen Needle (NOVOFINE) 32G X 6 MM MISC Needles to be used with injecting rx for saxenda 100 each 1  . levalbuterol (XOPENEX HFA) 45 MCG/ACT inhaler Inhale 2 puffs into the lungs every 6 (six) hours as needed for wheezing. 3 Inhaler 3  . Liraglutide -Weight Management (SAXENDA) 18 MG/3ML SOPN Inject 3 mg into the skin daily. 15 mL 5  . losartan-hydrochlorothiazide (HYZAAR) 50-12.5  MG per tablet Take 1 tablet by mouth daily. 90 tablet 3  . sertraline (ZOLOFT) 50 MG tablet Take 1 tablet (50 mg total) by mouth daily. 90 tablet 0  . SUMAtriptan (IMITREX) 50 MG tablet TAKE 1 TABLET BY MOUTH EVERY 2 HOURS AS NEEDED FOR MIGRAINE *MAY REPEAT IN 2 HOURS IF HEADACHE PERSISTS OR RECURS* MAX 2 TABS/24 HOURS 12 tablet 3   No current facility-administered medications on file prior to visit.    Past Medical History  Diagnosis Date  . Asthma   . Anxiety     overlap with depression and ADD  . Hypertension   . Hepatitis A   . Migraines   . OSA on CPAP     Sleep study 12/2011: severe, CPAP  . Allergic rhinitis   . Migraine     Past Surgical History  Procedure Laterality Date  . Lasik  2005  . Wisdom tooth extraction      Social History   Social History  . Marital Status: Single    Spouse Name: N/A  . Number of Children: N/A  . Years of Education: N/A   Occupational History  . 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 Smoking status: Former Smoker -- 0.24 packs/day for 3 years    Types: Cigarettes    Quit date: 09/22/2008  . Smokeless tobacco: Not on file  . Alcohol Use: 0.0 oz/week    0 Standard drinks or equivalent per week     Comment: OCCASSIONAL  . Drug Use: No  . Sexual Activity: Not on file   Other Topics Concern  . 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 Hypertension Mother   . Arthritis Father   . Hypertension Father   . Diabetes Other   . Emphysema Paternal Grandfather   . Asthma Paternal Grandfather   . Lung cancer Maternal Grandfather     Review of Systems  Constitutional: Negative for fever, chills, appetite change and unexpected weight change.  Respiratory: Negative for cough, shortness of breath and wheezing.   Cardiovascular: Negative for chest pain, palpitations  and leg swelling.  Neurological: Negative for dizziness, light-headedness and headaches.  Psychiatric/Behavioral: Positive for dysphoric mood. The patient is nervous/anxious.        Objective:   Filed Vitals:   12/18/15 0905  BP: 114/86  Pulse: 87  Temp: 98.3 F (36.8 C)  Resp: 16   Filed Weights   12/18/15 0905  Weight: 177 lb (80.287 kg)  Body mass index is 28.14 kg/(m^2).   Physical Exam Constitutional: Appears well-developed and well-nourished. No distress.  Neck: Neck supple. No tracheal deviation present. No thyromegaly present.  No carotid bruit. No cervical adenopathy.   Cardiovascular: Normal rate, regular rhythm and normal heart sounds.   No murmur heard.  No edema Pulmonary/Chest: Effort normal and breath sounds normal. No respiratory distress. No wheezes.  Psych: normal mood and affect      Assessment & Plan:   See Problem List for Assessment and Plan of chronic medical problems.   Follow up in 6 months

## 2015-12-18 NOTE — Assessment & Plan Note (Signed)
Uses inhaler infrequently

## 2015-12-18 NOTE — Assessment & Plan Note (Signed)
B/l trapezius tightness > 1 year Has tried heat, tylenol, nsaids and flexeril Flexeril helps with nsaids Will try massage Deferred PT referral today, but will consider Revise activities Work on decreasing stress - exercise, etc

## 2015-12-18 NOTE — Patient Instructions (Signed)

## 2015-12-18 NOTE — Assessment & Plan Note (Signed)
Has seen psychiatry in the past Taking sertraline  - depression is controlled

## 2015-12-18 NOTE — Assessment & Plan Note (Signed)
Stable, controlled, no side effects Uses short acting dose as needed Continue current dose Refills given today Follow up in 6 months

## 2015-12-18 NOTE — Assessment & Plan Note (Addendum)
Taking saxenda -  No side effects Working on regular exercise Monitor weight - if increases at next visit - consider discontinuing medication  Wt Readings from Last 3 Encounters:  12/18/15 177 lb (80.287 kg)  08/02/15 171 lb 6 oz (77.735 kg)  05/31/15 170 lb 4 oz (77.225 kg)

## 2015-12-18 NOTE — Progress Notes (Signed)
Pre visit review using our clinic review tool, if applicable. No additional management support is needed unless otherwise documented below in the visit note. 

## 2015-12-18 NOTE — Assessment & Plan Note (Signed)
Infrequent migraines Takes imitrex as needed Continue same

## 2015-12-18 NOTE — Assessment & Plan Note (Signed)
Taking sertaline daily, uses xanax as needed, 1-2 times a week - advised to avoid using it more than that Stressed regular exercise - he admits that does help with his anxiety Continue current meds

## 2015-12-19 ENCOUNTER — Telehealth: Payer: Self-pay | Admitting: Emergency Medicine

## 2015-12-19 LAB — HIV ANTIBODY (ROUTINE TESTING W REFLEX): HIV: NONREACTIVE

## 2015-12-19 MED ORDER — CYCLOBENZAPRINE HCL 5 MG PO TABS: 5.0000 mg | ORAL_TABLET | Freq: Three times a day (TID) | ORAL | Status: DC | PRN

## 2015-12-19 NOTE — Telephone Encounter (Signed)
Okay to refill? 

## 2015-12-20 ENCOUNTER — Encounter: Payer: Self-pay | Admitting: Internal Medicine

## 2015-12-20 NOTE — Telephone Encounter (Signed)
Please advise 

## 2016-01-15 ENCOUNTER — Other Ambulatory Visit: Payer: Self-pay | Admitting: Internal Medicine

## 2016-01-25 ENCOUNTER — Encounter: Payer: Self-pay | Admitting: Family Medicine

## 2016-01-25 ENCOUNTER — Ambulatory Visit (INDEPENDENT_AMBULATORY_CARE_PROVIDER_SITE_OTHER)
Admission: RE | Admit: 2016-01-25 | Discharge: 2016-01-25 | Disposition: A | Discharge status: Home / Self Care | Payer: BC Managed Care – PPO | Source: Ambulatory Visit | Attending: Family Medicine | Admitting: Family Medicine

## 2016-01-25 ENCOUNTER — Other Ambulatory Visit: Payer: Self-pay

## 2016-01-25 ENCOUNTER — Ambulatory Visit: Payer: BC Managed Care – PPO | Admitting: Family

## 2016-01-25 ENCOUNTER — Ambulatory Visit (INDEPENDENT_AMBULATORY_CARE_PROVIDER_SITE_OTHER): Payer: BC Managed Care – PPO | Admitting: Family Medicine

## 2016-01-25 VITALS — BP 118/82 | HR 82 | Ht 66.5 in | Wt 178.0 lb

## 2016-01-25 DIAGNOSIS — M25511 Pain in right shoulder: Secondary | ICD-10-CM

## 2016-01-25 DIAGNOSIS — M7551 Bursitis of right shoulder: Secondary | ICD-10-CM | POA: Diagnosis not present

## 2016-01-25 MED ORDER — DICLOFENAC SODIUM 2 % TD SOLN: 2.0000 "application " | Freq: Two times a day (BID) | TRANSDERMAL | Status: DC

## 2016-01-25 NOTE — Progress Notes (Signed)
[facility] ScaleZach Owyn Raulston D.O.  Sports Medicine [address] [place], [place] [address] Phone: [phone] Subjective:    I'm seeing this patient by the request  of:  Pincus SanesStacy J Burns, MD   CC: right shoulder pain  BJY:[id number]:Subjective Orland MustardStacy J Pirozzi is a 40 y.o. male coming in with complaint of right shoulder pain. Patient describes pain as a dull, throbbing aching sensation. Patient states that his been going on for approximately a year. Did have a fall. States now that he is losing some range of motion and feels that may be even some weakness. Does wake him up at night. Some mild radiation down the right arm. Rates the severity of pain a 7 out of 10. Patient has tried some over-the-counter or anti-inflammatories occasionally with mild improvement. Any overhead activity seems to make it worse. Laying on the shoulder at night can make it worse.     Past Medical History  Diagnosis Date  . Asthma   . Anxiety     overlap with depression and ADD  . Hypertension   . Hepatitis A   . Migraines   . OSA on CPAP     Sleep study 12/2011: severe, CPAP 10mmHg  . Allergic rhinitis   . Migraine    Past Surgical History  Procedure Laterality Date  . Lasik  2005  . Wisdom tooth extraction     Social History   Social History  . Marital Status: Single    Spouse Name: N/A  . Number of Children: N/A  . Years of Education: N/A   Occupational History  . 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 Smoking status: Former Smoker -- 0.24 packs/day for 3 years    Types: Cigarettes    Quit date: 09/22/2008  . Smokeless tobacco: None  . Alcohol Use: 0.0 oz/week    0 Standard drinks or equivalent per week     Comment: OCCASSIONAL  . Drug Use: No  . Sexual Activity: Not Asked   Other Topics Concern  . 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 Erythromycin     Pt states  med make him sick   Family History  Problem Relation Age of Onset  . Hypertension Mother   . Arthritis Father   . Hypertension Father   . Diabetes Other   . Emphysema Paternal Grandfather   . Asthma Paternal Grandfather   . Lung cancer Maternal Grandfather     Past medical history, social, surgical and family history all reviewed in electronic medical record.  No pertanent information unless stated regarding to the chief complaint.   Review of Systems: No headache, visual changes, nausea, vomiting, diarrhea, constipation, dizziness, abdominal pain, skin rash, fevers, chills, night sweats, weight loss, swollen lymph nodes, body aches, joint swelling, muscle aches, chest pain, shortness of breath, mood changes.   Objective Blood pressure 118/82, pulse 82, height 5' 6.5" (1.689 m), weight 178 lb (80.74 kg), SpO2 99 %.  General: No apparent distress alert and oriented x3 mood and affect normal, dressed appropriately.  HEENT: Pupils equal, extraocular movements intact  Respiratory: Patient's speak in full sentences and does not appear short of breath  Cardiovascular: No lower extremity edema, non tender, no erythema  Skin: Warm dry intact with no signs of infection or rash on extremities or on axial skeleton.  Abdomen: Soft nontender  Neuro: Cranial nerves II through XII are intact, neurovascularly intact in all extremities with 2+ DTRs and 2+ pulses.  Lymph: No lymphadenopathy of posterior or anterior cervical chain or axillae bilaterally.  Gait normal with good balance and coordination.  MSK:  Non tender with full range of motion and good stability and symmetric strength and tone of , elbows, wrist, hip, knee and ankles bilaterally.  Shoulder: Right Inspection reveals no abnormalities, atrophy or asymmetry. Palpation is normal with no tenderness over AC joint or bicipital groove. ROM is full in all planes passively. Rotator cuff strength normal throughout. signs of impingement with  positive Neer and Hawkin's tests, but negative empty can sign. Speeds and Yergason's tests normal. No labral pathology noted with negative Obrien's, negative clunk and good stability. Normal scapular function observed. No painful arc and no drop arm sign. No apprehension sign  MSK US performed of: Right This study was ordered, performed, and interpreted by Terrilee Files D.O.  Shoulder:   Supraspinatus:  Appears normal on long and transverse views, Bursal bulge seen with shoulder abduction on impingement view. Infraspinatus:  Appears normal on long and transverse views. Significant increase in Doppler flow Subscapularis:  Appears normal on long and transverse views. Positive bursa Teres Minor:  Appears normal on long and transverse views. AC joint:  Capsule undistended, no geyser sign. Glenohumeral Joint:  Appears normal without effusion. Glenoid Labrum:  Intact without visualized tears. Biceps Tendon:  Appears normal on long and transverse views, no fraying of tendon, tendon located in intertubercular groove, no subluxation with shoulder internal or external rotation.  Impression: Subacromial bursitis  Procedure: Real-time Ultrasound Guided Injection of right glenohumeral joint Device: GE Logiq E  Ultrasound guided injection is preferred based studies that show increased duration, increased effect, greater accuracy, decreased procedural pain, increased response rate with ultrasound guided versus blind injection.  Verbal informed consent obtained.  Time-out conducted.  Noted no overlying erythema, induration, or other signs of local infection.  Skin prepped in a sterile fashion.  Local anesthesia: Topical Ethyl chloride.  With sterile technique and under real time ultrasound guidance:  Joint visualized.  23g 1  inch needle inserted posterior approach. Pictures taken for needle placement. Patient did have injection of 2 cc of 1% lidocaine, 2 cc of 0.5% Marcaine, and 1.0 cc of Kenalog 40  mg/dL. Completed without difficulty  Pain immediately resolved suggesting accurate placement of the medication.  Advised to call if fevers/chills, erythema, induration, drainage, or persistent bleeding.  Images permanently stored and available for review in the ultrasound unit.  Impression: Technically successful ultrasound guided injection.   Procedure note 97110; 15 minutes spent for Therapeutic exercises as stated in above notes.  This included exercises focusing on stretching, strengthening, with significant focus on eccentric aspects. Shoulder Exercises that included:  Basic scapular stabilization to include adduction and depression of scapula Scaption, focusing on proper movement and good control Internal and External rotation utilizing a theraband, with elbow tucked at side entire time Rows with theraband    Proper technique shown and discussed handout in great detail with ATC.  All questions were discussed and answered.      Impression and Recommendations:     This case required medical decision making of moderate complexity.      Note: This dictation was prepared with Dragon dictation along with smaller phrase technology. Any transcriptional errors that result from this process are unintentional.

## 2016-01-25 NOTE — Patient Instructions (Addendum)
Good to see you  We will get xray downstairs today  Ice 20 minutes 2 times daily. Usually after activity and before bed. Exercises 3 times a week.  pennsaid pinkie amount topically 2 times daily as needed.  See me again in 3-4 weeks if you are still having pain .

## 2016-01-25 NOTE — Progress Notes (Signed)
Pre visit review using our clinic review tool, if applicable. No additional management support is needed unless otherwise documented below in the visit note. 

## 2016-01-25 NOTE — Assessment & Plan Note (Signed)
Patient given injection today and tolerated the procedure well. We discussed icing regimen and home exercises. We discussed which activities to do in which ones to avoid. X-ray the shoulder ordered today as well for further evaluation for any other bony abnormalities. Patient does have a family history of multiple shoulder replacements and nails and his family. Patient given prescription for topical anti-inflammatories. Follow-up with me again in 3-4 weeks for further evaluation and treatment. Worsening symptoms formal physical therapy could be necessary.

## 2016-01-30 ENCOUNTER — Ambulatory Visit: Payer: BC Managed Care – PPO | Admitting: Family

## 2016-01-31 ENCOUNTER — Other Ambulatory Visit: Payer: Self-pay | Admitting: Emergency Medicine

## 2016-01-31 MED ORDER — ALPRAZOLAM 0.5 MG PO TABS: ORAL_TABLET | ORAL | Status: DC

## 2016-01-31 MED ORDER — CYCLOBENZAPRINE HCL 5 MG PO TABS: 5.0000 mg | ORAL_TABLET | Freq: Three times a day (TID) | ORAL | Status: DC | PRN

## 2016-02-05 ENCOUNTER — Other Ambulatory Visit: Payer: Self-pay | Admitting: Internal Medicine

## 2016-02-05 ENCOUNTER — Ambulatory Visit: Payer: Self-pay | Admitting: Internal Medicine

## 2016-02-05 MED ORDER — AMPHETAMINE-DEXTROAMPHET ER 20 MG PO CP24: 20.0000 mg | ORAL_CAPSULE | ORAL | Status: DC

## 2016-02-05 NOTE — Addendum Note (Signed)
Addended by: Corwin LevinsJOHN, [name] on: 02/05/2016 03:30 PM   Modules accepted: Orders

## 2016-02-05 NOTE — Telephone Encounter (Signed)
Ok to defer to PCP, thanks

## 2016-02-06 ENCOUNTER — Telehealth: Payer: Self-pay | Admitting: Emergency Medicine

## 2016-02-06 NOTE — Telephone Encounter (Signed)
LVM informing pt RX for Adderall was ready for pick-up at front office.

## 2016-02-07 ENCOUNTER — Encounter: Payer: BLUE CROSS/BLUE SHIELD | Admitting: Internal Medicine

## 2016-02-19 ENCOUNTER — Ambulatory Visit: Payer: BC Managed Care – PPO | Admitting: Family Medicine

## 2016-02-21 ENCOUNTER — Ambulatory Visit (INDEPENDENT_AMBULATORY_CARE_PROVIDER_SITE_OTHER): Payer: BC Managed Care – PPO | Admitting: Family Medicine

## 2016-02-21 ENCOUNTER — Other Ambulatory Visit: Payer: Self-pay

## 2016-02-21 ENCOUNTER — Encounter: Payer: Self-pay | Admitting: Family Medicine

## 2016-02-21 VITALS — BP 118/82 | HR 78 | Wt 175.0 lb

## 2016-02-21 DIAGNOSIS — G8929 Other chronic pain: Secondary | ICD-10-CM | POA: Diagnosis not present

## 2016-02-21 DIAGNOSIS — M25511 Pain in right shoulder: Secondary | ICD-10-CM | POA: Insufficient documentation

## 2016-02-21 DIAGNOSIS — M546 Pain in thoracic spine: Secondary | ICD-10-CM

## 2016-02-21 DIAGNOSIS — M549 Dorsalgia, unspecified: Secondary | ICD-10-CM

## 2016-02-21 NOTE — Progress Notes (Signed)
[name] [address] [place], [place] [address] Phone: [phone] Subjective:    I'm seeing this patient by the request  of:  Pincus Sanes, MD   CC: right shoulder pain Follow-up  [id number] JAMELL LAYMON is a 40 y.o. male coming in with complaint of right shoulder pain. Found to have more of a subacromial bursitis and last exam. Was given an injection. States that he is 80% better doing the home exercises in the icing. Still having more pain seems to be localized when he is reaching across his body. States when he sleeps on the side still waking him up as well. States daily activities has been significantly better though.     Past Medical History  Diagnosis Date  . Asthma   . Anxiety     overlap with depression and ADD  . Hypertension   . Hepatitis A   . Migraines   . OSA on CPAP     Sleep study 12/2011: severe, CPAP  . Allergic rhinitis   . Migraine    Past Surgical History  Procedure Laterality Date  . Lasik  2005  . Wisdom tooth extraction     Social History   Social History  . Marital Status: Single    Spouse Name: N/A  . Number of Children: N/A  . Years of Education: N/A   Occupational History  . 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 Smoking status: Former Smoker -- 0.24 packs/day for 3 years    Types: Cigarettes    Quit date: 09/22/2008  . Smokeless tobacco: None  . Alcohol Use: 0.0 oz/week    0 Standard drinks or equivalent per week     Comment: OCCASSIONAL  . Drug Use: No  . Sexual Activity: Not Asked   Other Topics Concern  . 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 Erythromycin     Pt states med make him sick   Family History  Problem Relation Age of Onset  . Hypertension Mother   . Arthritis Father   . Hypertension Father   . Diabetes Other   .  Emphysema Paternal Grandfather   . Asthma Paternal Grandfather   . Lung cancer Maternal Grandfather     Past medical history, social, surgical and family history all reviewed in electronic medical record.  No pertanent information unless stated regarding to the chief complaint.   Review of Systems: No headache, visual changes, nausea, vomiting, diarrhea, constipation, dizziness, abdominal pain, skin rash, fevers, chills, night sweats, weight loss, swollen lymph nodes, body aches, joint swelling, muscle aches, chest pain, shortness of breath, mood changes.   Objective Blood pressure 118/82, pulse 78, weight 175 lb (79.379 kg).  General: No apparent distress alert and oriented x3 mood and affect normal, dressed appropriately.  HEENT: Pupils equal, extraocular movements intact  Respiratory: Patient's speak in full sentences and does not appear short of breath  Cardiovascular: No lower extremity edema, non tender, no erythema  Skin: Warm dry intact with no signs of infection or rash on extremities or on axial skeleton.  Abdomen: Soft nontender  Neuro: Cranial nerves II through XII are intact, neurovascularly intact in all extremities with 2+ DTRs and 2+ pulses.  Lymph: No lymphadenopathy of posterior or anterior cervical chain or axillae bilaterally.  Gait normal with  good balance and coordination.  MSK:  Non tender with full range of motion and good stability and symmetric strength and tone of , elbows, wrist, hip, knee and ankles bilaterally.  Shoulder: Right Inspection reveals no abnormalities, atrophy or asymmetry. Palpation is normal with no tenderness over AC joint or bicipital groove. ROM is full in all planes passively. Rotator cuff strength normal throughout. signs of impingement with positive Neer and Hawkin's tests, but negative empty can sign. Speeds and Yergason's tests normal. Positive crossover test Normal scapular function observed. No painful arc and no drop arm sign. No  apprehension sign    Procedure: Real-time Ultrasound Guided Injection of right acromioclavicular joint Device: GE Logiq E  Ultrasound guided injection is preferred based studies that show increased duration, increased effect, greater accuracy, decreased procedural pain, increased response rate with ultrasound guided versus blind injection.  Verbal informed consent obtained.  Time-out conducted.  Noted no overlying erythema, induration, or other signs of local infection.  Skin prepped in a sterile fashion.  Local anesthesia: Topical Ethyl chloride.  With sterile technique and under real time ultrasound guidance:  Joint visualized.  With a 25-gauge half-inch needle patient was injected with a total of 0.5 mL of 0.5% Marcaine and 1 mL of Kenalog 40 mg/dL  Completed without difficulty  Pain immediately resolved suggesting accurate placement of the medication.  Advised to call if fevers/chills, erythema, induration, drainage, or persistent bleeding.  Images permanently stored and available for review in the ultrasound unit.  Impression: Technically successful ultrasound guided injection.        Impression and Recommendations:     This case required medical decision making of moderate complexity.      Note: This dictation was prepared with Dragon dictation along with smaller phrase technology. Any transcriptional errors that result from this process are unintentional.

## 2016-02-21 NOTE — Patient Instructions (Signed)
Good to see you Ice is your friend On wall with heels, butt shoulder and head touching for a goal of 5 minutes daily  Keep tennis ball between shoulder blades with sitting.  Use flexeril at night Continue the other exercises on the neck See me again in 3 weeks and if still in pain we will try manipulation of the neck

## 2016-02-21 NOTE — Assessment & Plan Note (Signed)
Patient given injection today and tolerated the procedure well. We discussed icing regimen and home exercises. We discussed posture and ergonomics that could be beneficial as well. Patient will follow-up with me again in 3-4 weeks for further evaluation. If continuing have pain formal physical therapy would be warranted.

## 2016-02-21 NOTE — Assessment & Plan Note (Signed)
Asian did state that he is having upper back pain. We discussed with patient at great length on other problems. We discussed that posture and ergonomics can be beneficial. Will patient follows up he may be candidate for osteopathic manipulation and we will discuss in greater detail with further evaluation.

## 2016-04-01 ENCOUNTER — Ambulatory Visit: Payer: Self-pay | Admitting: Family Medicine

## 2016-04-02 ENCOUNTER — Encounter: Payer: Self-pay | Admitting: Family Medicine

## 2016-04-02 ENCOUNTER — Other Ambulatory Visit: Payer: Self-pay | Admitting: Internal Medicine

## 2016-04-02 ENCOUNTER — Ambulatory Visit (INDEPENDENT_AMBULATORY_CARE_PROVIDER_SITE_OTHER): Payer: BC Managed Care – PPO | Admitting: Family Medicine

## 2016-04-02 VITALS — BP 120/78 | HR 84 | Wt 179.0 lb

## 2016-04-02 DIAGNOSIS — M9901 Segmental and somatic dysfunction of cervical region: Secondary | ICD-10-CM

## 2016-04-02 DIAGNOSIS — F411 Generalized anxiety disorder: Secondary | ICD-10-CM | POA: Diagnosis not present

## 2016-04-02 DIAGNOSIS — M25511 Pain in right shoulder: Secondary | ICD-10-CM | POA: Diagnosis not present

## 2016-04-02 DIAGNOSIS — M9908 Segmental and somatic dysfunction of rib cage: Secondary | ICD-10-CM

## 2016-04-02 DIAGNOSIS — M546 Pain in thoracic spine: Secondary | ICD-10-CM | POA: Diagnosis not present

## 2016-04-02 DIAGNOSIS — G8929 Other chronic pain: Secondary | ICD-10-CM

## 2016-04-02 DIAGNOSIS — M999 Biomechanical lesion, unspecified: Secondary | ICD-10-CM | POA: Insufficient documentation

## 2016-04-02 DIAGNOSIS — M549 Dorsalgia, unspecified: Principal | ICD-10-CM

## 2016-04-02 DIAGNOSIS — M9902 Segmental and somatic dysfunction of thoracic region: Secondary | ICD-10-CM

## 2016-04-02 MED ORDER — AMPHETAMINE-DEXTROAMPHETAMINE 10 MG PO TABS
10.0000 mg | ORAL_TABLET | Freq: Two times a day (BID) | ORAL | Status: DC | PRN
Start: 2016-04-02 — End: 2016-05-14

## 2016-04-02 MED ORDER — AMPHETAMINE-DEXTROAMPHET ER 20 MG PO CP24: 20.0000 mg | ORAL_CAPSULE | ORAL | Status: DC

## 2016-04-02 MED ORDER — HYDROXYZINE HCL 10 MG PO TABS: 10.0000 mg | ORAL_TABLET | Freq: Three times a day (TID) | ORAL | Status: DC | PRN

## 2016-04-02 NOTE — Assessment & Plan Note (Signed)
Hydroxyzine for breakthrough. Less sedating then patients Xanax which can allow him to potentially study.

## 2016-04-02 NOTE — Assessment & Plan Note (Signed)
Decision today to treat with OMT was based on Physical Exam  After verbal consent patient was treated with HVLA, ME, FPR techniques in cervical, thoracic and rib areas  Patient tolerated the procedure well with improvement in symptoms  Patient given exercises, stretches and lifestyle modifications  See medications in patient instructions if given  Patient will follow up in 3-4 weeks      

## 2016-04-02 NOTE — Patient Instructions (Signed)
Good to see you  Ice 20 minutes 2 times daily. Usually after activity and before bed. On wall with heels, butt shoulder and head touching for a goal of 5 minutes daily  Stay active When feeling stress or even at night if in a little pain try hydroxyzine.  Can use up to 3 times a day but try at home first because it can make you sleepy See me again in 3-4 weeks.

## 2016-04-02 NOTE — Progress Notes (Signed)
Tony [name], Tony [name] Phone: [phone] Subjective:    I'm seeing this patient by the request  of:  Tony Sanes, Tony [name]   CC: right shoulder pain Follow-up  NGE:[id number] Tony [name] is a 40 y.o. male coming in with complaint of right shoulder pain. Patient did have a subacromial bursitis as well as acromioclavicular arthritis. Has had different injections and states that he is feeling 90% better. More tightness around the neck. Seems to be more secondary to some stress level he states. Continues to be doing a lot of studying. Patient states that it is more of a soreness that can keep him up at night. During the day he can tolerate it better. Has not taken any daytime medications for this pain.     Past Medical History  Diagnosis Date  . Asthma   . Anxiety     overlap with depression and ADD  . Hypertension   . Hepatitis A   . Migraines   . OSA on CPAP     Sleep study 12/2011: severe, CPAP  . Allergic rhinitis   . Migraine    Past Surgical History  Procedure Laterality Date  . Lasik  2005  . Wisdom tooth extraction     Social History   Social History  . Marital Status: Single    Spouse Name: N/A  . Number of Children: N/A  . Years of Education: N/A   Occupational History  . 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 Smoking status: Former Smoker -- 0.24 packs/day for 3 years    Types: Cigarettes    Quit date: 09/22/2008  . Smokeless tobacco: None  . Alcohol Use: 0.0 oz/week    0 Standard drinks or equivalent per week     Comment: OCCASSIONAL  . Drug Use: No  . Sexual Activity: Not Asked   Other Topics Concern  . 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 Erythromycin     Pt states med make him sick   Family History  Problem Relation Age of Onset  .  Hypertension Mother   . Arthritis Father   . Hypertension Father   . Diabetes Other   . Emphysema Paternal Grandfather   . Asthma Paternal Grandfather   . Lung cancer Maternal Grandfather     Past medical history, social, surgical and family history all reviewed in electronic medical record.  No pertanent information unless stated regarding to the chief complaint.   Review of Systems: No headache, visual changes, nausea, vomiting, diarrhea, constipation, dizziness, abdominal pain, skin rash, fevers, chills, night sweats, weight loss, swollen lymph nodes, body aches, joint swelling, chest pain, shortness of breath, mood changes.   Objective Blood pressure 120/78, pulse 84, weight 179 lb (81.194 kg).  General: No apparent distress alert and oriented x3 mood and affect normal, dressed appropriately. More anxious than previous exam HEENT: Pupils equal, extraocular movements intact  Respiratory: Patient's speak in full sentences and does not appear short of breath  Cardiovascular: No lower extremity edema, non tender, no erythema  Skin: Warm dry intact with no signs of infection or rash on extremities or on axial skeleton.  Abdomen: Soft nontender  Neuro: Cranial nerves II through XII are intact, neurovascularly intact in all extremities with 2+ DTRs and 2+  pulses.  Lymph: No lymphadenopathy of posterior or anterior cervical chain or axillae bilaterally.  Gait normal with good balance and coordination.  MSK:  Non tender with full range of motion and good stability and symmetric strength and tone of , elbows, wrist, hip, knee and ankles bilaterally.  Shoulder: Right Inspection reveals no abnormalities, atrophy or asymmetry. Palpation is normal with no tenderness over AC joint or bicipital groove. ROM is full in all planes passively. Rotator cuff strength normal throughout. Very mild impingement noted. Speeds and Yergason's tests normal. Negative crossover today Normal scapular function  observed. No painful arc and no drop arm sign. No apprehension sign Contralateral shoulder unremarkable  Neck: Inspection unremarkable. No palpable stepoffs. Negative Spurling's maneuver. Mild tightness in the terminal range of motion's. Significant tenderness over the trapezius bilaterally Grip strength and sensation normal in bilateral hands Strength good C4 to T1 distribution No sensory change to C4 to T1 Negative Hoffman sign bilaterally Reflexes normal  Osteopathic findings C2 flexed rotated and side bent right T3 extended rotated and side bent right with inhaled third rib T7 extended rotated and side bent left      Impression and Recommendations:     This case required medical decision making of moderate complexity.      Note: This dictation was prepared with Dragon dictation along with smaller phrase technology. Any transcriptional errors that result from this process are unintentional.

## 2016-04-02 NOTE — Assessment & Plan Note (Signed)
Believe the patient's chronic upper back pain is secondary to poor posture as well as stress. Patient given a prescription for hydroxyzine to see if this will be beneficial for him. We discussed icing regimen. Discussed home exercises. Discussed medication techniques. Responded well to osteopathic manipulation and will follow-up again in 3-4 weeks for further evaluation and treatment.

## 2016-04-02 NOTE — Assessment & Plan Note (Signed)
Better after the injection. We will continue to monitor. Can repeat every 3 months if necessary.

## 2016-04-03 ENCOUNTER — Other Ambulatory Visit: Payer: Self-pay | Admitting: Internal Medicine

## 2016-04-07 ENCOUNTER — Other Ambulatory Visit: Payer: Self-pay | Admitting: Emergency Medicine

## 2016-04-07 MED ORDER — ALPRAZOLAM 0.5 MG PO TABS: ORAL_TABLET | ORAL | Status: DC

## 2016-04-07 NOTE — Telephone Encounter (Signed)
RX faxed to [facility] 

## 2016-04-29 ENCOUNTER — Ambulatory Visit: Payer: BC Managed Care – PPO | Admitting: Family Medicine

## 2016-05-14 ENCOUNTER — Other Ambulatory Visit: Payer: Self-pay | Admitting: Internal Medicine

## 2016-05-15 MED ORDER — AMPHETAMINE-DEXTROAMPHET ER 20 MG PO CP24
20.0000 mg | ORAL_CAPSULE | ORAL | 0 refills | Status: DC
Start: 2016-05-15 — End: 2016-06-17

## 2016-05-15 MED ORDER — AMPHETAMINE-DEXTROAMPHETAMINE 10 MG PO TABS: 10.0000 mg | ORAL_TABLET | Freq: Two times a day (BID) | ORAL | 0 refills | Status: DC | PRN

## 2016-06-17 ENCOUNTER — Encounter: Payer: Self-pay | Admitting: Internal Medicine

## 2016-06-17 ENCOUNTER — Ambulatory Visit (INDEPENDENT_AMBULATORY_CARE_PROVIDER_SITE_OTHER): Payer: BC Managed Care – PPO | Admitting: Internal Medicine

## 2016-06-17 VITALS — BP 130/84 | HR 94 | Temp 98.8°F | Resp 16 | Wt 180.0 lb

## 2016-06-17 DIAGNOSIS — F909 Attention-deficit hyperactivity disorder, unspecified type: Secondary | ICD-10-CM

## 2016-06-17 DIAGNOSIS — K219 Gastro-esophageal reflux disease without esophagitis: Secondary | ICD-10-CM | POA: Insufficient documentation

## 2016-06-17 DIAGNOSIS — F988 Other specified behavioral and emotional disorders with onset usually occurring in childhood and adolescence: Secondary | ICD-10-CM

## 2016-06-17 DIAGNOSIS — Z23 Encounter for immunization: Secondary | ICD-10-CM | POA: Diagnosis not present

## 2016-06-17 DIAGNOSIS — I10 Essential (primary) hypertension: Secondary | ICD-10-CM | POA: Diagnosis not present

## 2016-06-17 DIAGNOSIS — F331 Major depressive disorder, recurrent, moderate: Secondary | ICD-10-CM

## 2016-06-17 DIAGNOSIS — E663 Overweight: Secondary | ICD-10-CM | POA: Diagnosis not present

## 2016-06-17 DIAGNOSIS — F411 Generalized anxiety disorder: Secondary | ICD-10-CM

## 2016-06-17 DIAGNOSIS — J4520 Mild intermittent asthma, uncomplicated: Secondary | ICD-10-CM

## 2016-06-17 MED ORDER — PANTOPRAZOLE SODIUM 40 MG PO TBEC: 40.0000 mg | DELAYED_RELEASE_TABLET | Freq: Every day | ORAL | 3 refills | Status: DC

## 2016-06-17 MED ORDER — AMPHETAMINE-DEXTROAMPHET ER 30 MG PO CP24: 30.0000 mg | ORAL_CAPSULE | ORAL | 0 refills | Status: DC

## 2016-06-17 NOTE — Patient Instructions (Addendum)
   All other Health Maintenance issues reviewed.   All recommended immunizations and age-appropriate screenings are up-to-date or discussed.  Flu vaccine administered today.   Medications reviewed and updated.  Changes include increasing the Adderall to 30 mg daily.  We will also start protonix for your heartburn.   Your prescription(s) have been submitted to your pharmacy. Please take as directed and contact our office if you believe you are having problem(s) with the medication(s).   Please followup in 6 months

## 2016-06-17 NOTE — Assessment & Plan Note (Signed)
Continue allergy medication and xopenex as needed Overall controlled

## 2016-06-17 NOTE — Progress Notes (Signed)
Subjective:    Patient ID: Tony [name], male    DOB: October 19, 1975, 40 y.o.   MRN: [id number]  HPI He is here for follow up.  ADD:  He is taking his medication as prescribed.  He feels the medication is not as effective as it was.  The 20 mg is not as effective.   He denies side effects, including palpitations, headaches, lightheadedness, decreased appetite and weight loss.  He wonders about increasing the extended release and stopping the quick release medication.    Hypertension: He is taking his medication daily. He is compliant with a low sodium diet.  He denies chest pain, palpitations, edema, shortness of breath and regular headaches. He is exercising irregularly.    Right ear fluid feeling:  He has a few days of feeling like there is fluid in the ear.  He denies pain or changes in hearing.    ED:  He is having less erections and is not having morning erections often.  He was unsure if this was a sign of something more serious.  It does not bother him too much.  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   Anxiety: He is taking his medication daily as prescribed. He denies any side effects from the medication. He feels his anxiety is not well controlled and he is thinking about seeing a psychiatrist.   Overweight:  He is taking the saxenda.  He is very busy with work/school and has increased stress.  He has not been able to exercise regularly, but tries to walk when he is able.    Asthma:  He uses his xopenex as needed only.  He is taking his allergy medication.  He feels his asthma is well controlled, but has had some occasional cough and wheeze recently.  He does have some symptoms when the weather changes.     Medications and allergies reviewed with patient and updated if appropriate.  Patient Active Problem List   Diagnosis Date Noted  . Nonallopathic lesion of cervical region 04/02/2016  . Nonallopathic lesion of thoracic region 04/02/2016  .  Nonallopathic lesion-rib cage 04/02/2016  . Pain in right acromioclavicular joint 02/21/2016  . Bursitis of right shoulder 01/25/2016  . Chronic upper back pain 12/18/2015  . Major depressive disorder, recurrent episode, moderate (HCC) 05/31/2015  . Overweight   . Asthma, mild intermittent 03/31/2013  . Migraine   . Obstructive sleep apnea   . Generalized anxiety disorder   . Seasonal and perennial allergic rhinitis   . Essential hypertension   . ADD (attention deficit disorder)     Current Outpatient Prescriptions on File Prior to Visit  Medication Sig Dispense Refill  . acyclovir ointment (ZOVIRAX) 5 % APPLY TOPICALLY EVERY 3 HOURS 15 g 0  . ALPRAZolam (XANAX) 0.5 MG tablet TAKE 1 TABLET BY MOUTH TWICE DAILY AS NEEDED FOR ANXIETY OR SLEEP 60 tablet 0  . amphetamine-dextroamphetamine (ADDERALL XR) 20 MG 24 hr capsule Take 1 capsule (20 mg total) by mouth every morning. 30 capsule 0  . amphetamine-dextroamphetamine (ADDERALL) 10 MG tablet Take 1 tablet (10 mg total) by mouth 2 (two) times daily as needed. 60 tablet 0  . azelastine (ASTELIN) 137 MCG/SPRAY nasal spray Place 2 sprays into the nose 2 (two) times daily. Use in each nostril as directed 30 mL 2  . cetirizine (ZYRTEC) 10 MG tablet Take 1 tablet (10 mg total) by mouth daily. 90 tablet 1  .  cyclobenzaprine (FLEXERIL) 5 MG tablet TAKE 1 TABLET 3 TIMES A DAYAS NEEDED FOR MUSCLE SPASMS 60 tablet 0  . Diclofenac Sodium (PENNSAID) 2 % SOLN Place 2 application onto the skin 2 (two) times daily. 112 g 3  . fluticasone (FLONASE) 50 MCG/ACT nasal spray USE TWO SPRAY IN NOSE ONCE DAILY 16 g 5  . hydrOXYzine (ATARAX/VISTARIL) 10 MG tablet Take 1 tablet (10 mg total) by mouth 3 (three) times daily as needed. 90 tablet 1  . indomethacin (INDOCIN) 50 MG capsule TAKE 1 CAPSULE 3 TIMES A   DAY AS NEEDED 270 capsule 0  . Insulin Pen Needle (NOVOFINE) 32G X 6 MM MISC Needles to be used with injecting rx for saxenda 100 each 1  . levalbuterol  (XOPENEX HFA) 45 MCG/ACT inhaler Inhale 2 puffs into the lungs every 6 (six) hours as needed for wheezing. 3 Inhaler 3  . Liraglutide -Weight Management (SAXENDA) 18 MG/3ML SOPN Inject 3 mg into the skin daily. 15 mL 5  . losartan-hydrochlorothiazide (HYZAAR) 50-12.5 MG per tablet Take 1 tablet by mouth daily. 90 tablet 3  . sertraline (ZOLOFT) 50 MG tablet Take 1 tablet (50 mg total) by mouth daily. 90 tablet 0  . SUMAtriptan (IMITREX) 50 MG tablet TAKE 1 TABLET BY MOUTH EVERY 2 HOURS AS NEEDED FOR MIGRAINE *MAY REPEAT IN 2 HOURS IF HEADACHE PERSISTS OR RECURS* MAX 2 TABS/24 HOURS 12 tablet 3   No current facility-administered medications on file prior to visit.     Past Medical History:  Diagnosis Date  . Allergic rhinitis   . Anxiety    overlap with depression and ADD  . Asthma   . Hepatitis A   . Hypertension   . Migraine   . Migraines   . OSA on CPAP    Sleep study 12/2011: severe, CPAP <MEASURSan Miguel Corp Alta Vista RegioColorado Mental Health Institute htt([phone])[facility]37l O>9Isidoro DonninReyes IvaWMaryland60<MEASReston SurgePremier Surgihtt2524422F>9Isidoro DonninReyes IvaWMaryland60<MEASUREMENBedMemorial Hermann Bay Area Endoscopy Center LLC Dba Bay ArJack C. Montgomery Va htt31770[facility] Fr26anc>9Isidoro DonninReyes IvaWMaryland60<MECrossridge CommunOutpatienhtt([phone] [place] M16emo>9Isidoro DonninReyes IvaWMaryland60<MEASUREMPromedica HerrMile High [phone] North East Allianc11e S>9Isidoro DonninReyes IvaWMaryland60<MEASUREUniversity General HosKing'S Daughters' Hospital And [phone])[place] 42Gen>9Isidoro DonninReyes IvaWMaryland60<MEASArizona StHastings [phone]) River 41Fal>9Isidoro DonninReyes IvaWMaryland60<MEASUREMENHca Houston Healthcare Northwest MeTulsa Erhtt([phone] Surgi77cal>9Isidoro DonninReyes IvaWMaryland60<MEASUNorthridge Outpatient SurgerSouthern Virginia Mental Healthhtt2986-[place] Reg56ion>9Isidoro DonninReyes IvaWMaryland60<MEASUREClearview SurgerCentral Arihtt763([phone])L65uth>9Isidoro DonninReyes IvaWMaryland60<MEASUREMEWillis-Knighton South & Center For WoUtmb Angleton-Danburyhtt507351-Hshs St Eliz59abe>9Isidoro DonninReyes IvaWMaryland60<MEASURVictoria Ambulatory Surgery Center Dba The SuHudson [phone] [facility] [facility] Su36rge>9Isidoro DonninReyes IvaWMaryland60<MEASUREMENNortPhysicians Surgery Center Of Tempe LLC Dba Physicians Surgery CenSj East Campus LLC Asc Dba Denver SurgerPhtt267630-Marshfie97ld >9Isidoro DonninReyes IvaWMaryland60<MEASUREMSelect SpecialtyOphthalmic Outpatient Surgery CenterMaryland60<MEASUREMENNorEncompass Health RehabilitatBaylor Scott & White Medical Centhtt316718 St Vincent Heart Cente62r O>9Isidoro DonninReyes IvaWMaryland60<MEASGranite Peaks ELaser Surgery [phone])Sarah Bush Linco31ln >9Isidoro DonninReyes IvaWMaryland60<MEASULavaca MeForrest Generhtt30397Suncoast Su55rge>9Isidoro DonninReyes IvaWMaryland60<MEASUREMENArmc Behavioral HRiverside Hospital Of Louishtt(260281 Center108sto>9Isidoro DonninReyes IvaWMaryland60<MEASUREMESmokey Point BehaivoEndoscopy Centehtt682928-Crosbyton37 Cl>9Isidoro DonninReyes IvaWMaryland60<MEASMemorial Regional HoHamilton Endoscopy And Surgery Cehtt313442-[place] 42Arr>9Isidoro DonninReyes IvaWMaryland60<MEASURWhiting ForenReston [phone]) Surgery Cent31er >9Isidoro DonninReyes IvaWMaryland60<MEASURNorthern Arizona Va HealtShelby Baptist htt248715-Banner82 Pa>9Isidoro DonninReyes IvaWMaryland60<MEASUREMVa Medical CenteEndless Mountains [phone])Endoscopy Cen35ter>9Isidoro DonninReyes IvaWMaryland60<MEASCasey CouCheshire Medichtt509865-Four Seasons Endo19sco>9Isidoro DonninReyes IvaWMaryland60<MEASURESurgicare Center Of Idaho LLC Dba HellingsteaBoca Raton Regiohtt(626825-Retinal Ambulatory Surgery Center27 Of>9Isidoro DonninReyes IvaWMaryland60<MEASEncompass Health Rehabilitation Hospital OVchtt([phone])Sanford Luvern50e M>9Isidoro DonninReyes IvaWMaryland60.4rrick Parisianyeset/ History:  Procedure Laterality Date  . LASIK  2005  . WISDOM TOOTH EXTRACTION      Social History   Social History  . Marital status: Single    Spouse name: N/A  . Number of children: N/A  . Years of education: N/A   Occupational History  . 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 Smoking status: Former Smoker    Packs/day: 0.24    Years: 3.00    Types: Cigarettes    Quit date: 09/22/2008  . Smokeless tobacco: Not on file  . Alcohol use 0.0 oz/week     Comment: OCCASSIONAL  . Drug use: No  . Sexual activity: Not on file   Other Topics Concern  . 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 Hypertension Mother   . Arthritis Father   . Hypertension Father   . Diabetes Other   . Emphysema Paternal Grandfather   . Asthma Paternal Grandfather   . Lung cancer Maternal Grandfather     Review  of Systems  Constitutional: Negative for fever.  Respiratory: Positive for cough (mild with weather changes) and wheezing (mild with weather changes). Negative for shortness of breath.   Cardiovascular: Negative for chest pain, palpitations and leg swelling.  Gastrointestinal: Negative for abdominal pain.       Gerd  Neurological: Negative for dizziness, light-headedness and headaches.       Objective:   Vitals:  06/17/16 1043  BP: 130/84  Pulse: 94  Resp: 16  Temp: 98.8 F (37.1 C)   Filed Weights   06/17/16 1043  Weight: 180 lb (81.6 kg)   Body mass index is 28.62 kg/m.   Physical Exam Constitutional: Appears well-developed and well-nourished. No distress.  HENT:  Head: Normocephalic and atraumatic.  Neck: Neck supple. No tracheal deviation present. No thyromegaly present.  No cervical lymphadenopathy.. B/l ear canals and TM normal Cardiovascular: Normal rate, regular rhythm and normal heart sounds.   No murmur heard. No carotid bruit .  No edema Pulmonary/Chest: Effort normal and breath sounds normal. No respiratory distress. No has no wheezes. No rales.  Skin: Skin is warm and dry. Not diaphoretic.  Psychiatric: Normal mood and affect. Behavior is normal.       Assessment & Plan:   Flu vaccine   See Problem List for Assessment and Plan of chronic medical problems.   F/u in 6 months, sooner if needed

## 2016-06-17 NOTE — Assessment & Plan Note (Signed)
Having frequent symptoms - related to weight gain, stress and not eating well Not controlled with OTC zantac Start protonix 40 mg daily

## 2016-06-17 NOTE — Assessment & Plan Note (Signed)
Not controlled Will look into see psych Continue current medications for now

## 2016-06-17 NOTE — Assessment & Plan Note (Signed)
Controlled, stable Continue current dose of medication  

## 2016-06-17 NOTE — Assessment & Plan Note (Signed)
Still taking saxenda which helps Not exercising regularly, but trying to walk as much as he can Needs to improve diet Will continue saxenda

## 2016-06-17 NOTE — Progress Notes (Signed)
Patient received education resource, including the self-management goal and tool. Patient verbalized understanding. 

## 2016-06-17 NOTE — Assessment & Plan Note (Signed)
BP well controlled Current regimen effective and well tolerated Continue current medications at current doses  

## 2016-06-17 NOTE — Assessment & Plan Note (Addendum)
Not ideally controlled Will try increased XR dose to 30 mg daily and discontinuing immediate release dose of 10 mg

## 2016-06-19 ENCOUNTER — Other Ambulatory Visit: Payer: Self-pay | Admitting: Internal Medicine

## 2016-06-23 ENCOUNTER — Other Ambulatory Visit: Payer: Self-pay

## 2016-06-23 ENCOUNTER — Telehealth: Payer: Self-pay

## 2016-06-23 MED ORDER — ALPRAZOLAM 0.5 MG PO TABS: ORAL_TABLET | ORAL | 0 refills | Status: DC

## 2016-06-23 NOTE — Telephone Encounter (Signed)
RX faxed to [facility] 

## 2016-06-23 NOTE — Telephone Encounter (Signed)
Last refill was 04/07/16. Please advise

## 2016-06-23 NOTE — Telephone Encounter (Signed)
PA APPROVED 06/23/2016 - 06/24/2019

## 2016-06-23 NOTE — Telephone Encounter (Signed)
PA initiated via CoverMyMeds key [id number]

## 2016-08-05 ENCOUNTER — Other Ambulatory Visit: Payer: Self-pay | Admitting: Internal Medicine

## 2016-08-06 ENCOUNTER — Other Ambulatory Visit: Payer: Self-pay | Admitting: Emergency Medicine

## 2016-08-06 MED ORDER — AMPHETAMINE-DEXTROAMPHET ER 30 MG PO CP24: 30.0000 mg | ORAL_CAPSULE | ORAL | 0 refills | Status: DC

## 2016-08-06 NOTE — Telephone Encounter (Signed)
RX printed and signed. Pt notified through MyChart.

## 2016-09-28 ENCOUNTER — Encounter: Payer: Self-pay | Admitting: Internal Medicine

## 2016-09-28 NOTE — Progress Notes (Signed)
Subjective:    Patient ID: DELBERT [name], male    DOB: 08-28-1976, 41 y.o.   MRN: [id number]  HPI Error    Medications and allergies reviewed with patient and updated if appropriate.  Patient Active Problem List   Diagnosis Date Noted  . Esophageal reflux 06/17/2016  . Nonallopathic lesion of cervical region 04/02/2016  . Nonallopathic lesion of thoracic region 04/02/2016  . Nonallopathic lesion-rib cage 04/02/2016  . Pain in right acromioclavicular joint 02/21/2016  . Bursitis of right shoulder 01/25/2016  . Chronic upper back pain 12/18/2015  . Major depressive disorder, recurrent episode, moderate (HCC) 05/31/2015  . Overweight   . Asthma, mild intermittent 03/31/2013  . Migraine   . Obstructive sleep apnea   . Generalized anxiety disorder   . Seasonal and perennial allergic rhinitis   . Essential hypertension   . ADD (attention deficit disorder)     Current Outpatient Prescriptions on File Prior to Visit  Medication Sig Dispense Refill  . acyclovir ointment (ZOVIRAX) 5 % APPLY TOPICALLY EVERY 3 HOURS 15 g 0  . ALPRAZolam (XANAX) 0.5 MG tablet TAKE 1 TABLET BY MOUTH TWICE DAILY AS NEEDED FOR ANXIETY OR SLEEP 60 tablet 0  . amphetamine-dextroamphetamine (ADDERALL XR) 30 MG 24 hr capsule Take 1 capsule (30 mg total) by mouth every morning. 30 capsule 0  . azelastine (ASTELIN) 137 MCG/SPRAY nasal spray Place 2 sprays into the nose 2 (two) times daily. Use in each nostril as directed 30 mL 2  . cetirizine (ZYRTEC) 10 MG tablet Take 1 tablet (10 mg total) by mouth daily. 90 tablet 1  . cyclobenzaprine (FLEXERIL) 5 MG tablet TAKE 1 TABLET 3 TIMES A DAYAS NEEDED FOR MUSCLE SPASMS 60 tablet 0  . Diclofenac Sodium (PENNSAID) 2 % SOLN Place 2 application onto the skin 2 (two) times daily. 112 g 3  . fluticasone (FLONASE) 50 MCG/ACT nasal spray USE TWO SPRAY IN NOSE ONCE DAILY 16 g 5  . hydrOXYzine (ATARAX/VISTARIL) 10 MG tablet Take 1 tablet (10 mg total) by mouth 3 (three)  times daily as needed. 90 tablet 1  . indomethacin (INDOCIN) 50 MG capsule TAKE 1 CAPSULE 3 TIMES A   DAY AS NEEDED 270 capsule 0  . Insulin Pen Needle (NOVOFINE) 32G X 6 MM MISC Needles to be used with injecting rx for saxenda 100 each 1  . levalbuterol (XOPENEX HFA) 45 MCG/ACT inhaler Inhale 2 puffs into the lungs every 6 (six) hours as needed for wheezing. 3 Inhaler 3  . Liraglutide -Weight Management (SAXENDA) 18 MG/3ML SOPN Inject 3 mg into the skin daily. 15 mL 5  . losartan-hydrochlorothiazide (HYZAAR) 50-12.5 MG per tablet Take 1 tablet by mouth daily. 90 tablet 3  . pantoprazole (PROTONIX) 40 MG tablet Take 1 tablet (40 mg total) by mouth daily. 90 tablet 3  . sertraline (ZOLOFT) 50 MG tablet Take 1 tablet (50 mg total) by mouth daily. 90 tablet 0  . sertraline (ZOLOFT) 50 MG tablet TAKE 1 TABLET DAILY 90 tablet 0  . SUMAtriptan (IMITREX) 50 MG tablet TAKE 1 TABLET BY MOUTH EVERY 2 HOURS AS NEEDED FOR MIGRAINE *MAY REPEAT IN 2 HOURS IF HEADACHE PERSISTS OR RECURS* MAX 2 TABS/24 HOURS 12 tablet 3   No current facility-administered medications on file prior to visit.     Past Medical History:  Diagnosis Date  . Allergic rhinitis   . Anxiety    overlap with depression and ADD  . Asthma   . Hepatitis A   .  Hypertension   . Migraine   . Migraines   . OSA on CPAP    Sleep study 12/2011: severe, CPAP 10mmHg    Past Surgical History:  Procedure Laterality Date  . LASIK  2005  . WISDOM TOOTH EXTRACTION      Social History   Social History  . Marital status: Single    Spouse name: N/A  . Number of children: N/A  . Years of education: N/A   Occupational History  . 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 Smoking status: Former Smoker    Packs/day: 0.24    Years: 3.00    Types: Cigarettes    Quit date: 09/22/2008  . Smokeless tobacco: Not on file  . Alcohol use 0.0 oz/week     Comment: OCCASSIONAL  . Drug use: No  . Sexual activity:  Not on file   Other Topics Concern  . 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 Hypertension Mother   . Arthritis Father   . Hypertension Father   . Diabetes Other   . Emphysema Paternal Grandfather   . Asthma Paternal Grandfather   . Lung cancer Maternal Grandfather     Review of Systems     Objective:  There were no vitals filed for this visit. Wt Readings from Last 3 Encounters:  06/17/16 180 lb (81.6 kg)  04/02/16 179 lb (81.2 kg)  02/21/16 175 lb (79.4 kg)   There is no height or weight on file to calculate BMI.   Physical Exam         Assessment & Plan:       This encounter was created in error - please disregard.

## 2016-09-29 ENCOUNTER — Encounter: Payer: Self-pay | Admitting: Internal Medicine

## 2016-09-29 ENCOUNTER — Ambulatory Visit (INDEPENDENT_AMBULATORY_CARE_PROVIDER_SITE_OTHER): Payer: BC Managed Care – PPO | Admitting: Internal Medicine

## 2016-09-29 ENCOUNTER — Other Ambulatory Visit: Payer: Self-pay | Admitting: Internal Medicine

## 2016-09-29 ENCOUNTER — Ambulatory Visit (INDEPENDENT_AMBULATORY_CARE_PROVIDER_SITE_OTHER)
Admission: RE | Admit: 2016-09-29 | Discharge: 2016-09-29 | Disposition: A | Discharge status: Home / Self Care | Payer: BC Managed Care – PPO | Source: Ambulatory Visit | Attending: Internal Medicine | Admitting: Internal Medicine

## 2016-09-29 ENCOUNTER — Encounter: Payer: Self-pay | Admitting: Emergency Medicine

## 2016-09-29 VITALS — BP 144/90 | HR 85 | Temp 98.4°F | Resp 16 | Wt 190.0 lb

## 2016-09-29 DIAGNOSIS — J4520 Mild intermittent asthma, uncomplicated: Secondary | ICD-10-CM

## 2016-09-29 DIAGNOSIS — K219 Gastro-esophageal reflux disease without esophagitis: Secondary | ICD-10-CM

## 2016-09-29 DIAGNOSIS — F988 Other specified behavioral and emotional disorders with onset usually occurring in childhood and adolescence: Secondary | ICD-10-CM

## 2016-09-29 DIAGNOSIS — J209 Acute bronchitis, unspecified: Secondary | ICD-10-CM

## 2016-09-29 DIAGNOSIS — I10 Essential (primary) hypertension: Secondary | ICD-10-CM

## 2016-09-29 DIAGNOSIS — J4521 Mild intermittent asthma with (acute) exacerbation: Secondary | ICD-10-CM

## 2016-09-29 DIAGNOSIS — J4541 Moderate persistent asthma with (acute) exacerbation: Secondary | ICD-10-CM | POA: Insufficient documentation

## 2016-09-29 MED ORDER — PREDNISONE 10 MG PO TABS: ORAL_TABLET | ORAL | 0 refills | Status: DC

## 2016-09-29 MED ORDER — HYDROCODONE-HOMATROPINE 5-1.5 MG/5ML PO SYRP: 5.0000 mL | ORAL_SOLUTION | Freq: Three times a day (TID) | ORAL | 0 refills | Status: DC | PRN

## 2016-09-29 MED ORDER — METHYLPREDNISOLONE ACETATE 80 MG/ML IJ SUSP
80.0000 mg | Freq: Once | INTRAMUSCULAR | Status: AC
  Administered 2016-09-29: 80 mg via INTRAMUSCULAR

## 2016-09-29 MED ORDER — AMPHETAMINE-DEXTROAMPHET ER 30 MG PO CP24: 30.0000 mg | ORAL_CAPSULE | ORAL | 0 refills | Status: DC

## 2016-09-29 MED ORDER — ALBUTEROL SULFATE (2.5 MG/3ML) 0.083% IN NEBU: 2.5000 mg | INHALATION_SOLUTION | Freq: Four times a day (QID) | RESPIRATORY_TRACT | 12 refills | Status: DC | PRN

## 2016-09-29 NOTE — Patient Instructions (Addendum)
You received a steroid injection.  Continue the antibiotic.  Start the prednisone taper tomorrow -take with food and in the morning.   Have a chest xray today.    Please followup in 6 months

## 2016-09-29 NOTE — Assessment & Plan Note (Signed)
Controlled, stable Continue current medication at current dose Due for refill - refill today

## 2016-09-29 NOTE — Addendum Note (Signed)
Addended by: Zenovia JordanMITCHELL, TAYLOR B on: 09/29/2016 04:49 PM   Modules accepted: Orders

## 2016-09-29 NOTE — Assessment & Plan Note (Signed)
Has bacterial bronchitis - will rule out PNA with cxr today Finish levaquin Has asthma exacerbation on top of bronchitis which is causing may of his symptoms now Cough syrup with hydrocodone Prednisone - Depo medrol 80 mg IM x 1 and then prednisone taper starting tomorrow Nebulizer machine ordered 

## 2016-09-29 NOTE — Assessment & Plan Note (Signed)
BP well controlled- elevated today due to URI Current regimen effective and well tolerated Continue current medications at current doses

## 2016-09-29 NOTE — Assessment & Plan Note (Signed)
Has bacterial bronchitis - will rule out PNA with cxr today Finish levaquin Has asthma exacerbation on top of bronchitis which is causing may of his symptoms now Cough syrup with hydrocodone Prednisone - Depo medrol 80 mg IM x 1 and then prednisone taper starting tomorrow Nebulizer machine ordered

## 2016-09-29 NOTE — Addendum Note (Signed)
Addended by: Zenovia JordanMITCHELL, Zayden Maffei B on: 09/29/2016 04:12 PM   Modules accepted: Orders

## 2016-09-29 NOTE — Assessment & Plan Note (Signed)
GERD controlled Continue daily medication  

## 2016-09-29 NOTE — Progress Notes (Signed)
Pre visit review using our clinic review tool, if applicable. No additional management support is needed unless otherwise documented below in the visit note. 

## 2016-09-29 NOTE — Progress Notes (Signed)
Subjective:    Patient ID: Tony [name], male    DOB: [birth year], 41 y.o.   MRN: [id number]  HPI The patient is here for follow up.  Cold symptoms:  His symptoms started 12/30.  He was started on levaquin 6 days ago and feels no better.  He has been taking nyquil, advil.  He has started nebulizer treatment ( dad has one).  His xopenex did not help. Marland KitchenHe was diagnosed with sinusitis and bronchitis.  Now, he has tightness in his chest, cough, pain from coughing, sinus pain, ear pain.    Hypertension: He is taking his medication daily. He is compliant with a low sodium diet.  He denies chest pain, palpitations, edema, shortness of breath and regular headaches. He is exercising regularly.  He does not monitor his blood pressure at home.    GERD:  He is taking his medication daily as prescribed.  He denies any GERD symptoms and feels his GERD is well controlled.    Medications and allergies reviewed with patient and updated if appropriate.  Patient Active Problem List   Diagnosis Date Noted  . Esophageal reflux 06/17/2016  . Nonallopathic lesion of cervical region 04/02/2016  . Nonallopathic lesion of thoracic region 04/02/2016  . Nonallopathic lesion-rib cage 04/02/2016  . Pain in right acromioclavicular joint 02/21/2016  . Bursitis of right shoulder 01/25/2016  . Chronic upper back pain 12/18/2015  . Major depressive disorder, recurrent episode, moderate (HCC) 05/31/2015  . Overweight   . Asthma, mild intermittent 03/31/2013  . Migraine   . Obstructive sleep apnea   . Generalized anxiety disorder   . Seasonal and perennial allergic rhinitis   . Essential hypertension   . ADD (attention deficit disorder)     Current Outpatient Prescriptions on File Prior to Visit  Medication Sig Dispense Refill  . acyclovir ointment (ZOVIRAX) 5 % APPLY TOPICALLY EVERY 3 HOURS 15 g 0  . ALPRAZolam (XANAX) 0.5 MG tablet TAKE 1 TABLET BY MOUTH TWICE DAILY AS NEEDED FOR ANXIETY OR SLEEP 60 tablet  0  . amphetamine-dextroamphetamine (ADDERALL XR) 30 MG 24 hr capsule Take 1 capsule (30 mg total) by mouth every morning. 30 capsule 0  . azelastine (ASTELIN) 137 MCG/SPRAY nasal spray Place 2 sprays into the nose 2 (two) times daily. Use in each nostril as directed 30 mL 2  . cetirizine (ZYRTEC) 10 MG tablet Take 1 tablet (10 mg total) by mouth daily. 90 tablet 1  . cyclobenzaprine (FLEXERIL) 5 MG tablet TAKE 1 TABLET 3 TIMES A DAYAS NEEDED FOR MUSCLE SPASMS 60 tablet 0  . Diclofenac Sodium (PENNSAID) 2 % SOLN Place 2 application onto the skin 2 (two) times daily. 112 g 3  . fluticasone (FLONASE) 50 MCG/ACT nasal spray USE TWO SPRAY IN NOSE ONCE DAILY 16 g 5  . hydrOXYzine (ATARAX/VISTARIL) 10 MG tablet Take 1 tablet (10 mg total) by mouth 3 (three) times daily as needed. 90 tablet 1  . indomethacin (INDOCIN) 50 MG capsule TAKE 1 CAPSULE 3 TIMES A   DAY AS NEEDED 270 capsule 0  . Insulin Pen Needle (NOVOFINE) 32G X 6 MM MISC Needles to be used with injecting rx for saxenda 100 each 1  . levalbuterol (XOPENEX HFA) 45 MCG/ACT inhaler Inhale 2 puffs into the lungs every 6 (six) hours as needed for wheezing. 3 Inhaler 3  . Liraglutide -Weight Management (SAXENDA) 18 MG/3ML SOPN Inject 3 mg into the skin daily. 15 mL 5  . losartan-hydrochlorothiazide (HYZAAR) 50-12.5 MG  per tablet Take 1 tablet by mouth daily. 90 tablet 3  . pantoprazole (PROTONIX) 40 MG tablet Take 1 tablet (40 mg total) by mouth daily. 90 tablet 3  . sertraline (ZOLOFT) 50 MG tablet TAKE 1 TABLET DAILY 90 tablet 0  . SUMAtriptan (IMITREX) 50 MG tablet TAKE 1 TABLET BY MOUTH EVERY 2 HOURS AS NEEDED FOR MIGRAINE *MAY REPEAT IN 2 HOURS IF HEADACHE PERSISTS OR RECURS* MAX 2 TABS/24 HOURS 12 tablet 3   No current facility-administered medications on file prior to visit.     Past Medical History:  Diagnosis Date  . Allergic rhinitis   . Anxiety    overlap with depression and ADD  . Asthma   . Hepatitis A   . Hypertension   .  Migraine   . Migraines   . OSA on CPAP    Sleep study 12/2011: severe, CPAP 10mmHg    Past Surgical History:  Procedure Laterality Date  . LASIK  2005  . WISDOM TOOTH EXTRACTION      Social History   Social History  . Marital status: Single    Spouse name: N/A  . Number of children: N/A  . Years of education: N/A   Occupational History  . 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 Smoking status: Former Smoker    Packs/day: 0.24    Years: 3.00    Types: Cigarettes    Quit date: 09/22/2008  . Smokeless tobacco: Not on file  . Alcohol use 0.0 oz/week     Comment: OCCASSIONAL  . Drug use: No  . Sexual activity: Not on file   Other Topics Concern  . 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 Hypertension Mother   . Arthritis Father   . Hypertension Father   . Lung cancer Maternal Grandfather   . Diabetes Other   . Emphysema Paternal Grandfather   . Asthma Paternal Grandfather     Review of Systems  Constitutional: Positive for chills and fatigue. Negative for fever.  HENT: Positive for congestion, ear pain, sinus pain and sore throat.   Respiratory: Positive for cough, chest tightness, shortness of breath and wheezing.   Cardiovascular: Positive for chest pain (from coughing). Negative for palpitations and leg swelling.  Gastrointestinal: Negative for abdominal pain, diarrhea and nausea.       GERD controlled  Neurological: Positive for light-headedness (with coughing too much). Negative for headaches.       Objective:   Vitals:   09/29/16 1438  BP: (!) 144/90  Pulse: 85  Resp: 16  Temp: 98.4 F (36.9 C)   Wt Readings from Last 3 Encounters:  09/29/16 190 lb (86.2 kg)  06/17/16 180 lb (81.6 kg)  04/02/16 179 lb (81.2 kg)   Body mass index is 30.21 kg/m.   Physical Exam    GENERAL  APPEARANCE: Appears stated age, well appearing, NAD EYES: conjunctiva clear, no icterus HEENT: bilateral tympanic membranes and ear canals normal, oropharynx with mild erythema, no thyromegaly, trachea midline, no cervical or supraclavicular lymphadenopathy Cardiovascular: Normal rate, regular rhythm and normal heart sounds.   No murmur heard. No carotid bruit .  No edema Pulmonary/Chest: No respiratory distress. Diffuse wheeze with expiration.  No crackles.  Skin: Skin is warm and dry. Not diaphoretic.  Psychiatric: Normal mood and  affect. Behavior is normal.      Assessment & Plan:    See Problem List for Assessment and Plan of chronic medical problems.   FU in 6 months

## 2016-09-30 ENCOUNTER — Telehealth: Payer: Self-pay | Admitting: Emergency Medicine

## 2016-09-30 ENCOUNTER — Encounter: Payer: Self-pay | Admitting: Internal Medicine

## 2016-09-30 MED ORDER — LEVALBUTEROL TARTRATE 45 MCG/ACT IN AERO: 2.0000 | INHALATION_SPRAY | Freq: Four times a day (QID) | RESPIRATORY_TRACT | 0 refills | Status: DC | PRN

## 2016-09-30 NOTE — Telephone Encounter (Signed)
-----   Message from Pincus SanesStacy J Burns, MD sent at 09/29/2016  3:17 PM EST ----- Needs neb machine - asthma

## 2016-09-30 NOTE — Telephone Encounter (Signed)
Orders have been faxed to [facility] [employer] for neb machine

## 2016-10-02 ENCOUNTER — Other Ambulatory Visit: Payer: Self-pay | Admitting: Emergency Medicine

## 2016-10-02 MED ORDER — FLUTICASONE PROPIONATE 50 MCG/ACT NA SUSP: NASAL | 5 refills | Status: DC

## 2016-10-03 NOTE — Telephone Encounter (Signed)
Please advise 

## 2016-10-06 ENCOUNTER — Encounter: Payer: Self-pay | Admitting: Emergency Medicine

## 2016-10-06 NOTE — Telephone Encounter (Signed)
Letter faxed to pt's employer.

## 2016-10-07 ENCOUNTER — Other Ambulatory Visit: Payer: Self-pay | Admitting: Internal Medicine

## 2016-10-07 MED ORDER — HYDROCODONE-HOMATROPINE 5-1.5 MG/5ML PO SYRP: 5.0000 mL | ORAL_SOLUTION | Freq: Three times a day (TID) | ORAL | 0 refills | Status: DC | PRN

## 2016-10-07 NOTE — Telephone Encounter (Signed)
Ok to refill once

## 2016-10-08 ENCOUNTER — Other Ambulatory Visit: Payer: Self-pay | Admitting: Internal Medicine

## 2016-10-09 ENCOUNTER — Other Ambulatory Visit: Payer: Self-pay | Admitting: Internal Medicine

## 2016-10-10 NOTE — Telephone Encounter (Signed)
Already refilled today

## 2016-11-10 ENCOUNTER — Other Ambulatory Visit: Payer: Self-pay | Admitting: Internal Medicine

## 2016-11-10 MED ORDER — HYDROCODONE-HOMATROPINE 5-1.5 MG/5ML PO SYRP: 5.0000 mL | ORAL_SOLUTION | Freq: Three times a day (TID) | ORAL | 0 refills | Status: DC | PRN

## 2016-11-10 MED ORDER — AMPHETAMINE-DEXTROAMPHET ER 30 MG PO CP24: 30.0000 mg | ORAL_CAPSULE | ORAL | 0 refills | Status: DC

## 2016-11-10 NOTE — Telephone Encounter (Signed)
Please advise on refill as Dr Lawerance BachBurns is out of the office.

## 2016-12-11 ENCOUNTER — Other Ambulatory Visit: Payer: Self-pay | Admitting: Internal Medicine

## 2016-12-11 NOTE — Telephone Encounter (Signed)
Please advise on refill, has not been filled since 2016

## 2016-12-12 ENCOUNTER — Telehealth: Payer: Self-pay | Admitting: Emergency Medicine

## 2016-12-12 MED ORDER — ALPRAZOLAM 0.5 MG PO TABS: ORAL_TABLET | ORAL | 0 refills | Status: DC

## 2016-12-12 NOTE — Telephone Encounter (Signed)
Spoke with pt, he requested Xanax be sent to mail order

## 2016-12-12 NOTE — Telephone Encounter (Signed)
Ladona Ridgelaylor [facility]ed script to [facility]...Raechel Chute/lmb

## 2016-12-16 ENCOUNTER — Ambulatory Visit (INDEPENDENT_AMBULATORY_CARE_PROVIDER_SITE_OTHER): Payer: BC Managed Care – PPO | Admitting: Internal Medicine

## 2016-12-16 ENCOUNTER — Encounter: Payer: Self-pay | Admitting: Internal Medicine

## 2016-12-16 VITALS — BP 130/90 | HR 108 | Temp 98.6°F | Ht 66.5 in | Wt 173.0 lb

## 2016-12-16 DIAGNOSIS — F411 Generalized anxiety disorder: Secondary | ICD-10-CM | POA: Diagnosis not present

## 2016-12-16 DIAGNOSIS — F988 Other specified behavioral and emotional disorders with onset usually occurring in childhood and adolescence: Secondary | ICD-10-CM | POA: Diagnosis not present

## 2016-12-16 DIAGNOSIS — G43909 Migraine, unspecified, not intractable, without status migrainosus: Secondary | ICD-10-CM | POA: Diagnosis not present

## 2016-12-16 MED ORDER — INSULIN PEN NEEDLE 32G X 6 MM MISC: 1 refills | Status: DC

## 2016-12-16 MED ORDER — CLONAZEPAM 0.5 MG PO TABS: 0.5000 mg | ORAL_TABLET | Freq: Two times a day (BID) | ORAL | 3 refills | Status: DC

## 2016-12-16 MED ORDER — SUMATRIPTAN SUCCINATE 100 MG PO TABS: 100.0000 mg | ORAL_TABLET | ORAL | 3 refills | Status: DC | PRN

## 2016-12-16 NOTE — Patient Instructions (Addendum)
A letter was written for you.   Stop xanax and start clonazepam twice daily.  We will increase imitrex to 100 mg  - take as needed.

## 2016-12-16 NOTE — Assessment & Plan Note (Addendum)
Not ideally controlled Change xanax to clonazepam to see if that helps more continue sertraline at current does - consider increasing it in the future Would benefit from a emotional support animal - will write a letter so he can have it in his apartment

## 2016-12-16 NOTE — Assessment & Plan Note (Signed)
Controlled, stable Continue current dose of medication  

## 2016-12-16 NOTE — Progress Notes (Signed)
Pre visit review using our clinic review tool, if applicable. No additional management support is needed unless otherwise documented below in the visit note. 

## 2016-12-16 NOTE — Progress Notes (Signed)
Subjective:    Patient ID: Tony [name], male    DOB: March 16, 1976, 41 y.o.   MRN: [id number]  HPI The patient is here for follow up.  Anxiety:  He is taking sertraline daily as prescribed.  He takes xanax and it does not feel to be working as well.  He sometimes has to take two at the same time.  He has had increased anxiety/stress.  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   He has a cat and this helps with his stress/anxiety.  His school said a letter from me would allow him to have his cat in his apartment.   ADD:  He is taking his medication as prescribed.  He feels the medication is effective.  He denies side effects, including palpitations, headaches, lightheadedness, decreased appetite and weight loss.   migraines:  Related to his increase stress he has had increased migraines.  He takes the imitrex as needed and it worse but wonders if the nasal spray would be an option because it is quicker.  He does not like the aftertaste with the spray.    Medications and allergies reviewed with patient and updated if appropriate.  Patient Active Problem List   Diagnosis Date Noted  . Acute bronchitis 09/29/2016  . Mild intermittent asthma with acute exacerbation 09/29/2016  . Esophageal reflux 06/17/2016  . Nonallopathic lesion of cervical region 04/02/2016  . Nonallopathic lesion of thoracic region 04/02/2016  . Nonallopathic lesion-rib cage 04/02/2016  . Pain in right acromioclavicular joint 02/21/2016  . Bursitis of right shoulder 01/25/2016  . Chronic upper back pain 12/18/2015  . Major depressive disorder, recurrent episode, moderate (HCC) 05/31/2015  . Overweight   . Asthma, mild intermittent 03/31/2013  . Migraine   . Obstructive sleep apnea   . Generalized anxiety disorder   . Seasonal and perennial allergic rhinitis   . Essential hypertension   . ADD (attention deficit disorder)     Current Outpatient Prescriptions on File Prior to Visit  Medication Sig Dispense  Refill  . acyclovir ointment (ZOVIRAX) 5 % APPLY TOPICALLY EVERY 3 HOURS 15 g 0  . albuterol (PROVENTIL) (2.5 MG/3ML) 0.083% nebulizer solution Take 3 mLs (2.5 mg total) by nebulization every 6 (six) hours as needed for wheezing or shortness of breath. 75 mL 12  . ALPRAZolam (XANAX) 0.5 MG tablet TAKE 1 TABLET BY MOUTH TWICE DAILY AS NEEDED FOR ANXIETY OR SLEEP 60 tablet 0  . amphetamine-dextroamphetamine (ADDERALL XR) 30 MG 24 hr capsule Take 1 capsule (30 mg total) by mouth every morning. 30 capsule 0  . cetirizine (ZYRTEC) 10 MG tablet Take 1 tablet (10 mg total) by mouth daily. 90 tablet 1  . cyclobenzaprine (FLEXERIL) 5 MG tablet TAKE 1 TABLET 3 TIMES A DAYAS NEEDED FOR MUSCLE SPASMS 60 tablet 0  . Diclofenac Sodium (PENNSAID) 2 % SOLN Place 2 application onto the skin 2 (two) times daily. 112 g 3  . fluticasone (FLONASE) 50 MCG/ACT nasal spray USE TWO SPRAY IN NOSE ONCE DAILY 16 g 5  . hydrOXYzine (ATARAX/VISTARIL) 10 MG tablet Take 1 tablet (10 mg total) by mouth 3 (three) times daily as needed. 90 tablet 1  . indomethacin (INDOCIN) 50 MG capsule TAKE 1 CAPSULE 3 TIMES A   DAY AS NEEDED 270 capsule 0  . Insulin Pen Needle (NOVOFINE) 32G X 6 MM MISC Needles to be used with injecting rx for saxenda 100 each 1  . levalbuterol (XOPENEX HFA) 45  MCG/ACT inhaler Inhale 2 puffs into the lungs every 6 (six) hours as needed for wheezing. 3 Inhaler 0  . losartan-hydrochlorothiazide (HYZAAR) 50-12.5 MG per tablet Take 1 tablet by mouth daily. 90 tablet 3  . pantoprazole (PROTONIX) 40 MG tablet Take 1 tablet (40 mg total) by mouth daily. 90 tablet 3  . SAXENDA 18 MG/3ML SOPN INJECT 3MG  INTO SKIN DAILY 45 mL 3  . sertraline (ZOLOFT) 50 MG tablet TAKE 1 TABLET DAILY 90 tablet 1  . SUMAtriptan (IMITREX) 50 MG tablet TAKE 1 TABLET BY MOUTH EVERY 2 HOURS AS NEEDED FOR MIGRAINE *MAY REPEAT IN 2 HOURS IF HEADACHE PERSISTS OR RECURS* MAX 2 TABS/24 HOURS 12 tablet 3   No current facility-administered medications  on file prior to visit.     Past Medical History:  Diagnosis Date  . Allergic rhinitis   . Anxiety    overlap with depression and ADD  . Asthma   . Hepatitis A   . Hypertension   . Migraine   . Migraines   . OSA on CPAP    Sleep study 12/2011: severe, CPAP 10mmHg    Past Surgical History:  Procedure Laterality Date  . LASIK  2005  . WISDOM TOOTH EXTRACTION      Social History   Social History  . Marital status: Single    Spouse name: N/A  . Number of children: N/A  . Years of education: N/A   Occupational History  . 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 Smoking status: Former Smoker    Packs/day: 0.24    Years: 3.00    Types: Cigarettes    Quit date: 09/22/2008  . Smokeless tobacco: Not on file  . Alcohol use 0.0 oz/week     Comment: OCCASSIONAL  . Drug use: No  . Sexual activity: Not on file   Other Topics Concern  . 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 Hypertension Mother   . Arthritis Father   . Hypertension Father   . Lung cancer Maternal Grandfather   . Diabetes Other   . Emphysema Paternal Grandfather   . Asthma Paternal Grandfather     Review of Systems  Constitutional: Negative for fever.  Respiratory: Negative for cough, shortness of breath and wheezing.   Neurological: Positive for headaches.  Psychiatric/Behavioral: Negative for dysphoric mood. The patient is nervous/anxious.        Objective:   Vitals:   12/16/16 1405  BP: 130/90  Pulse: (!) 108  Temp: 98.6 F (37 C)   Wt Readings from Last 3 Encounters:  12/16/16 173 lb 0.6 oz (78.5 kg)  09/29/16 190 lb (86.2 kg)  06/17/16 180 lb (81.6 kg)   Body mass index is 27.51 kg/m.   Physical Exam  Constitutional: He appears well-developed and well-nourished. No distress.  Skin: He is not diaphoretic.    Psychiatric: He has a normal mood and affect. His behavior is normal. Judgment and thought content normal.         Assessment & Plan:    See Problem List for Assessment and Plan of chronic medical problems.

## 2016-12-16 NOTE — Assessment & Plan Note (Signed)
Increased migraines related to increased stress  imitrex nasal spray works well but he does not like the after taste Will try increasing imitresx to 100 mg prn - if not effective will try a different triptan

## 2016-12-16 NOTE — Addendum Note (Signed)
Addended by: Zenovia JordanMITCHELL, TAYLOR B on: 12/16/2016 03:31 PM   Modules accepted: Orders

## 2016-12-17 ENCOUNTER — Other Ambulatory Visit: Payer: Self-pay | Admitting: Emergency Medicine

## 2016-12-17 MED ORDER — INSULIN PEN NEEDLE 32G X 6 MM MISC: 1 refills | Status: DC

## 2016-12-23 ENCOUNTER — Other Ambulatory Visit: Payer: Self-pay | Admitting: Family

## 2016-12-24 MED ORDER — AMPHETAMINE-DEXTROAMPHET ER 30 MG PO CP24: 30.0000 mg | ORAL_CAPSULE | ORAL | 0 refills | Status: DC

## 2016-12-24 NOTE — Telephone Encounter (Signed)
[place] controlled substance database checked.  Ok to fill medication. rx printed 

## 2016-12-29 ENCOUNTER — Other Ambulatory Visit: Payer: Self-pay

## 2016-12-29 MED ORDER — HYDROXYZINE HCL 10 MG PO TABS: 10.0000 mg | ORAL_TABLET | Freq: Three times a day (TID) | ORAL | 0 refills | Status: DC | PRN

## 2016-12-31 ENCOUNTER — Telehealth: Payer: Self-pay | Admitting: Emergency Medicine

## 2016-12-31 NOTE — Telephone Encounter (Signed)
PA information sheet for Tony [name] has been faxed to [facility]. Awaiting response.

## 2017-01-01 ENCOUNTER — Other Ambulatory Visit: Payer: Self-pay | Admitting: Nurse Practitioner

## 2017-01-01 DIAGNOSIS — L03115 Cellulitis of right lower limb: Secondary | ICD-10-CM

## 2017-01-02 ENCOUNTER — Ambulatory Visit (INDEPENDENT_AMBULATORY_CARE_PROVIDER_SITE_OTHER): Payer: BC Managed Care – PPO | Admitting: Family Medicine

## 2017-01-02 ENCOUNTER — Encounter: Payer: Self-pay | Admitting: Family Medicine

## 2017-01-02 VITALS — BP 130/90 | HR 97 | Temp 98.3°F | Wt 173.6 lb

## 2017-01-02 DIAGNOSIS — L03115 Cellulitis of right lower limb: Secondary | ICD-10-CM

## 2017-01-02 NOTE — Progress Notes (Signed)
Pre visit review using our clinic review tool, if applicable. No additional management support is needed unless otherwise documented below in the visit note. 

## 2017-01-02 NOTE — Progress Notes (Signed)
Subjective:     Patient ID: Tony [name], male   DOB: April 05, 1976, 41 y.o.   MRN: [id number]  HPI Patient seen with right leg pain and swelling. He states 2 weeks ago was doing some work at [employer] and apparently an old 2 X 4 hit against his right calf. He had small splinter which he removed. There was minimal bleeding. He's noticed some increased swelling and warmth to touch since then but no drainage. No fevers or chills. [facility]. He was given prescription for doxycycline 100 mg twice daily which he has not yet started on. They ordered ultrasound of the right lower extremity to further assess to rule out foreign body. Minimal pain with ambulation.  Past Medical History:  Diagnosis Date  . Allergic rhinitis   . Anxiety    overlap with depression and ADD  . Asthma   . Hepatitis A   . Hypertension   . Migraine   . Migraines   . OSA on CPAP    Sleep study 12/2011: severe, CPAP   Past Surgical History:  Procedure Laterality Date  . LASIK  2005  . WISDOM TOOTH EXTRACTION      reports that he quit smoking about 8 years ago. His smoking use included Cigarettes. He has a 0.72 pack-year smoking history. He has never used smokeless tobacco. He reports that he drinks alcohol. He reports that he does not use drugs. family history includes Arthritis in his father; Asthma in his paternal grandfather; Diabetes in his other; Emphysema in his paternal grandfather; Hypertension in his father and mother; Lung cancer in his maternal grandfather. Allergies  Allergen Reactions  . Erythromycin     Pt states med make him sick     Review of Systems  Constitutional: Negative for chills, fatigue and fever.  Respiratory: Negative for shortness of breath.   Skin: Negative for rash.  Hematological: Negative for adenopathy.       Objective:   Physical Exam  Constitutional: He appears well-developed and well-nourished.   Cardiovascular: Normal rate and regular rhythm.   Pulmonary/Chest: Effort normal and breath sounds normal. No respiratory distress. He has no wheezes. He has no rales.  Skin:  Right lower extremity along the mid medial calf he has area of induration which is over 3 cm in length and about 2 cm wide. He has small open wound along one portion. There is no drainage. Mild erythema just superior to the wound. No necrosis. No foul odor. Minimally tender to palpation. No fluctuance. No definite foreign body palpated but he does have large area of induration as noted above       Assessment:     Small wound right calf with large area of induration and probably some mild cellulitis changes. Cannot rule out foreign body -though none palpated    Plan:     -Strongly recommended he start the doxycycline 100 mg twice daily -We recommended warm compresses 2-3 times daily -We agreed with going ahead with imaging to help further assess. -We elected not to perform incision and drainage given the fact that he has no fluctuance, no palpated foreign body, and very large area of induration which makes difficult decision where to open.  Kristian Covey MD [facility]

## 2017-01-02 NOTE — Patient Instructions (Signed)
Go ahead and start the Doxycycline Recommend warm compresses to leg several times daily Follow up immediately for any fever or increased redness.

## 2017-01-08 ENCOUNTER — Encounter: Payer: Self-pay | Admitting: Internal Medicine

## 2017-01-08 ENCOUNTER — Encounter: Payer: Self-pay | Admitting: Family Medicine

## 2017-01-08 ENCOUNTER — Ambulatory Visit (INDEPENDENT_AMBULATORY_CARE_PROVIDER_SITE_OTHER): Payer: BC Managed Care – PPO | Admitting: Family Medicine

## 2017-01-08 ENCOUNTER — Ambulatory Visit
Admission: RE | Admit: 2017-01-08 | Discharge: 2017-01-08 | Disposition: A | Discharge status: Home / Self Care | Payer: BC Managed Care – PPO | Source: Ambulatory Visit | Attending: Nurse Practitioner | Admitting: Nurse Practitioner

## 2017-01-08 VITALS — BP 110/70 | HR 78 | Temp 98.0°F | Ht 66.5 in | Wt 175.0 lb

## 2017-01-08 DIAGNOSIS — L0390 Cellulitis, unspecified: Secondary | ICD-10-CM | POA: Diagnosis not present

## 2017-01-08 DIAGNOSIS — L03115 Cellulitis of right lower limb: Secondary | ICD-10-CM

## 2017-01-08 DIAGNOSIS — K130 Diseases of lips: Secondary | ICD-10-CM

## 2017-01-08 DIAGNOSIS — B370 Candidal stomatitis: Secondary | ICD-10-CM

## 2017-01-08 DIAGNOSIS — L559 Sunburn, unspecified: Secondary | ICD-10-CM

## 2017-01-08 MED ORDER — FLUCONAZOLE 150 MG PO TABS: 150.0000 mg | ORAL_TABLET | Freq: Every day | ORAL | 0 refills | Status: DC

## 2017-01-08 NOTE — Telephone Encounter (Signed)
Please advise if okay to send in 100 mg

## 2017-01-08 NOTE — Progress Notes (Signed)
Pre visit review using our clinic review tool, if applicable. No additional management support is needed unless otherwise documented below in the visit note. 

## 2017-01-08 NOTE — Patient Instructions (Signed)
Take the medication for the yeast as prescribed.  Can use a small amount of hydrocortisone cream mixed with clotrimazole cream 1-2 times daily in corners of mouth for 1-2 weeks if needed. These are both available over the counter.  Stay out of the sun when on doxycycline.  See your doctor in abut 1-2 weeks for follow up, sooner if worsening, new concerns or not improving.   I hope you feel better soon!

## 2017-01-08 NOTE — Progress Notes (Signed)
HPI:  Acute visit for ? Thrush: -started a few days ago, a few days after starting doxy for ? Skin infection in his R leg -symptoms include: cracking and white skin corners of mouth, and ? Thrush in cheeks - feels like buccal mucosa if swollen, dry mouth -has been in the sun a lot and has sunburn on face and neck -no fevers, dysphagia, SOB, difficulty breathing, rash elsewhere -wound is healing and is without redness, drainage; no fevers -he has seen several doctors for the leg, has [place] today and on doxy x7 days - the doxy upsets his stomach, has three more doses  ROS: See pertinent positives and negatives per HPI.  Past Medical History:  Diagnosis Date  . Allergic rhinitis   . Anxiety    overlap with depression and ADD  . Asthma   . Hepatitis A   . Hypertension   . Migraine   . Migraines   . OSA on CPAP    Sleep study 12/2011: severe, CPAP    Past Surgical History:  Procedure Laterality Date  . LASIK  2005  . WISDOM TOOTH EXTRACTION      Family History  Problem Relation Age of Onset  . Hypertension Mother   . Arthritis Father   . Hypertension Father   . Lung cancer Maternal Grandfather   . Diabetes Other   . Emphysema Paternal Grandfather   . Asthma Paternal Grandfather     Social History   Social History  . Marital status: Single    Spouse name: N/A  . Number of children: N/A  . Years of education: N/A   Occupational History  . 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 Smoking status: Former Smoker    Packs/day: 0.24    Years: 3.00    Types: Cigarettes    Quit date: 09/22/2008  . Smokeless tobacco: Never Used  . Alcohol use 0.0 oz/week     Comment: OCCASSIONAL  . Drug use: No  . Sexual activity: Not Asked   Other Topics Concern  . 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  acyclovir ointment  (ZOVIRAX) 5 %, APPLY TOPICALLY EVERY 3 HOURS, Disp: 15 g, Rfl: 0 .  albuterol (PROVENTIL) (2.5 MG/3ML) 0.083% nebulizer solution, Take 3 mLs (2.5 mg total) by nebulization every 6 (six) hours as needed for wheezing or shortness of breath., Disp: 75 mL, Rfl: 12 .  amphetamine-dextroamphetamine (ADDERALL XR) 30 MG 24 hr capsule, Take 1 capsule (30 mg total) by mouth every morning., Disp: 30 capsule, Rfl: 0 .  cetirizine (ZYRTEC) 10 MG tablet, Take 1 tablet (10 mg total) by mouth daily., Disp: 90 tablet, Rfl: 1 .  clonazePAM (KLONOPIN) 0.5 MG tablet, Take 1 tablet (0.5 mg total) by mouth 2 (two) times daily., Disp: 60 tablet, Rfl: 3 .  cyclobenzaprine (FLEXERIL) 5 MG tablet, TAKE 1 TABLET 3 TIMES A DAYAS NEEDED FOR MUSCLE SPASMS, Disp: 60 tablet, Rfl: 0 .  Diclofenac Sodium (PENNSAID) 2 % SOLN, Place 2 application onto the skin 2 (two) times daily., Disp: 112 g, Rfl: 3 .  fluticasone (FLONASE) 50 MCG/ACT nasal spray, USE TWO SPRAY IN NOSE ONCE DAILY, Disp: 16 g, Rfl: 5 .  hydrOXYzine (ATARAX/VISTARIL) 10 MG tablet, Take 1 tablet (10 mg total) by mouth 3 (three) times daily as needed., Disp: 90 tablet,  Rfl: 0 .  indomethacin (INDOCIN) 50 MG capsule, TAKE 1 CAPSULE 3 TIMES A   DAY AS NEEDED, Disp: 270 capsule, Rfl: 0 .  Insulin Pen Needle (BD ULTRA-FINE MICRO PEN NEEDLE) 32G X 6 MM MISC, Use with Saxenda, Disp: 100 each, Rfl: 1 .  levalbuterol (XOPENEX HFA) 45 MCG/ACT inhaler, Inhale 2 puffs into the lungs every 6 (six) hours as needed for wheezing., Disp: 3 Inhaler, Rfl: 0 .  losartan-hydrochlorothiazide (HYZAAR) 50-12.5 MG per tablet, Take 1 tablet by mouth daily., Disp: 90 tablet, Rfl: 3 .  pantoprazole (PROTONIX) 40 MG tablet, Take 1 tablet (40 mg total) by mouth daily., Disp: 90 tablet, Rfl: 3 .  SAXENDA 18 MG/3ML SOPN, INJECT  INTO SKIN DAILY, Disp: 45 mL, Rfl: 3 .  sertraline (ZOLOFT) 50 MG tablet, TAKE 1 TABLET DAILY, Disp: 90 tablet, Rfl: 1 .  SUMAtriptan (IMITREX) 100 MG tablet, Take 1 tablet  (100 mg total) by mouth every 2 (two) hours as needed for migraine. May repeat in 2 hours if headache persists or recurs., Disp: 30 tablet, Rfl: 3 .  fluconazole (DIFLUCAN) 150 MG tablet, Take 1 tablet (150 mg total) by mouth daily. Can repeat in 3 days if needed., Disp: 2 tablet, Rfl: 0  EXAM:  Vitals:   01/08/17 1328  BP: 110/70  Pulse: 78  Temp: 98 F (36.7 C)    Body mass index is 27.82 kg/m.  GENERAL: vitals reviewed and listed above, alert, oriented, appears well hydrated and in no acute distress  HEENT: atraumatic, conjunttiva clear, no obvious abnormalities on inspection of external nose and ears, mild cracking of corners of mouth, mild white plaque formation buccal mucosa bilat with mild irritation skin, no swelling of lips or tongue  SKIN: mild sunburn on face/neck sun exposed areas without peeling or blistering, healed small lesion/scab with sm sub cut nodule R lower leg - no erythema, drainage or warmth  NECK: no obvious masses on inspection  LUNGS: clear to auscultation bilaterally, no wheezes, rales or rhonchi, good air movement  CV: HRRR, no peripheral edema  MS: moves all extremities without noticeable abnormality  PSYCH: pleasant and cooperative, no obvious depression or anxiety  ASSESSMENT AND PLAN:  Discussed the following assessment and plan:  Oral yeast infection  Cellulitis, unspecified cellulitis site  Sunburn  Angular cheilitis  -oral complaints most likely yeast related - will tx with topical OTC options per below and diflucan -also discussed probiotic options -wound has no signs of infection today and he plans to try skipping the last few doses of doxy - reasonable -OTC options for mild sunburn, advised of increased risks sunburn on doxy -advised f/u with PCP in 1-2 weeks regarding all of these issues -Patient advised to return or notify a doctor  sooner if symptoms worsen or new concerns arise.  Patient Instructions  Take the medication  for the yeast as prescribed.  Can use a small amount of hydrocortisone cream mixed with clotrimazole cream 1-2 times daily in corners of mouth for 1-2 weeks if needed. These are both available over the counter.  Stay out of the sun when on doxycycline.  See your doctor in abut 1-2 weeks for follow up, sooner if worsening, new concerns or not improving.   I hope you feel better soon!     Kriste Basque R., DO

## 2017-01-09 MED ORDER — SERTRALINE HCL 100 MG PO TABS
100.0000 mg | ORAL_TABLET | Freq: Every day | ORAL | 1 refills | Status: DC
Start: 2017-01-09 — End: 2017-03-30

## 2017-01-17 ENCOUNTER — Other Ambulatory Visit: Payer: Self-pay | Admitting: Family Medicine

## 2017-01-21 ENCOUNTER — Ambulatory Visit (INDEPENDENT_AMBULATORY_CARE_PROVIDER_SITE_OTHER): Payer: BC Managed Care – PPO | Admitting: Internal Medicine

## 2017-01-21 ENCOUNTER — Encounter: Payer: Self-pay | Admitting: Internal Medicine

## 2017-01-21 VITALS — BP 136/80 | HR 105 | Temp 98.2°F | Resp 16 | Wt 174.0 lb

## 2017-01-21 DIAGNOSIS — S81801D Unspecified open wound, right lower leg, subsequent encounter: Secondary | ICD-10-CM

## 2017-01-21 MED ORDER — CLONAZEPAM 0.5 MG PO TABS: 0.5000 mg | ORAL_TABLET | Freq: Two times a day (BID) | ORAL | 0 refills | Status: DC

## 2017-01-21 MED ORDER — MUPIROCIN 2 % EX OINT: 1.0000 "application " | TOPICAL_OINTMENT | Freq: Two times a day (BID) | CUTANEOUS | 0 refills | Status: DC

## 2017-01-21 NOTE — Progress Notes (Signed)
Subjective:    Patient ID: Tony [name], male    DOB: 1976-04-17, 41 y.o.   MRN: [id number]  HPI He is here for an acute visit.   The end of march he was doing yard work and he had an old rotted dog house and he was taking it down.  A piece of wood went in to this right lower leg.  Two days later the area was red, warm and tender.  He took out the wood with tweezers and assumed he got all the wood. It was not healing right.  He felt knots around were the wood was.  [facility]  She was put on doxycycline 100 mg BID x 7 days.  He had a RLE [place].  He vomited and developed a yeast infection and ws treated by Dr Dahlia Client.   He took a blade and sliced into this leg and push on his leg.  He had some pus and blood clots.  The leg is still not looking right.  He denies pain.  There is still some redness.  It is still somewhat open.    There is still some lumps.     [place] RLE 01/08/17: IMPRESSION: No evidence for focal mass lesion or abscess in the area of patient concern. MRI without and with contrast would be a more sensitive means to evaluate, as clinically warranted.  Td up to date.  Medications and allergies reviewed with patient and updated if appropriate.  Patient Active Problem List   Diagnosis Date Noted  . Acute bronchitis 09/29/2016  . Mild intermittent asthma with acute exacerbation 09/29/2016  . Esophageal reflux 06/17/2016  . Nonallopathic lesion of cervical region 04/02/2016  . Nonallopathic lesion of thoracic region 04/02/2016  . Nonallopathic lesion-rib cage 04/02/2016  . Pain in right acromioclavicular joint 02/21/2016  . Bursitis of right shoulder 01/25/2016  . Chronic upper back pain 12/18/2015  . Major depressive disorder, recurrent episode, moderate (HCC) 05/31/2015  . Overweight   . Asthma, mild intermittent 03/31/2013  . Migraine   . Obstructive sleep apnea   . Generalized anxiety disorder   . Seasonal and perennial allergic rhinitis   .  Essential hypertension   . ADD (attention deficit disorder)     Current Outpatient Prescriptions on File Prior to Visit  Medication Sig Dispense Refill  . acyclovir ointment (ZOVIRAX) 5 % APPLY TOPICALLY EVERY 3 HOURS 15 g 0  . albuterol (PROVENTIL) (2.5 MG/3ML) 0.083% nebulizer solution Take 3 mLs (2.5 mg total) by nebulization every 6 (six) hours as needed for wheezing or shortness of breath. 75 mL 12  . amphetamine-dextroamphetamine (ADDERALL XR) 30 MG 24 hr capsule Take 1 capsule (30 mg total) by mouth every morning. 30 capsule 0  . cetirizine (ZYRTEC) 10 MG tablet Take 1 tablet (10 mg total) by mouth daily. 90 tablet 1  . clonazePAM (KLONOPIN) 0.5 MG tablet Take 1 tablet (0.5 mg total) by mouth 2 (two) times daily. 60 tablet 3  . cyclobenzaprine (FLEXERIL) 5 MG tablet TAKE 1 TABLET 3 TIMES A DAYAS NEEDED FOR MUSCLE SPASMS 60 tablet 0  . Diclofenac Sodium (PENNSAID) 2 % SOLN Place 2 application onto the skin 2 (two) times daily. 112 g 3  . fluconazole (DIFLUCAN) 150 MG tablet Take 1 tablet (150 mg total) by mouth daily. Can repeat in 3 days if needed. 2 tablet 0  . fluticasone (FLONASE) 50 MCG/ACT nasal spray USE TWO SPRAY IN NOSE ONCE DAILY  16 g 5  . hydrOXYzine (ATARAX/VISTARIL) 10 MG tablet TAKE 1 TABLET 3 TIMES A DAYAS NEEDED 90 tablet 0  . indomethacin (INDOCIN) 50 MG capsule TAKE 1 CAPSULE 3 TIMES A   DAY AS NEEDED 270 capsule 0  . Insulin Pen Needle (BD ULTRA-FINE MICRO PEN NEEDLE) 32G X 6 MM MISC Use with Saxenda 100 each 1  . levalbuterol (XOPENEX HFA) 45 MCG/ACT inhaler Inhale 2 puffs into the lungs every 6 (six) hours as needed for wheezing. 3 Inhaler 0  . losartan-hydrochlorothiazide (HYZAAR) 50-12.5 MG per tablet Take 1 tablet by mouth daily. 90 tablet 3  . pantoprazole (PROTONIX) 40 MG tablet Take 1 tablet (40 mg total) by mouth daily. 90 tablet 3  . SAXENDA 18 MG/3ML SOPN INJECT  INTO SKIN DAILY 45 mL 3  . sertraline (ZOLOFT) 100 MG tablet Take 1 tablet (100 mg total) by  mouth daily. 90 tablet 1  . SUMAtriptan (IMITREX) 100 MG tablet Take 1 tablet (100 mg total) by mouth every 2 (two) hours as needed for migraine. May repeat in 2 hours if headache persists or recurs. 30 tablet 3   No current facility-administered medications on file prior to visit.     Past Medical History:  Diagnosis Date  . Allergic rhinitis   . Anxiety    overlap with depression and ADD  . Asthma   . Hepatitis A   . Hypertension   . Migraine   . Migraines   . OSA on CPAP    Sleep study 12/2011: severe, CPAP    Past Surgical History:  Procedure Laterality Date  . LASIK  2005  . WISDOM TOOTH EXTRACTION      Social History   Social History  . Marital status: Single    Spouse name: N/A  . Number of children: N/A  . Years of education: N/A   Occupational History  . 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 Smoking status: Former Smoker    Packs/day: 0.24    Years: 3.00    Types: Cigarettes    Quit date: 09/22/2008  . Smokeless tobacco: Never Used  . Alcohol use 0.0 oz/week     Comment: OCCASSIONAL  . Drug use: No  . Sexual activity: Not on file   Other Topics Concern  . 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 Hypertension Mother   . Arthritis Father   . Hypertension Father   . Lung cancer Maternal Grandfather   . Diabetes Other   . Emphysema Paternal Grandfather   . Asthma Paternal Grandfather     Review of Systems  Constitutional: Negative for fever.  Skin: Positive for color change (redness) and wound (with discharge last night).  Neurological: Negative for light-headedness and headaches.       Objective:   Vitals:   01/21/17 1343  BP: 136/80  Pulse: (!) 105  Resp: 16  Temp: 98.2 F (36.8 C)   Filed Weights   01/21/17 1343  Weight: 174 lb (78.9 kg)   Body mass index is  27.66 kg/m.  Wt Readings from Last 3 Encounters:  01/21/17 174 lb (78.9 kg)  01/08/17 175 lb (79.4 kg)  01/02/17 173 lb 9.6 oz (78.7 kg)     Physical Exam  Constitutional: He appears well-developed and well-nourished. No  distress.  Neurological:  Normal sensation RLE  Skin: Skin is warm and dry. He is not diaphoretic. There is erythema (minimal surrounding wound).  Open laceration on right lower anterior-medial leg about 1/2 inch in length.  No discharge.  Mild induration surrounding, no tenderness.  Small < pea sized lump next to laceration          Assessment & Plan:   See Problem List for Assessment and Plan of chronic medical problems.

## 2017-01-21 NOTE — Assessment & Plan Note (Addendum)
No evidence of active infection Given open wound will start bactroban topically - monitor for signs of infection No discomfort and no obvious foreign body therefore will not pursue MRI He will let me know if this does not continue to heal correctly, in which case an MRI may be necessary

## 2017-01-21 NOTE — Patient Instructions (Addendum)
Start applying Bactroban ointment to your leg wound.   Monitor for signs of infection and call if you have any.   If the wound does not heal correctly or you feel there is still a piece of wood in your leg please let me know.

## 2017-01-21 NOTE — Progress Notes (Signed)
Pre visit review using our clinic review tool, if applicable. No additional management support is needed unless otherwise documented below in the visit note. 

## 2017-01-26 ENCOUNTER — Other Ambulatory Visit: Payer: Self-pay | Admitting: Internal Medicine

## 2017-01-26 NOTE — Telephone Encounter (Signed)
Ok to refill 

## 2017-01-27 MED ORDER — AMPHETAMINE-DEXTROAMPHET ER 30 MG PO CP24: 30.0000 mg | ORAL_CAPSULE | ORAL | 0 refills | Status: DC

## 2017-01-27 NOTE — Telephone Encounter (Signed)
Printed script, MD signed, sent pt mychart msg letting his know rx is ready for pick-up...Raechel Chute/lmb

## 2017-02-07 ENCOUNTER — Encounter: Payer: Self-pay | Admitting: Internal Medicine

## 2017-02-09 ENCOUNTER — Telehealth: Payer: Self-pay | Admitting: *Deleted

## 2017-02-09 MED ORDER — NYSTATIN 100000 UNIT/ML MT SUSP: 5.0000 mL | Freq: Four times a day (QID) | OROMUCOSAL | 0 refills | Status: DC

## 2017-02-09 NOTE — Telephone Encounter (Signed)
Called pharmacist spoke w/Micah gave her MD response. She states 60 ml will only be for 3 days will change to 200 ml and that would last for 10 day. Updated rx w/correct ml...Raechel Chute/lmb

## 2017-02-09 NOTE — Telephone Encounter (Signed)
Pharmacist Everlena Cooper(Mica) left msg on triage stating needing to verify if rx Nystatin sol is only to take for 3 days. Normal process is longer than 3 days...Raechel Chute/lmb

## 2017-02-09 NOTE — Telephone Encounter (Signed)
Yes needs 10 days worth

## 2017-02-09 NOTE — Addendum Note (Signed)
Addended by: Pincus SanesBURNS, Gal J on: 02/09/2017 12:45 PM   Modules accepted: Orders

## 2017-02-14 MED ORDER — FLUCONAZOLE 150 MG PO TABS: 150.0000 mg | ORAL_TABLET | Freq: Every day | ORAL | 0 refills | Status: DC

## 2017-02-14 NOTE — Addendum Note (Signed)
Addended by: Pincus SanesBURNS, Bernard J on: 02/14/2017 01:07 PM   Modules accepted: Orders

## 2017-02-16 ENCOUNTER — Other Ambulatory Visit: Payer: Self-pay | Admitting: Family Medicine

## 2017-02-17 NOTE — Telephone Encounter (Signed)
Refill done.  

## 2017-02-27 ENCOUNTER — Other Ambulatory Visit: Payer: Self-pay | Admitting: Internal Medicine

## 2017-02-28 MED ORDER — AMPHETAMINE-DEXTROAMPHET ER 30 MG PO CP24: 30.0000 mg | ORAL_CAPSULE | ORAL | 0 refills | Status: DC

## 2017-02-28 NOTE — Telephone Encounter (Signed)
[place] controlled substance database checked.  Ok to fill medication. printed 

## 2017-03-04 ENCOUNTER — Encounter: Payer: Self-pay | Admitting: Internal Medicine

## 2017-03-18 ENCOUNTER — Other Ambulatory Visit: Payer: Self-pay | Admitting: Family Medicine

## 2017-03-19 ENCOUNTER — Encounter: Payer: Self-pay | Admitting: Internal Medicine

## 2017-03-19 ENCOUNTER — Other Ambulatory Visit: Payer: Self-pay | Admitting: Internal Medicine

## 2017-03-19 NOTE — Telephone Encounter (Signed)
Refill done.  

## 2017-03-20 ENCOUNTER — Other Ambulatory Visit: Payer: Self-pay | Admitting: Internal Medicine

## 2017-03-20 MED ORDER — CYCLOBENZAPRINE HCL 5 MG PO TABS: 5.0000 mg | ORAL_TABLET | Freq: Three times a day (TID) | ORAL | 0 refills | Status: DC | PRN

## 2017-03-29 ENCOUNTER — Encounter: Payer: Self-pay | Admitting: Internal Medicine

## 2017-03-30 MED ORDER — LOSARTAN POTASSIUM-HCTZ 50-12.5 MG PO TABS: 1.0000 | ORAL_TABLET | Freq: Every day | ORAL | 0 refills | Status: DC

## 2017-03-30 MED ORDER — SERTRALINE HCL 100 MG PO TABS: 100.0000 mg | ORAL_TABLET | Freq: Every day | ORAL | 0 refills | Status: DC

## 2017-04-17 ENCOUNTER — Other Ambulatory Visit: Payer: Self-pay | Admitting: Family Medicine

## 2017-04-17 NOTE — Telephone Encounter (Signed)
Refill done.  

## 2017-04-20 ENCOUNTER — Other Ambulatory Visit: Payer: Self-pay | Admitting: Internal Medicine

## 2017-04-22 MED ORDER — AMPHETAMINE-DEXTROAMPHET ER 30 MG PO CP24: 30.0000 mg | ORAL_CAPSULE | ORAL | 0 refills | Status: DC

## 2017-04-22 NOTE — Telephone Encounter (Signed)
Pt notified via MyCHart. RX placed upfront for pick-up

## 2017-04-22 NOTE — Telephone Encounter (Signed)
[place] controlled substance database checked.  Ok to fill medication. printed 

## 2017-04-23 ENCOUNTER — Ambulatory Visit (INDEPENDENT_AMBULATORY_CARE_PROVIDER_SITE_OTHER): Payer: BC Managed Care – PPO | Admitting: Family Medicine

## 2017-04-23 ENCOUNTER — Encounter: Payer: Self-pay | Admitting: Family Medicine

## 2017-04-23 ENCOUNTER — Telehealth: Payer: Self-pay | Admitting: Family Medicine

## 2017-04-23 VITALS — BP 130/86 | HR 78 | Ht 66.5 in | Wt 178.0 lb

## 2017-04-23 DIAGNOSIS — M999 Biomechanical lesion, unspecified: Secondary | ICD-10-CM

## 2017-04-23 MED ORDER — TIZANIDINE HCL 4 MG PO TABS: 4.0000 mg | ORAL_TABLET | Freq: Every evening | ORAL | 2 refills | Status: DC

## 2017-04-23 NOTE — Progress Notes (Addendum)
Tony ScaleZach Gurtaj Ruz D.O. [facility] [address] [place], [place] [address] Phone: [phone] Subjective:    I'm seeing this patient by the request  of:    CC: back pain  MVH:[id number]:Subjective  Tony MustardStacy J [name] is a 41 y.o. male coming in with complaint of  R  We have also seems patient previously for chronic upper back pain. Also migraines. Has responded well to osteopathic manipulation. Patient is having some dull, throbbing aching pain. Patient states that there is some increasing tightness. Not responding to the muscle relaxer at this time.    Past Medical History:  Diagnosis Date  . Allergic rhinitis   . Anxiety    overlap with depression and ADD  . Asthma   . Hepatitis A   . Hypertension   . Migraine   . Migraines   . OSA on CPAP    Sleep study 12/2011: severe, CPAP 10mmHg   Past Surgical History:  Procedure Laterality Date  . LASIK  2005  . WISDOM TOOTH EXTRACTION     Social History   Social History  . Marital status: Single    Spouse name: N/A  . Number of children: N/A  . Years of education: N/A   Occupational History  . 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 Smoking status: Former Smoker    Packs/day: 0.24    Years: 3.00    Types: Cigarettes    Quit date: 09/22/2008  . Smokeless tobacco: Never Used  . Alcohol use 0.0 oz/week     Comment: OCCASSIONAL  . Drug use: No  . Sexual activity: Not Asked   Other Topics Concern  . 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 Erythromycin     Pt states med make him sick   Family History  Problem Relation Age of Onset  . Hypertension Mother   . Arthritis Father   . Hypertension Father   . Lung cancer Maternal Grandfather   . Diabetes Other   . Emphysema Paternal Grandfather   . Asthma Paternal Grandfather      Past medical history, social, surgical and family  history all reviewed in electronic medical record.  No pertanent information unless stated regarding to the chief complaint.   Review of Systems:Review of systems updated and as accurate as of 04/23/17  No headache, visual changes, nausea, vomiting, diarrhea, constipation, dizziness, abdominal pain, skin rash, fevers, chills, night sweats, weight loss, swollen lymph nodes, body aches, joint swelling,  chest pain, shortness of breath, mood changes. Positive muscle aches  Objective  Blood pressure 130/86, pulse 78, height 5' 6.5" (1.689 m), weight 178 lb (80.7 kg), SpO2 99 %. Systems examined below as of 04/23/17   General: No apparent distress alert and oriented x3 mood and affect normal, dressed appropriately.  HEENT: Pupils equal, extraocular movements intact  Respiratory: Patient's speak in full sentences and does not appear short of breath  Cardiovascular: No lower extremity edema, non tender, no erythema  Skin: Warm dry intact with no signs of infection or rash on extremities or on axial skeleton.  Abdomen: Soft nontender  Neuro: Cranial nerves II through XII are intact, neurovascularly intact in all extremities with 2+ DTRs and 2+ pulses.  Lymph: No lymphadenopathy of posterior or anterior cervical chain or axillae bilaterally.  Gait normal with good balance and coordination.  MSK:  Non tender with full range of motion and good stability and symmetric strength and tone of  elbows, wrist, hip, knee and ankles bilaterally.  Neck: Inspection unremarkable. No palpable stepoffs. Negative Spurling's maneuver. Tightness with lacking the last 10 of rotation bilaterally Grip strength and sensation normal in bilateral hands Strength good C4 to T1 distribution No sensory change to C4 to T1 Negative Hoffman sign bilaterally Reflexes normal    Osteopathic findings Cervical C2 flexed rotated and side bent right C4 flexed rotated and side bent left C6 flexed rotated and side bent left T3  extended rotated and side bent right inhaled third rib T9 extended rotated and side bent left   Impression and Recommendations:     This case required medical decision making of moderate complexity.      Note: This dictation was prepared with Dragon dictation along with smaller phrase technology. Any transcriptional errors that result from this process are unintentional.

## 2017-04-23 NOTE — Assessment & Plan Note (Deleted)
Patient given home exercises. Early frozen shoulder. Discussed avoiding certain that degrees. We discussed the importance of vitamin D. Patient will start to increase activity as tolerated. Patient will come back and see me again in 4-6 weeks.

## 2017-04-23 NOTE — Telephone Encounter (Signed)
Patient called stating he has pulled something in his back.  States that he has scheduled an appt online for Monday but patient is requesting something sooner b/c of pain.  Patient would like to know if he can be worked in Engineer, technical salestoday or tomorrow.

## 2017-04-23 NOTE — Telephone Encounter (Signed)
Pt scheduled at 11 am today

## 2017-04-23 NOTE — Assessment & Plan Note (Signed)
Decision today to treat with OMT was based on Physical Exam  After verbal consent patient was treated with HVLA, ME, FPR techniques in cervical, thoracic, rib, lumbar and sacral areas  Patient tolerated the procedure well with improvement in symptoms  Patient given exercises, stretches and lifestyle modifications  See medications in patient instructions if given  Patient will follow up in 4-6 weeks 

## 2017-04-23 NOTE — Patient Instructions (Addendum)
Good to see you  Ice 20 minutes every 4 hours for next 24 hours.  Zanaflex at night If not improved by tomorrow start Duexis 3 times a day for 3 days.  Start the exercises again on Monday  See em again in 2-3 weeks.

## 2017-04-27 ENCOUNTER — Ambulatory Visit: Payer: Self-pay | Admitting: Family Medicine

## 2017-04-27 ENCOUNTER — Encounter: Payer: Self-pay | Admitting: Family Medicine

## 2017-04-28 MED ORDER — IBUPROFEN-FAMOTIDINE 800-26.6 MG PO TABS: ORAL_TABLET | ORAL | 1 refills | Status: DC

## 2017-05-01 ENCOUNTER — Encounter: Payer: Self-pay | Admitting: Family Medicine

## 2017-05-06 NOTE — Progress Notes (Signed)
[name] [address] [place], [place] [address] Phone: [phone] Subjective:    I'm seeing this patient by the request  of:    CC: back pain, Frozen shoulder  BJY:[id number]  Tony [name] is a 41 y.o. male coming in with complaint of    Has seen patient for upper back pain previously. Has responded fairly well to osteopathic manipulation previously. Patient had been doing home exercises, posture, ergonomics. Patient states that he did make some improvement but now is having worsening symptoms again. Has not been doing any heavy lifting. States that the migraines still seemed to be somewhat uncomfortable.  Patient was having worsening right shoulder pain and was found to have more of a frozen shoulder. We injected him 2 weeks ago. Patient states shoulder is doing very well.    Past Medical History:  Diagnosis Date  . Allergic rhinitis   . Anxiety    overlap with depression and ADD  . Asthma   . Hepatitis A   . Hypertension   . Migraine   . Migraines   . OSA on CPAP    Sleep study 12/2011: severe, CPAP   Past Surgical History:  Procedure Laterality Date  . LASIK  2005  . WISDOM TOOTH EXTRACTION     Social History   Social History  . Marital status: Single    Spouse name: N/A  . Number of children: N/A  . Years of education: N/A   Occupational History  . 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 Smoking status: Former Smoker    Packs/day: 0.24    Years: 3.00    Types: Cigarettes    Quit date: 09/22/2008  . Smokeless tobacco: Never Used  . Alcohol use 0.0 oz/week     Comment: OCCASSIONAL  . Drug use: No  . Sexual activity: Not Asked   Other Topics Concern  . 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 Erythromycin     Pt states med make him sick   Family History  Problem  Relation Age of Onset  . Hypertension Mother   . Arthritis Father   . Hypertension Father   . Lung cancer Maternal Grandfather   . Diabetes Other   . Emphysema Paternal Grandfather   . Asthma Paternal Grandfather      Past medical history, social, surgical and family history all reviewed in electronic medical record.  No pertanent information unless stated regarding to the chief complaint.   Review of Systems:Review of systems updated and as accurate as of 05/07/17  No visual changes, nausea, vomiting, diarrhea, constipation, dizziness, abdominal pain, skin rash, fevers, chills, night sweats, weight loss, swollen lymph nodes, body aches, joint swelling,  chest pain, shortness of breath, mood changes. Positive muscle aches positive headaches  Objective  Blood pressure 132/88, pulse 70, weight 174 lb (78.9 kg). Systems examined below as of 05/07/17   General: No apparent distress alert and oriented x3 mood and affect normal, dressed appropriately.  HEENT: Pupils equal, extraocular movements intact  Respiratory: Patient's speak in full sentences and does not appear short of breath  Cardiovascular: No lower extremity edema, non tender, no erythema  Skin: Warm dry intact with no signs of infection or rash on extremities or on axial skeleton.  Abdomen: Soft nontender  Neuro: Cranial nerves  II through XII are intact, neurovascularly intact in all extremities with 2+ DTRs and 2+ pulses.  Lymph: No lymphadenopathy of posterior or anterior cervical chain or axillae bilaterally.  Gait normal with good balance and coordination.  MSK:  Non tender with full range of motion and good stability and symmetric strength and tone of  elbows, wrist, hip, knee and ankles bilaterally.  Neck: Inspection unremarkable. No palpable stepoffs. Negative Spurling's maneuver. Tightness with lacking the last 10 of rotation bilaterally Grip strength and sensation normal in bilateral hands Strength good C4 to T1  distribution No sensory change to C4 to T1 Negative Hoffman sign bilaterally Reflexes normal    Osteopathic findings C2 flexed rotated and side bent right C4 flexed rotated and side bent left C7 flexed rotated and side bent left T3 extended rotated and side bent right inhaled third rib T9 extended rotated and side bent left L3 flexed rotated and side bent right Sacrum right on right    Impression and Recommendations:     This case required medical decision making of moderate complexity.      Note: This dictation was prepared with Dragon dictation along with smaller phrase technology. Any transcriptional errors that result from this process are unintentional.

## 2017-05-07 ENCOUNTER — Other Ambulatory Visit: Payer: Self-pay

## 2017-05-07 ENCOUNTER — Encounter: Payer: Self-pay | Admitting: Family Medicine

## 2017-05-07 ENCOUNTER — Ambulatory Visit (INDEPENDENT_AMBULATORY_CARE_PROVIDER_SITE_OTHER): Payer: BC Managed Care – PPO | Admitting: Family Medicine

## 2017-05-07 VITALS — BP 132/88 | HR 70 | Wt 174.0 lb

## 2017-05-07 DIAGNOSIS — G43909 Migraine, unspecified, not intractable, without status migrainosus: Secondary | ICD-10-CM

## 2017-05-07 DIAGNOSIS — M999 Biomechanical lesion, unspecified: Secondary | ICD-10-CM | POA: Diagnosis not present

## 2017-05-07 MED ORDER — IBUPROFEN-FAMOTIDINE 800-26.6 MG PO TABS: ORAL_TABLET | ORAL | 1 refills | Status: DC

## 2017-05-07 MED ORDER — TIZANIDINE HCL 4 MG PO TABS: 4.0000 mg | ORAL_TABLET | Freq: Every evening | ORAL | 2 refills | Status: AC

## 2017-05-07 MED ORDER — CYCLOBENZAPRINE HCL 5 MG PO TABS: 5.0000 mg | ORAL_TABLET | Freq: Three times a day (TID) | ORAL | 1 refills | Status: DC | PRN

## 2017-05-07 NOTE — Assessment & Plan Note (Signed)
Still believe that some outpatient migraines is more of a cervicogenic headache. 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 Patient has multiple different medications and we may need to consider decreasing in the long run. I do think that anxiety and depression also plays overall. Patient will follow-up with me again in 6 weeks. Adjustable standing desk given.

## 2017-05-07 NOTE — Patient Instructions (Signed)
Good to see you Tony [name] is your friend.  Keep working on the exercises Posture is key  See if you can get a standing desk, I think it could help Refilled the medicine See me again in 6 weeks.

## 2017-05-07 NOTE — Assessment & Plan Note (Signed)
Decision today to treat with OMT was based on Physical Exam  After verbal consent patient was treated with HVLA, ME, FPR techniques in cervical, thoracic,  Rib lumbar and sacral areas  Patient tolerated the procedure well with improvement in symptoms  Patient given exercises, stretches and lifestyle modifications  See medications in patient instructions if given  Patient will follow up in 4-6 weeks

## 2017-06-08 ENCOUNTER — Other Ambulatory Visit: Payer: Self-pay | Admitting: Family Medicine

## 2017-06-08 ENCOUNTER — Other Ambulatory Visit: Payer: Self-pay | Admitting: Internal Medicine

## 2017-06-08 MED ORDER — AMPHETAMINE-DEXTROAMPHET ER 30 MG PO CP24: 30.0000 mg | ORAL_CAPSULE | ORAL | 0 refills | Status: DC

## 2017-06-08 MED ORDER — LOSARTAN POTASSIUM-HCTZ 50-12.5 MG PO TABS: 1.0000 | ORAL_TABLET | Freq: Every day | ORAL | 2 refills | Status: DC

## 2017-06-08 MED ORDER — MUPIROCIN 2 % EX OINT: 1.0000 "application " | TOPICAL_OINTMENT | Freq: Two times a day (BID) | CUTANEOUS | 0 refills | Status: DC

## 2017-06-08 MED ORDER — SERTRALINE HCL 100 MG PO TABS: 100.0000 mg | ORAL_TABLET | Freq: Every day | ORAL | 2 refills | Status: DC

## 2017-06-08 MED ORDER — CLONAZEPAM 0.5 MG PO TABS: 0.5000 mg | ORAL_TABLET | Freq: Two times a day (BID) | ORAL | 0 refills | Status: DC

## 2017-06-08 NOTE — Telephone Encounter (Signed)
[place] controlled substance database checked.  Ok to fill medication.  

## 2017-06-08 NOTE — Telephone Encounter (Signed)
[place] controlled substance database checked.  Ok to fill medication.  

## 2017-06-10 ENCOUNTER — Other Ambulatory Visit: Payer: Self-pay | Admitting: *Deleted

## 2017-06-10 MED ORDER — TIZANIDINE HCL 4 MG PO TABS: 4.0000 mg | ORAL_TABLET | Freq: Every evening | ORAL | 0 refills | Status: DC

## 2017-06-28 ENCOUNTER — Other Ambulatory Visit: Payer: Self-pay | Admitting: Family Medicine

## 2017-06-29 ENCOUNTER — Other Ambulatory Visit: Payer: Self-pay | Admitting: Family Medicine

## 2017-07-06 ENCOUNTER — Encounter: Payer: Self-pay | Admitting: Internal Medicine

## 2017-07-06 ENCOUNTER — Emergency Department (HOSPITAL_COMMUNITY): Payer: BC Managed Care – PPO

## 2017-07-06 ENCOUNTER — Inpatient Hospital Stay (HOSPITAL_COMMUNITY)
Admission: EM | Admit: 2017-07-06 | Discharge: 2017-07-11 | DRG: 872 | Disposition: A | Discharge status: Home / Self Care | Payer: BC Managed Care – PPO | Attending: Internal Medicine | Admitting: Internal Medicine

## 2017-07-06 ENCOUNTER — Encounter (HOSPITAL_COMMUNITY): Payer: Self-pay | Admitting: Emergency Medicine

## 2017-07-06 ENCOUNTER — Ambulatory Visit (INDEPENDENT_AMBULATORY_CARE_PROVIDER_SITE_OTHER): Payer: BC Managed Care – PPO | Admitting: Internal Medicine

## 2017-07-06 VITALS — BP 142/90 | HR 117 | Temp 102.7°F | Resp 18

## 2017-07-06 DIAGNOSIS — F32A Depression, unspecified: Secondary | ICD-10-CM | POA: Diagnosis present

## 2017-07-06 DIAGNOSIS — R109 Unspecified abdominal pain: Secondary | ICD-10-CM | POA: Diagnosis present

## 2017-07-06 DIAGNOSIS — I10 Essential (primary) hypertension: Secondary | ICD-10-CM | POA: Diagnosis present

## 2017-07-06 DIAGNOSIS — Z79899 Other long term (current) drug therapy: Secondary | ICD-10-CM

## 2017-07-06 DIAGNOSIS — E118 Type 2 diabetes mellitus with unspecified complications: Secondary | ICD-10-CM

## 2017-07-06 DIAGNOSIS — J4520 Mild intermittent asthma, uncomplicated: Secondary | ICD-10-CM | POA: Diagnosis present

## 2017-07-06 DIAGNOSIS — Z87891 Personal history of nicotine dependence: Secondary | ICD-10-CM

## 2017-07-06 DIAGNOSIS — F988 Other specified behavioral and emotional disorders with onset usually occurring in childhood and adolescence: Secondary | ICD-10-CM | POA: Diagnosis present

## 2017-07-06 DIAGNOSIS — A419 Sepsis, unspecified organism: Secondary | ICD-10-CM | POA: Diagnosis present

## 2017-07-06 DIAGNOSIS — F329 Major depressive disorder, single episode, unspecified: Secondary | ICD-10-CM | POA: Diagnosis present

## 2017-07-06 DIAGNOSIS — F331 Major depressive disorder, recurrent, moderate: Secondary | ICD-10-CM | POA: Diagnosis present

## 2017-07-06 DIAGNOSIS — K4020 Bilateral inguinal hernia, without obstruction or gangrene, not specified as recurrent: Secondary | ICD-10-CM | POA: Diagnosis present

## 2017-07-06 DIAGNOSIS — F419 Anxiety disorder, unspecified: Secondary | ICD-10-CM | POA: Diagnosis present

## 2017-07-06 DIAGNOSIS — R7989 Other specified abnormal findings of blood chemistry: Secondary | ICD-10-CM | POA: Diagnosis present

## 2017-07-06 DIAGNOSIS — D696 Thrombocytopenia, unspecified: Secondary | ICD-10-CM | POA: Diagnosis present

## 2017-07-06 DIAGNOSIS — R945 Abnormal results of liver function studies: Secondary | ICD-10-CM | POA: Diagnosis present

## 2017-07-06 DIAGNOSIS — R933 Abnormal findings on diagnostic imaging of other parts of digestive tract: Secondary | ICD-10-CM

## 2017-07-06 DIAGNOSIS — R1110 Vomiting, unspecified: Secondary | ICD-10-CM

## 2017-07-06 DIAGNOSIS — R52 Pain, unspecified: Secondary | ICD-10-CM | POA: Insufficient documentation

## 2017-07-06 DIAGNOSIS — R509 Fever, unspecified: Secondary | ICD-10-CM | POA: Diagnosis not present

## 2017-07-06 DIAGNOSIS — A4189 Other specified sepsis: Principal | ICD-10-CM | POA: Diagnosis present

## 2017-07-06 DIAGNOSIS — E876 Hypokalemia: Secondary | ICD-10-CM | POA: Diagnosis present

## 2017-07-06 DIAGNOSIS — G4733 Obstructive sleep apnea (adult) (pediatric): Secondary | ICD-10-CM | POA: Diagnosis present

## 2017-07-06 DIAGNOSIS — K5730 Diverticulosis of large intestine without perforation or abscess without bleeding: Secondary | ICD-10-CM | POA: Diagnosis present

## 2017-07-06 DIAGNOSIS — K219 Gastro-esophageal reflux disease without esophagitis: Secondary | ICD-10-CM | POA: Diagnosis present

## 2017-07-06 DIAGNOSIS — R6510 Systemic inflammatory response syndrome (SIRS) of non-infectious origin without acute organ dysfunction: Secondary | ICD-10-CM

## 2017-07-06 DIAGNOSIS — G43909 Migraine, unspecified, not intractable, without status migrainosus: Secondary | ICD-10-CM | POA: Diagnosis present

## 2017-07-06 DIAGNOSIS — K429 Umbilical hernia without obstruction or gangrene: Secondary | ICD-10-CM | POA: Diagnosis present

## 2017-07-06 LAB — CBC WITH DIFFERENTIAL/PLATELET
BASOS ABS: 0 10*3/uL (ref 0.0–0.1)
Basophils Relative: 1 %
Eosinophils Absolute: 0 10*3/uL (ref 0.0–0.7)
Eosinophils Relative: 0 %
HEMATOCRIT: 45.4 % (ref 39.0–52.0)
HEMOGLOBIN: 16.1 g/dL (ref 13.0–17.0)
LYMPHS PCT: 6 %
Lymphs Abs: 0.2 10*3/uL — ABNORMAL LOW (ref 0.7–4.0)
MCH: 28.7 pg (ref 26.0–34.0)
MCHC: 35.5 g/dL (ref 30.0–36.0)
MCV: 80.9 fL (ref 78.0–100.0)
MONO ABS: 0.1 10*3/uL (ref 0.1–1.0)
MONOS PCT: 4 %
NEUTROS ABS: 2.5 10*3/uL (ref 1.7–7.7)
NEUTROS PCT: 89 %
Platelets: 143 10*3/uL — ABNORMAL LOW (ref 150–400)
RBC: 5.61 MIL/uL (ref 4.22–5.81)
RDW: 13.7 % (ref 11.5–15.5)
WBC: 2.8 10*3/uL — ABNORMAL LOW (ref 4.0–10.5)

## 2017-07-06 LAB — URINALYSIS, ROUTINE W REFLEX MICROSCOPIC
BACTERIA UA: NONE SEEN
GLUCOSE, UA: NEGATIVE mg/dL
HGB URINE DIPSTICK: NEGATIVE
Ketones, ur: NEGATIVE mg/dL
LEUKOCYTES UA: NEGATIVE
NITRITE: NEGATIVE
PROTEIN: 100 mg/dL — AB
SQUAMOUS EPITHELIAL / LPF: NONE SEEN
Specific Gravity, Urine: 1.025 (ref 1.005–1.030)
pH: 5 (ref 5.0–8.0)

## 2017-07-06 LAB — COMPREHENSIVE METABOLIC PANEL
ALBUMIN: 4.3 g/dL (ref 3.5–5.0)
ALT: 252 U/L — ABNORMAL HIGH (ref 17–63)
ANION GAP: 10 (ref 5–15)
AST: 129 U/L — ABNORMAL HIGH (ref 15–41)
Alkaline Phosphatase: 146 U/L — ABNORMAL HIGH (ref 38–126)
BUN: 11 mg/dL (ref 6–20)
CO2: 24 mmol/L (ref 22–32)
Calcium: 8.7 mg/dL — ABNORMAL LOW (ref 8.9–10.3)
Chloride: 101 mmol/L (ref 101–111)
Creatinine, Ser: 1.17 mg/dL (ref 0.61–1.24)
GFR calc non Af Amer: >60 mL/min (ref >60)
GLUCOSE: 112 mg/dL — AB (ref 65–99)
POTASSIUM: 3.4 mmol/L — AB (ref 3.5–5.1)
SODIUM: 135 mmol/L (ref 135–145)
TOTAL PROTEIN: 7.3 g/dL (ref 6.5–8.1)
Total Bilirubin: 1.4 mg/dL — ABNORMAL HIGH (ref 0.3–1.2)

## 2017-07-06 LAB — CG4 I-STAT (LACTIC ACID)
LACTIC ACID, VENOUS: 1.96 mmol/L — AB (ref 0.5–1.9)
Lactic Acid, Venous: 1.06 mmol/L (ref 0.5–1.9)

## 2017-07-06 LAB — POCT INFLUENZA A/B
Influenza A, POC: NEGATIVE
Influenza B, POC: NEGATIVE

## 2017-07-06 MED ORDER — ACETAMINOPHEN 325 MG PO TABS
650.0000 mg | ORAL_TABLET | Freq: Once | ORAL | Status: AC | PRN
  Administered 2017-07-06: 650 mg via ORAL
  Filled 2017-07-06: qty 2

## 2017-07-06 MED ORDER — SODIUM CHLORIDE 0.9 % IV BOLUS (SEPSIS)
1000.0000 mL | Freq: Once | INTRAVENOUS | Status: AC
  Administered 2017-07-06: 1000 mL via INTRAVENOUS

## 2017-07-06 NOTE — Progress Notes (Signed)
Subjective:    Patient ID: Tony [name], male    DOB: May 25, 1976, 41 y.o.   MRN: [id number]  HPI He is here for an acute visit for cold symptoms.  His symptoms started two days ago.    He is experiencing fever, chills, fatigue, decreased appetite, ear pain, sinus pain, sore throat, mild cough, SOB, nausea, diarrhea, body aches, headaches and lightheadedness/dizziness.   He denies wheeze, abdominal pain and urinary symptoms.   He has tried taking tylenol and advil.   He denies travel outside the [place] in the past 6 months.  He has traveled within the [place] (none in the last month).  No unprotected sex in the past several month.  No known tick bites.   He feels horrible and is unsure if he can take care of himself at home.   Medications and allergies reviewed with patient and updated if appropriate.  Patient Active Problem List   Diagnosis Date Noted  . Leg wound, right, subsequent encounter 01/21/2017  . Acute bronchitis 09/29/2016  . Mild intermittent asthma with acute exacerbation 09/29/2016  . Esophageal reflux 06/17/2016  . Nonallopathic lesion of cervical region 04/02/2016  . Nonallopathic lesion of thoracic region 04/02/2016  . Nonallopathic lesion-rib cage 04/02/2016  . Pain in right acromioclavicular joint 02/21/2016  . Chronic upper back pain 12/18/2015  . Major depressive disorder, recurrent episode, moderate (HCC) 05/31/2015  . Overweight   . Asthma, mild intermittent 03/31/2013  . Migraine   . Obstructive sleep apnea   . Generalized anxiety disorder   . Seasonal and perennial allergic rhinitis   . Essential hypertension   . ADD (attention deficit disorder)     Current Outpatient Prescriptions on File Prior to Visit  Medication Sig Dispense Refill  . acyclovir ointment (ZOVIRAX) 5 % APPLY TOPICALLY EVERY 3 HOURS 15 g 0  . albuterol (PROVENTIL) (2.5 MG/3ML) 0.083% nebulizer solution Take 3 mLs (2.5 mg total) by nebulization every 6 (six) hours as needed for  wheezing or shortness of breath. 75 mL 12  . amphetamine-dextroamphetamine (ADDERALL XR) 30 MG 24 hr capsule Take 1 capsule (30 mg total) by mouth every morning. 30 capsule 0  . cetirizine (ZYRTEC) 10 MG tablet Take 1 tablet (10 mg total) by mouth daily. 90 tablet 1  . clonazePAM (KLONOPIN) 0.5 MG tablet Take 1 tablet (0.5 mg total) by mouth 2 (two) times daily. 180 tablet 0  . cyclobenzaprine (FLEXERIL) 5 MG tablet Take 1 tablet (5 mg total) by mouth 3 (three) times daily as needed for muscle spasms. 90 tablet 1  . Diclofenac Sodium (PENNSAID) 2 % SOLN Place 2 application onto the skin 2 (two) times daily. 112 g 3  . fluconazole (DIFLUCAN) 150 MG tablet Take 1 tablet (150 mg total) by mouth daily. 7 tablet 0  . fluticasone (FLONASE) 50 MCG/ACT nasal spray USE TWO SPRAY IN NOSE ONCE DAILY 16 g 5  . hydrOXYzine (ATARAX/VISTARIL) 10 MG tablet Take 1 tablet (10 mg total) by mouth 3 (three) times daily as needed. OFFICE VISIT FOR FUTURE REFILLS 90 tablet 0  . hydrOXYzine (ATARAX/VISTARIL) 10 MG tablet TAKE 1 TABLET 3 TIMES A DAYAS NEEDED (OFFICE VISIT    FOR FUTURE REFILLS) 90 tablet 0  . Ibuprofen-Famotidine 800-26.6 MG TABS Take 1 tablet 3 times a day. 270 tablet 1  . indomethacin (INDOCIN) 50 MG capsule TAKE 1 CAPSULE 3 TIMES A   DAY AS NEEDED 270 capsule 0  . Insulin Pen Needle (BD ULTRA-FINE MICRO PEN  NEEDLE) 32G X 6 MM MISC Use with Saxenda 100 each 1  . levalbuterol (XOPENEX HFA) 45 MCG/ACT inhaler Inhale 2 puffs into the lungs every 6 (six) hours as needed for wheezing. 3 Inhaler 0  . losartan-hydrochlorothiazide (HYZAAR) 50-12.5 MG tablet Take 1 tablet by mouth daily. 90 tablet 2  . mupirocin ointment (BACTROBAN) 2 % Apply 1 application topically 2 (two) times daily. 22 g 0  . nystatin (MYCOSTATIN) 100000 UNIT/ML suspension Take 5 mLs (500,000 Units total) by mouth 4 (four) times daily. 200 mL 0  . pantoprazole (PROTONIX) 40 MG tablet Take 1 tablet (40 mg total) by mouth daily. 90 tablet 3  .  SAXENDA 18 MG/3ML SOPN INJECT  INTO SKIN DAILY 45 mL 3  . sertraline (ZOLOFT) 100 MG tablet Take 1 tablet (100 mg total) by mouth daily. 90 tablet 2  . SUMAtriptan (IMITREX) 100 MG tablet Take 1 tablet (100 mg total) by mouth every 2 (two) hours as needed for migraine. May repeat in 2 hours if headache persists or recurs. 30 tablet 3  . tiZANidine (ZANAFLEX) 4 MG tablet TAKE 1 TABLET NIGHTLY 30 tablet 0   No current facility-administered medications on file prior to visit.     Past Medical History:  Diagnosis Date  . Allergic rhinitis   . Anxiety    overlap with depression and ADD  . Asthma   . Hepatitis A   . Hypertension   . Migraine   . Migraines   . OSA on CPAP    Sleep study 12/2011: severe, CPAP    Past Surgical History:  Procedure Laterality Date  . LASIK  2005  . WISDOM TOOTH EXTRACTION      Social History   Social History  . Marital status: Single    Spouse name: N/A  . Number of children: N/A  . Years of education: N/A   Occupational History  . 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 Smoking status: Former Smoker    Packs/day: 0.24    Years: 3.00    Types: Cigarettes    Quit date: 09/22/2008  . Smokeless tobacco: Never Used  . Alcohol use 0.0 oz/week     Comment: OCCASSIONAL  . Drug use: No  . Sexual activity: Not Asked   Other Topics Concern  . 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 Hypertension Mother   . Arthritis Father   . Hypertension Father   . Lung cancer Maternal Grandfather   . Diabetes Other   . Emphysema Paternal Grandfather   . Asthma Paternal Grandfather     Review of Systems  Constitutional: Positive for appetite change (dec), chills, fatigue and fever.  HENT: Positive for ear pain, sinus pain and sore throat. Negative for congestion (not more than normal).     Respiratory: Positive for cough (white phlegm, mild) and shortness of breath. Negative for wheezing.   Cardiovascular: Positive for chest pain.  Gastrointestinal: Positive for diarrhea, nausea and vomiting (x 2 three days ago). Negative for abdominal pain.  Genitourinary: Negative for dysuria and hematuria.  Musculoskeletal: Positive for arthralgias and myalgias.  Neurological: Positive for dizziness, light-headedness and headaches.       Objective:   Vitals:   07/06/17 1546  BP: (!) 142/90  Pulse: (!) 117  Resp: 18  Temp: Marland Kitchen)  102.7 F (39.3 C)  SpO2: 97%   There were no vitals filed for this visit. There is no height or weight on file to calculate BMI.  Wt Readings from Last 3 Encounters:  05/07/17 174 lb (78.9 kg)  04/23/17 178 lb (80.7 kg)  01/21/17 174 lb (78.9 kg)     Physical Exam GENERAL APPEARANCE: moderately ill appearing, NAD EYES: conjunctiva clear, no icterus HEENT: bilateral tympanic membranes and ear canals normal, oropharynx with no erythema, no thyromegaly, trachea midline, no cervical or supraclavicular lymphadenopathy LUNGS: Clear to auscultation without wheeze or crackles, unlabored breathing, good air entry bilaterally HEART: Normal S1,S2 without murmurs ABD:  Soft, non tender, non distended EXTREMITIES: Without clubbing, cyanosis, or edema        Assessment & Plan:   See Problem List for Assessment and Plan of chronic medical problems.

## 2017-07-06 NOTE — Assessment & Plan Note (Signed)
Rapid flu negative here He does not he can go home and take care of him self - he is not eating or drinking much and is likely dehydrated He may have a viral infection, but other causes of his fever /other symptoms needs to be ruled out He will go to the ED for further evaluation / treatment

## 2017-07-06 NOTE — ED Triage Notes (Signed)
Pt sent from MD office for fever with unknown diagnosis; flu negative at MD office; pt complaint fever, body aches, headache, and nasal congestion.

## 2017-07-06 NOTE — Patient Instructions (Signed)
Go the ED for further evaluation and treatment.

## 2017-07-06 NOTE — Assessment & Plan Note (Signed)
Rapid flu negative here He does not he can go home and take care of him self - he is not eating or drinking much and is likely dehydrated He may have a viral infection, but other causes of his fever /other symptoms needs to be ruled out He will go to the ED for further evaluation / treatment 

## 2017-07-07 ENCOUNTER — Emergency Department (HOSPITAL_COMMUNITY): Payer: BC Managed Care – PPO

## 2017-07-07 ENCOUNTER — Encounter (HOSPITAL_COMMUNITY): Payer: Self-pay

## 2017-07-07 ENCOUNTER — Ambulatory Visit: Payer: Self-pay | Admitting: Internal Medicine

## 2017-07-07 ENCOUNTER — Other Ambulatory Visit: Payer: Self-pay

## 2017-07-07 DIAGNOSIS — R509 Fever, unspecified: Secondary | ICD-10-CM | POA: Diagnosis present

## 2017-07-07 DIAGNOSIS — E876 Hypokalemia: Secondary | ICD-10-CM | POA: Diagnosis present

## 2017-07-07 DIAGNOSIS — G4733 Obstructive sleep apnea (adult) (pediatric): Secondary | ICD-10-CM | POA: Diagnosis present

## 2017-07-07 DIAGNOSIS — K219 Gastro-esophageal reflux disease without esophagitis: Secondary | ICD-10-CM

## 2017-07-07 DIAGNOSIS — J4520 Mild intermittent asthma, uncomplicated: Secondary | ICD-10-CM

## 2017-07-07 DIAGNOSIS — F419 Anxiety disorder, unspecified: Secondary | ICD-10-CM | POA: Diagnosis present

## 2017-07-07 DIAGNOSIS — R1011 Right upper quadrant pain: Secondary | ICD-10-CM

## 2017-07-07 DIAGNOSIS — K4020 Bilateral inguinal hernia, without obstruction or gangrene, not specified as recurrent: Secondary | ICD-10-CM | POA: Diagnosis present

## 2017-07-07 DIAGNOSIS — D72819 Decreased white blood cell count, unspecified: Secondary | ICD-10-CM | POA: Diagnosis not present

## 2017-07-07 DIAGNOSIS — R945 Abnormal results of liver function studies: Secondary | ICD-10-CM | POA: Diagnosis present

## 2017-07-07 DIAGNOSIS — K5730 Diverticulosis of large intestine without perforation or abscess without bleeding: Secondary | ICD-10-CM | POA: Diagnosis present

## 2017-07-07 DIAGNOSIS — Z87891 Personal history of nicotine dependence: Secondary | ICD-10-CM | POA: Diagnosis not present

## 2017-07-07 DIAGNOSIS — G43909 Migraine, unspecified, not intractable, without status migrainosus: Secondary | ICD-10-CM | POA: Diagnosis present

## 2017-07-07 DIAGNOSIS — Z79899 Other long term (current) drug therapy: Secondary | ICD-10-CM | POA: Diagnosis not present

## 2017-07-07 DIAGNOSIS — I10 Essential (primary) hypertension: Secondary | ICD-10-CM | POA: Diagnosis not present

## 2017-07-07 DIAGNOSIS — F988 Other specified behavioral and emotional disorders with onset usually occurring in childhood and adolescence: Secondary | ICD-10-CM | POA: Diagnosis present

## 2017-07-07 DIAGNOSIS — D696 Thrombocytopenia, unspecified: Secondary | ICD-10-CM | POA: Diagnosis not present

## 2017-07-07 DIAGNOSIS — R109 Unspecified abdominal pain: Secondary | ICD-10-CM | POA: Diagnosis present

## 2017-07-07 DIAGNOSIS — A4189 Other specified sepsis: Secondary | ICD-10-CM | POA: Diagnosis present

## 2017-07-07 DIAGNOSIS — A419 Sepsis, unspecified organism: Secondary | ICD-10-CM | POA: Diagnosis not present

## 2017-07-07 DIAGNOSIS — F331 Major depressive disorder, recurrent, moderate: Secondary | ICD-10-CM | POA: Diagnosis present

## 2017-07-07 DIAGNOSIS — R1110 Vomiting, unspecified: Secondary | ICD-10-CM

## 2017-07-07 DIAGNOSIS — R7989 Other specified abnormal findings of blood chemistry: Secondary | ICD-10-CM | POA: Diagnosis present

## 2017-07-07 DIAGNOSIS — K429 Umbilical hernia without obstruction or gangrene: Secondary | ICD-10-CM | POA: Diagnosis present

## 2017-07-07 LAB — RAPID HIV SCREEN (HIV 1/2 AB+AG)
HIV 1/2 ANTIBODIES: NONREACTIVE
HIV-1 P24 ANTIGEN - HIV24: NONREACTIVE

## 2017-07-07 LAB — INFLUENZA PANEL BY PCR (TYPE A & B)
INFLBPCR: NEGATIVE
Influenza A By PCR: NEGATIVE

## 2017-07-07 LAB — CREATININE, SERUM: Creatinine, Ser: 1.23 mg/dL (ref 0.61–1.24)

## 2017-07-07 LAB — PROTIME-INR
INR: 1.41
PROTHROMBIN TIME: 17.1 s — AB (ref 11.4–15.2)

## 2017-07-07 LAB — RAPID URINE DRUG SCREEN, HOSP PERFORMED
Amphetamines: POSITIVE — AB
Barbiturates: NOT DETECTED
Benzodiazepines: POSITIVE — AB
Cocaine: NOT DETECTED
OPIATES: NOT DETECTED
TETRAHYDROCANNABINOL: POSITIVE — AB

## 2017-07-07 LAB — APTT: APTT: 28 s (ref 24–36)

## 2017-07-07 LAB — RPR: RPR: NONREACTIVE

## 2017-07-07 LAB — ACETAMINOPHEN LEVEL

## 2017-07-07 LAB — PROCALCITONIN: PROCALCITONIN: 0.47 ng/mL

## 2017-07-07 LAB — LIPASE, BLOOD: LIPASE: 35 U/L (ref 11–51)

## 2017-07-07 MED ORDER — IOPAMIDOL (ISOVUE-300) INJECTION 61%
INTRAVENOUS | Status: AC
  Administered 2017-07-07: 100 mL via INTRAVENOUS
  Filled 2017-07-07: qty 100

## 2017-07-07 MED ORDER — VANCOMYCIN HCL IN DEXTROSE 1-5 GM/200ML-% IV SOLN
1000.0000 mg | Freq: Two times a day (BID) | INTRAVENOUS | Status: DC
  Administered 2017-07-07 – 2017-07-08 (×2): 1000 mg via INTRAVENOUS
  Filled 2017-07-07 (×2): qty 200

## 2017-07-07 MED ORDER — DICLOFENAC SODIUM 2 % TD SOLN: 2.0000 "application " | Freq: Two times a day (BID) | TRANSDERMAL | Status: DC

## 2017-07-07 MED ORDER — SODIUM CHLORIDE 0.9 % IV BOLUS (SEPSIS)
500.0000 mL | Freq: Once | INTRAVENOUS | Status: AC
  Administered 2017-07-07: 500 mL via INTRAVENOUS

## 2017-07-07 MED ORDER — ACETAMINOPHEN 325 MG PO TABS
650.0000 mg | ORAL_TABLET | Freq: Four times a day (QID) | ORAL | Status: DC | PRN
  Administered 2017-07-07: 650 mg via ORAL
  Filled 2017-07-07: qty 2

## 2017-07-07 MED ORDER — LORAZEPAM 0.5 MG PO TABS
0.5000 mg | ORAL_TABLET | Freq: Once | ORAL | Status: AC | PRN
  Administered 2017-07-07: 0.5 mg via ORAL
  Filled 2017-07-07: qty 1

## 2017-07-07 MED ORDER — AMPHETAMINE-DEXTROAMPHET ER 5 MG PO CP24: 30.0000 mg | ORAL_CAPSULE | Freq: Every day | ORAL | Status: DC

## 2017-07-07 MED ORDER — PIPERACILLIN-TAZOBACTAM 3.375 G IVPB
3.3750 g | Freq: Three times a day (TID) | INTRAVENOUS | Status: DC
  Administered 2017-07-07 – 2017-07-09 (×7): 3.375 g via INTRAVENOUS
  Filled 2017-07-07 (×8): qty 50

## 2017-07-07 MED ORDER — IOPAMIDOL (ISOVUE-300) INJECTION 61%
100.0000 mL | Freq: Once | INTRAVENOUS | Status: AC | PRN
  Administered 2017-07-07: 100 mL via INTRAVENOUS

## 2017-07-07 MED ORDER — POTASSIUM CHLORIDE 20 MEQ/15ML (10%) PO SOLN: 20.0000 meq | Freq: Once | ORAL | Status: DC

## 2017-07-07 MED ORDER — MORPHINE SULFATE (PF) 2 MG/ML IV SOLN: 2.0000 mg | INTRAVENOUS | Status: DC | PRN

## 2017-07-07 MED ORDER — ENOXAPARIN SODIUM 40 MG/0.4ML ~~LOC~~ SOLN
40.0000 mg | SUBCUTANEOUS | Status: DC
  Administered 2017-07-07: 40 mg via SUBCUTANEOUS
  Filled 2017-07-07: qty 0.4

## 2017-07-07 MED ORDER — MORPHINE SULFATE (PF) 4 MG/ML IV SOLN
2.0000 mg | INTRAVENOUS | Status: DC | PRN
  Administered 2017-07-07 – 2017-07-11 (×3): 2 mg via INTRAVENOUS
  Filled 2017-07-07 (×3): qty 1

## 2017-07-07 MED ORDER — METOCLOPRAMIDE HCL 5 MG/ML IJ SOLN
10.0000 mg | Freq: Once | INTRAMUSCULAR | Status: AC
  Administered 2017-07-07: 10 mg via INTRAVENOUS
  Filled 2017-07-07: qty 2

## 2017-07-07 MED ORDER — ONDANSETRON HCL 4 MG/2ML IJ SOLN
4.0000 mg | Freq: Three times a day (TID) | INTRAMUSCULAR | Status: DC | PRN
  Administered 2017-07-09: 4 mg via INTRAVENOUS
  Filled 2017-07-07: qty 2

## 2017-07-07 MED ORDER — HYDRALAZINE HCL 20 MG/ML IJ SOLN: 5.0000 mg | INTRAMUSCULAR | Status: DC | PRN

## 2017-07-07 MED ORDER — ALBUTEROL SULFATE (2.5 MG/3ML) 0.083% IN NEBU: 2.5000 mg | INHALATION_SOLUTION | RESPIRATORY_TRACT | Status: DC | PRN

## 2017-07-07 MED ORDER — CLONAZEPAM 0.5 MG PO TABS
0.5000 mg | ORAL_TABLET | Freq: Two times a day (BID) | ORAL | Status: DC
  Administered 2017-07-07 – 2017-07-11 (×9): 0.5 mg via ORAL
  Filled 2017-07-07 (×9): qty 1

## 2017-07-07 MED ORDER — ACYCLOVIR 5 % EX OINT
TOPICAL_OINTMENT | CUTANEOUS | Status: DC
  Administered 2017-07-07 – 2017-07-11 (×5): via TOPICAL
  Filled 2017-07-07: qty 15

## 2017-07-07 MED ORDER — SODIUM CHLORIDE 0.9 % IV BOLUS (SEPSIS)
1000.0000 mL | Freq: Once | INTRAVENOUS | Status: AC
  Administered 2017-07-07: 1000 mL via INTRAVENOUS

## 2017-07-07 MED ORDER — FLUTICASONE PROPIONATE 50 MCG/ACT NA SUSP
1.0000 | Freq: Every day | NASAL | Status: DC
  Administered 2017-07-07 – 2017-07-11 (×4): 1 via NASAL
  Filled 2017-07-07: qty 16

## 2017-07-07 MED ORDER — IBUPROFEN 200 MG PO TABS
600.0000 mg | ORAL_TABLET | Freq: Once | ORAL | Status: AC
  Administered 2017-07-07: 600 mg via ORAL
  Filled 2017-07-07: qty 3

## 2017-07-07 MED ORDER — CYCLOBENZAPRINE HCL 10 MG PO TABS
5.0000 mg | ORAL_TABLET | Freq: Three times a day (TID) | ORAL | Status: DC | PRN
  Administered 2017-07-10 – 2017-07-11 (×2): 5 mg via ORAL
  Filled 2017-07-07 (×2): qty 1

## 2017-07-07 MED ORDER — SUMATRIPTAN SUCCINATE 50 MG PO TABS
100.0000 mg | ORAL_TABLET | ORAL | Status: DC | PRN
  Administered 2017-07-07 – 2017-07-11 (×8): 100 mg via ORAL
  Filled 2017-07-07 (×10): qty 2

## 2017-07-07 MED ORDER — PANTOPRAZOLE SODIUM 40 MG PO TBEC
40.0000 mg | DELAYED_RELEASE_TABLET | Freq: Every day | ORAL | Status: DC
  Administered 2017-07-07 – 2017-07-11 (×5): 40 mg via ORAL
  Filled 2017-07-07 (×5): qty 1

## 2017-07-07 MED ORDER — OXYMETAZOLINE HCL 0.05 % NA SOLN
2.0000 | Freq: Two times a day (BID) | NASAL | Status: DC
  Administered 2017-07-07 – 2017-07-11 (×9): 2 via NASAL
  Filled 2017-07-07: qty 15

## 2017-07-07 MED ORDER — SERTRALINE HCL 50 MG PO TABS
100.0000 mg | ORAL_TABLET | Freq: Every day | ORAL | Status: DC
  Administered 2017-07-07 – 2017-07-11 (×5): 100 mg via ORAL
  Filled 2017-07-07 (×5): qty 2

## 2017-07-07 MED ORDER — PIPERACILLIN-TAZOBACTAM 3.375 G IVPB 30 MIN
3.3750 g | INTRAVENOUS | Status: AC
  Administered 2017-07-07: 3.375 g via INTRAVENOUS
  Filled 2017-07-07: qty 50

## 2017-07-07 MED ORDER — VANCOMYCIN HCL IN DEXTROSE 1-5 GM/200ML-% IV SOLN
1000.0000 mg | Freq: Once | INTRAVENOUS | Status: AC
  Administered 2017-07-07: 1000 mg via INTRAVENOUS
  Filled 2017-07-07: qty 200

## 2017-07-07 MED ORDER — ZOLPIDEM TARTRATE 5 MG PO TABS
5.0000 mg | ORAL_TABLET | Freq: Every evening | ORAL | Status: DC | PRN
  Administered 2017-07-07 – 2017-07-10 (×4): 5 mg via ORAL
  Filled 2017-07-07 (×4): qty 1

## 2017-07-07 MED ORDER — DICLOFENAC SODIUM 1 % TD GEL
2.0000 g | Freq: Two times a day (BID) | TRANSDERMAL | Status: DC
  Administered 2017-07-07 – 2017-07-11 (×7): 2 g via TOPICAL
  Filled 2017-07-07: qty 100

## 2017-07-07 MED ORDER — DIPHENHYDRAMINE HCL 50 MG/ML IJ SOLN
25.0000 mg | Freq: Once | INTRAMUSCULAR | Status: AC
  Administered 2017-07-07: 25 mg via INTRAVENOUS
  Filled 2017-07-07: qty 1

## 2017-07-07 MED ORDER — SODIUM CHLORIDE 0.9 % IV SOLN
INTRAVENOUS | Status: DC
  Administered 2017-07-07 – 2017-07-11 (×7): via INTRAVENOUS

## 2017-07-07 MED ORDER — TIZANIDINE HCL 4 MG PO TABS
4.0000 mg | ORAL_TABLET | Freq: Every day | ORAL | Status: DC
  Administered 2017-07-07 – 2017-07-10 (×4): 4 mg via ORAL
  Filled 2017-07-07 (×4): qty 1

## 2017-07-07 MED ORDER — IBUPROFEN 200 MG PO TABS
200.0000 mg | ORAL_TABLET | Freq: Four times a day (QID) | ORAL | Status: DC | PRN
  Administered 2017-07-07: 200 mg via ORAL
  Filled 2017-07-07: qty 1

## 2017-07-07 NOTE — Care Management Note (Signed)
Case Management Note  Patient Details  Name: DJUAN TALTON MRN: [id number] Date of Birth: 02/07/1976  Subjective/Objective:                  Temp 102.3 sepsis  Action/Plan: Date:  October 16,2 018 Chart reviewed for concurrent status and case management needs.  Will continue to follow patient progress.  Discharge Planning: following for needs  Expected discharge date: [id number]  Marcelle Smiling, BSN, [place], [place]   [phone]   Expected Discharge Date:                  Expected Discharge Plan:  Home/Self Care  In-House Referral:     Discharge planning Services  CM Consult  Post Acute Care Choice:    Choice offered to:     DME Arranged:    DME Agency:     HH Arranged:    HH Agency:     Status of Service:  In process, will continue to follow  If discussed at Long Length of Stay Meetings, dates discussed:    Additional Comments:  Golda Acre, RN 07/07/2017, 9:08 AM

## 2017-07-07 NOTE — Progress Notes (Signed)
Patient seen and evaluated earlier the same by my associate. Please refer to H&P for details regarding history, assessment, and plan.  Patient continues to complain of headaches will add acetaminophen and he has been having difficulty sleeping, we'll give him low-dose Ativan as well.  Gen.: Patient in no acute distress Cardiovascular: No cyanosis Pulmonary: No increased work of breathing, no wheezes  Patient presented with sepsis presumed to be secondary to viral etiology. Acute hepatitis is a reported potential differential diagnosis.  Penny Pia   Will reassess next am.

## 2017-07-07 NOTE — Progress Notes (Signed)
A consult was received from an ED physician for zosyn and vancomycin per pharmacy dosing.  The patient's profile has been reviewed for ht/wt/allergies/indication/available labs.   A one time order has been placed for zosyn 3.375 Gm and Vancomycin 1 Gm.  Further antibiotics/pharmacy consults should be ordered by admitting physician if indicated.                       Thank you, Lorenza Evangelist 07/07/2017  12:40 AM

## 2017-07-07 NOTE — Progress Notes (Signed)
Pharmacy Antibiotic Note  Tony [name] is a 41 y.o. male with fever admitted on 07/06/2017 with sepsis.  Pharmacy has been consulted for zosyn and vancomycin dosing.  Plan: Zosyn 3.375g IV q8h (4 hour infusion).  Vancomycin 1 Gm IV q12h VT=15-20 mg/L Daily Scr F/u cultures/levels  Height:  (170.2 cm) Weight: 174 lb (78.9 kg) IBW/kg (Calculated) : 66.1  Temp (24hrs), Avg:101.2 F (38.4 C), Min:99.2 F (37.3 C), Max:103.2 F (39.6 C)   Recent Labs Lab 07/06/17 1729 07/06/17 1745 07/07/17 0002  WBC 2.8*  --   --   CREATININE 1.17  --   --   LATICACIDVEN  --  1.06 1.96*    Estimated Creatinine Clearance: 76.9 mL/min (by C-G formula based on SCr of 1.17 mg/dL).    Allergies  Allergen Reactions  . Erythromycin     Pt states med make him sick    Antimicrobials this admission: 10/15 zosyn >>  10/15 vancomycin >>   Dose adjustments this admission:   Microbiology results:  BCx:   UCx:    Sputum:    MRSA PCR:   Thank you for allowing pharmacy to be a part of this patient's care.  Lorenza Evangelist 07/07/2017 6:10 AM

## 2017-07-07 NOTE — ED Provider Notes (Signed)
Tony [name] Provider Note   CSN: [id number] Arrival date & time: 07/06/17  1631     History   Chief Complaint Chief Complaint  Patient presents with  . Fever    HPI Tony [name] is a 41 y.o. male.  HPI 41 year old Caucasian male history of hypertension, migraines that presents to the emergency department today after he referred from PCP for fever. The patient states that he has a fever for the past 2 days. States his max temperature has been 103. The patient denies any associated URI symptoms. States he does have some nasal congestion but this is baseline for patient. Patient reports an episode of loose stool 2 days ago. Denies any associated abdominal pain or urinary symptoms. The patient reports some mild nausea but denies any emesis. Denies any associated cough. Reports associated body aches. Patient has been taking Tylenol and Advil patient denies any recent travel outside the [place]. Denies any recent a protective sex. Denies any known tick bites. The patient states he thinks needed to be admitted because he cannot take care of himself at home. Patient is unsure of any sick contacts. Denies any nuchal rigidity. Was seen his primary care office today with a negative flu test and sent to the ED for evaluation.  Pt denies any vision changes, lightheadedness, dizziness, congestion, neck pain, cp, sob, cough, abd pain, n/v/d, urinary symptoms, change in bowel habits, melena, hematochezia, lower extremity paresthesias.  Past Medical History:  Diagnosis Date  . Allergic rhinitis   . Anxiety    overlap with depression and ADD  . Asthma   . Hepatitis A   . Hypertension   . Migraine   . Migraines   . OSA on CPAP    Sleep study 12/2011: severe, CPAP    Patient Active Problem List   Diagnosis Date Noted  . Fever 07/06/2017  . Body aches 07/06/2017  . Leg wound, right, subsequent encounter 01/21/2017  . Acute bronchitis 09/29/2016  . Mild  intermittent asthma with acute exacerbation 09/29/2016  . Esophageal reflux 06/17/2016  . Nonallopathic lesion of cervical region 04/02/2016  . Nonallopathic lesion of thoracic region 04/02/2016  . Nonallopathic lesion-rib cage 04/02/2016  . Pain in right acromioclavicular joint 02/21/2016  . Chronic upper back pain 12/18/2015  . Major depressive disorder, recurrent episode, moderate (HCC) 05/31/2015  . Overweight   . Asthma, mild intermittent 03/31/2013  . Migraine   . Obstructive sleep apnea   . Generalized anxiety disorder   . Seasonal and perennial allergic rhinitis   . Essential hypertension   . ADD (attention deficit disorder)     Past Surgical History:  Procedure Laterality Date  . LASIK  2005  . WISDOM TOOTH EXTRACTION         Home Medications    Prior to Admission medications   Medication Sig Start Date End Date Taking? Authorizing Provider  acetaminophen (TYLENOL) 500 MG tablet Take 500 mg by mouth every 6 (six) hours as needed for mild pain.   Yes [provider]  acyclovir ointment (ZOVIRAX) 5 % APPLY TOPICALLY EVERY 3 HOURS 07/20/15  Yes Newt Lukes, MD  albuterol (PROVENTIL) (2.5 MG/3ML) 0.083% nebulizer solution Take 3 mLs (2.5 mg total) by nebulization every 6 (six) hours as needed for wheezing or shortness of breath. 09/29/16  Yes Burns, Bobette Mo, MD  amphetamine-dextroamphetamine (ADDERALL XR) 30 MG 24 hr capsule Take 1 capsule (30 mg total) by mouth every morning. 06/08/17  Yes Cheryll Cockayne  J, MD  clonazePAM (KLONOPIN) 0.5 MG tablet Take 1 tablet (0.5 mg total) by mouth 2 (two) times daily. 06/08/17  Yes Burns, Bobette Mo, MD  cyclobenzaprine (FLEXERIL) 5 MG tablet Take 1 tablet (5 mg total) by mouth 3 (three) times daily as needed for muscle spasms. 05/07/17  Yes Antoine Primas M, DO  Diclofenac Sodium (PENNSAID) 2 % SOLN Place 2 application onto the skin 2 (two) times daily. 01/25/16  Yes Antoine Primas M, DO  fluticasone (FLONASE) 50 MCG/ACT nasal  spray USE TWO SPRAY IN NOSE ONCE DAILY 10/02/16  Yes Burns, Bobette Mo, MD  hydrOXYzine (ATARAX/VISTARIL) 10 MG tablet Take 1 tablet (10 mg total) by mouth 3 (three) times daily as needed. OFFICE VISIT FOR FUTURE REFILLS 04/17/17  Yes Antoine Primas M, DO  ibuprofen (ADVIL,MOTRIN) 200 MG tablet Take 400 mg by mouth every 6 (six) hours as needed for moderate pain.   Yes [provider]  Ibuprofen-Famotidine 800-26.6 MG TABS Take 1 tablet 3 times a day. 05/07/17  Yes Antoine Primas M, DO  indomethacin (INDOCIN) 50 MG capsule TAKE 1 CAPSULE 3 TIMES A   DAY AS NEEDED 12/11/16  Yes Burns, Bobette Mo, MD  levalbuterol [facility] HFA) 45 MCG/ACT inhaler Inhale 2 puffs into the lungs every 6 (six) hours as needed for wheezing. 09/30/16  Yes Burns, Bobette Mo, MD  losartan-hydrochlorothiazide (HYZAAR) 50-12.5 MG tablet Take 1 tablet by mouth daily. 06/08/17  Yes Burns, Bobette Mo, MD  pantoprazole (PROTONIX) 40 MG tablet Take 1 tablet (40 mg total) by mouth daily. 06/17/16  Yes Pincus Sanes, MD  SAXENDA 18 MG/3ML SOPN INJECT  INTO SKIN DAILY 12/12/16  Yes Pincus Sanes, MD  sertraline (ZOLOFT) 100 MG tablet Take 1 tablet (100 mg total) by mouth daily. 06/08/17  Yes Burns, Bobette Mo, MD  SUMAtriptan (IMITREX) 100 MG tablet Take 1 tablet (100 mg total) by mouth every 2 (two) hours as needed for migraine. May repeat in 2 hours if headache persists or recurs. 12/16/16  Yes Pincus Sanes, MD  tiZANidine (ZANAFLEX) 4 MG tablet TAKE 1 TABLET NIGHTLY 06/29/17  Yes Antoine Primas M, DO  Insulin Pen Needle (BD ULTRA-FINE MICRO PEN NEEDLE) 32G X 6 MM MISC Use with Saxenda 12/17/16   Pincus Sanes, MD    Family History Family History  Problem Relation Age of Onset  . Hypertension Mother   . Arthritis Father   . Hypertension Father   . Lung cancer Maternal Grandfather   . Diabetes Other   . Emphysema Paternal Grandfather   . Asthma Paternal Grandfather     Social History Social History  Substance Use Topics  . Smoking  status: Former Smoker    Packs/day: 0.24    Years: 3.00    Types: Cigarettes    Quit date: 09/22/2008  . Smokeless tobacco: Never Used  . Alcohol use 0.0 oz/week     Comment: OCCASSIONAL     Allergies   Erythromycin   Review of Systems Review of Systems  Constitutional: Positive for chills and fever.  HENT: Positive for congestion. Negative for sore throat.   Eyes: Negative for visual disturbance.  Respiratory: Negative for cough and shortness of breath.   Cardiovascular: Negative for chest pain, palpitations and leg swelling.  Gastrointestinal: Positive for diarrhea. Negative for abdominal pain, nausea and vomiting.  Genitourinary: Negative for dysuria, flank pain, frequency, hematuria, scrotal swelling, testicular pain and urgency.  Musculoskeletal: Positive for arthralgias and myalgias. Negative for neck pain and neck  stiffness.  Skin: Negative for rash.  Neurological: Positive for headaches. Negative for dizziness, syncope, weakness, light-headedness and numbness.  Psychiatric/Behavioral: Negative for sleep disturbance. The patient is not nervous/anxious.      Physical Exam Updated Vital Signs BP 126/74 (BP Location: Left Arm)   Pulse 98   Temp 100.2 F (37.9 C) (Oral)   Resp 20   Ht  (1.702 m)   Wt 78.9 kg (174 lb)   SpO2 100%   BMI 27.25 kg/m   Physical Exam  Constitutional: He is oriented to person, place, and time. He appears well-developed and well-nourished.  Non-toxic appearance. No distress.  HENT:  Head: Normocephalic and atraumatic.  Right Ear: Tympanic membrane, external ear and ear canal normal.  Left Ear: Tympanic membrane, external ear and ear canal normal.  Nose: Nose normal.  Mouth/Throat: Uvula is midline, oropharynx is clear and moist and mucous membranes are normal. No trismus in the jaw. No uvula swelling.  Eyes: Pupils are equal, round, and reactive to light. Conjunctivae are normal. Right eye exhibits no discharge. Left eye exhibits no  discharge.  Neck: Normal range of motion. Neck supple.  No c spine midline tenderness. No paraspinal tenderness. No deformities or step offs noted. Full ROM. Supple. No nuchal rigidity.   Cardiovascular: Normal rate, normal heart sounds and intact distal pulses.  Exam reveals no gallop and no friction rub.   No murmur heard. tachycardia  Pulmonary/Chest: Effort normal and breath sounds normal. No respiratory distress. He has no wheezes. He has no rales. He exhibits no tenderness.  Abdominal: Soft. Bowel sounds are normal. He exhibits no distension. There is tenderness in the epigastric area. There is no rigidity, no rebound, no guarding, no CVA tenderness, no tenderness at McBurney's point and negative Murphy's sign.  Musculoskeletal: Normal range of motion. He exhibits no tenderness.  All joints without erythema or warmth  Lymphadenopathy:    He has no cervical adenopathy.  Neurological: He is alert and oriented to person, place, and time.  Skin: Skin is warm and dry. Capillary refill takes less than 2 seconds. No rash noted.  Psychiatric: His behavior is normal. Judgment and thought content normal.  Nursing note and vitals reviewed.    ED Treatments / Results  Labs (all labs ordered are listed, but only abnormal results are displayed) Labs Reviewed  COMPREHENSIVE METABOLIC PANEL - Abnormal; Notable for the following:       Result Value   Potassium 3.4 (*)    Glucose, Bld 112 (*)    Calcium 8.7 (*)    AST 129 (*)    ALT 252 (*)    Alkaline Phosphatase 146 (*)    Total Bilirubin 1.4 (*)    All other components within normal limits  CBC WITH DIFFERENTIAL/PLATELET - Abnormal; Notable for the following:    WBC 2.8 (*)    Platelets 143 (*)    Lymphs Abs 0.2 (*)    All other components within normal limits  URINALYSIS, ROUTINE W REFLEX MICROSCOPIC - Abnormal; Notable for the following:    Color, Urine AMBER (*)    Bilirubin Urine SMALL (*)    Protein, ur 100 (*)    All other  components within normal limits  CG4 I-STAT (LACTIC ACID) - Abnormal; Notable for the following:    Lactic Acid, Venous 1.96 (*)    All other components within normal limits  CULTURE, BLOOD (ROUTINE X 2)  CULTURE, BLOOD (ROUTINE X 2)  INFLUENZA PANEL BY PCR (TYPE A &  B)  LIPASE, BLOOD  RAPID HIV SCREEN (HIV 1/2 AB+AG)  HEPATITIS PANEL, ACUTE  RPR  I-STAT CG4 LACTIC ACID, ED  CG4 I-STAT (LACTIC ACID)  I-STAT CG4 LACTIC ACID, ED  I-STAT CG4 LACTIC ACID, ED    EKG  EKG Interpretation None       Radiology Dg Chest 2 View  Result Date: 07/06/2017 CLINICAL DATA:  Fever for 2 days, nonproductive cough. EXAM: CHEST  2 VIEW COMPARISON:  09/29/2016. FINDINGS: The heart size and mediastinal contours are within normal limits. Both lungs are clear. The visualized skeletal structures are unremarkable. IMPRESSION: No active cardiopulmonary disease.  Stable chest. Electronically Signed   By: Elsie Stain M.D.   On: 07/06/2017 18:11   US Abdomen Complete  Result Date: 07/07/2017 CLINICAL DATA:  Initial evaluation for elevated LFTs, fever. EXAM: ABDOMEN ULTRASOUND COMPLETE COMPARISON:  None available. FINDINGS: Gallbladder: Gallbladder contracted, somewhat limiting evaluation. No echogenic calculi identified. No internal sludge. Mild diffuse gallbladder wall thickening up to 3.5 mm favored to be related to contraction. No free pericholecystic fluid. No sonographic Murphy sign elicited on exam. Common bile duct: Diameter: 2.7 mm Liver: No focal lesion identified. Within normal limits in parenchymal echogenicity. Portal vein is patent on color Doppler imaging with normal direction of blood flow towards the liver. IVC: No abnormality visualized. Pancreas: Visualized portion unremarkable. Spleen: Size and appearance within normal limits. Right Kidney: Length: 10.8 cm. Echogenicity within normal limits. No mass or hydronephrosis visualized. Left Kidney: Length: 10.3 cm. Echogenicity within normal limits.  No mass or hydronephrosis visualized. Abdominal aorta: Not well visualized due to shadowing from overlying bowel gas. Other findings: None. IMPRESSION: 1. Negative abdominal ultrasound.  No acute abnormality identified. 2. Mild diffuse gallbladder wall thickening, felt to be related to contraction. No cholelithiasis or other sonographic features to suggest acute cholecystitis. No biliary dilatation. Electronically Signed   By: Rise Mu M.D.   On: 07/07/2017 01:49   Ct Abdomen Pelvis W Contrast  Result Date: 07/07/2017 CLINICAL DATA:  Fever, body aches, nausea, diarrhea. Negative flu test. EXAM: CT ABDOMEN AND PELVIS WITH CONTRAST TECHNIQUE: Multidetector CT imaging of the abdomen and pelvis was performed using the standard protocol following bolus administration of intravenous contrast. CONTRAST:  100 mL Isovue-300 COMPARISON:  Ultrasound abdomen 07/07/2017 FINDINGS: Lower chest: Mild dependent changes in the lung bases. Hepatobiliary: Gallbladder is contracted, likely due to nonfasting state. No bile duct dilatation. No focal liver lesions. Pancreas: Unremarkable. No pancreatic ductal dilatation or surrounding inflammatory changes. Spleen: Normal in size without focal abnormality. Adrenals/Urinary Tract: Adrenal glands are unremarkable. Kidneys are normal, without renal calculi, focal lesion, or hydronephrosis. Bladder is unremarkable. Stomach/Bowel: Stomach is within normal limits. Appendix appears normal. Colonic diverticula. No evidence of bowel wall thickening, distention, or inflammatory changes. Vascular/Lymphatic: No significant vascular findings are present. No enlarged abdominal or pelvic lymph nodes. Reproductive: Prostate is unremarkable. Other: Small bilateral inguinal hernias containing fat. Small periumbilical hernia containing fat. Infiltration in the herniated fat at the periumbilical region may indicate fat necrosis. No bowel herniation. No free air or free fluid in the abdomen.  Musculoskeletal: No acute or significant osseous findings. IMPRESSION: 1. Small periumbilical hernia containing fat with possible fat necrosis. 2. Small bilateral inguinal hernias containing fat. 3. No evidence of bowel obstruction or inflammation. Scattered colonic diverticula. Electronically Signed   By: Burman Nieves M.D.   On: 07/07/2017 05:07    Procedures .Critical Care Performed by: Rise Mu Authorized by: Demetrios Loll T   Critical  care provider statement:    Critical care time (minutes):  45   Critical care was necessary to treat or prevent imminent or life-threatening deterioration of the following conditions:  Sepsis   Critical care was time spent personally by me on the following activities:  Development of treatment plan with patient or surrogate, evaluation of patient's response to treatment, examination of patient, obtaining history from patient or surrogate, ordering and performing treatments and interventions, ordering and review of laboratory studies, ordering and review of radiographic studies, pulse oximetry, re-evaluation of patient's condition and review of old charts   (including critical care time)  Medications Ordered in ED Medications  oxymetazoline (AFRIN) 0.05 % nasal spray 2 spray (2 sprays Each Nare Given 07/07/17 0134)  acetaminophen (TYLENOL) tablet 650 mg (650 mg Oral Given 07/06/17 1722)  sodium chloride 0.9 % bolus 1,000 mL (0 mLs Intravenous Stopped 07/07/17 0011)  ibuprofen (ADVIL,MOTRIN) tablet 600 mg (600 mg Oral Given 07/07/17 0029)  sodium chloride 0.9 % bolus 1,000 mL (0 mLs Intravenous Stopped 07/07/17 0134)    And  sodium chloride 0.9 % bolus 500 mL (0 mLs Intravenous Stopped 07/07/17 0221)  piperacillin-tazobactam (ZOSYN) IVPB 3.375 g (0 g Intravenous Stopped 07/07/17 0134)  vancomycin (VANCOCIN) IVPB 1000 mg/200 mL premix (0 mg Intravenous Stopped 07/07/17 0221)  metoCLOPramide (REGLAN) injection 10 mg (10 mg Intravenous Given  07/07/17 0410)  diphenhydrAMINE (BENADRYL) injection 25 mg (25 mg Intravenous Given 07/07/17 0410)  iopamidol (ISOVUE-300) 61 % injection 100 mL (100 mLs Intravenous Contrast Given 07/07/17 0452)     Initial Impression / Assessment and Plan / ED Course  I have reviewed the triage vital signs and the nursing notes.  Pertinent labs & imaging results that were available during my care of the patient were reviewed by me and considered in my medical decision making (see chart for details).     The patient presents to the ED with complaints of fever, generalized weakness and body aches. Patient was seen by PCP with a negative flu test and sent to the ED for evaluation. Patient does report a history of hepatitis as a child. Patient reports some upper abdominal tenderness on exam but denies any specific URI symptoms.  Patient is tachycardic and febrile in the ED. Blood pressures are normal. No hypoxia or tachypnea noted.  On exam patient does have some mild epigastric and right upper quadrant abdominal pain to palpation. No signs of peritonitis. Lungs clear to auscultation bilaterally. No nuchal rigidity.  Labs revealed a leukopenia of 2.3. Thrombocytopenia. Lactic has trended up in the Ed since initial arrival. UA shows no signs of infection. Elevated liver enzymes with no history of same and is not a chronic drinker. Influenza negative. HIV negative. Syphilis, hepatitis panel, blood cultures are pending at this time. Lipase is normal. UA shows no signs of infection.chest x-ray unremarkable.  Given the abdominal tenderness on exam with elevated liver enzymes ultrasound was ordered to rule out cholangitis. This was unremarkable. CT scan was performed that showed no acute abnormalities.  Given patient's tachycardia and fever patient meets SIRS criteria. Blood cultures were obtained and abx were initiated. Patient given 30 mL per KG fluid bolus. Fever and tachycardia improved with Motrin and ibuprofen.     Unknown etiology of patient's symptoms. May be viral in nature. Low suspicion for meningitis given no nuchal rigidity.patient will need admission and further workup.  Spoke with Dr. Clyde Lundborg with hosptial medicine who agrees to admission. Patient remains hemodynamically stable this time. Vital signs  have improved. Blood pressure remains stable. Patient updated on plan of care.  Patient seen and evaluated my attending Dr. Elesa Massed who is agreeable to above plan.  CRITICAL CARE Performed by: Demetrios Loll   Total critical care time: 45 minutes  Critical care time was exclusive of separately billable procedures and treating other patients.  Critical care was necessary to treat or prevent imminent or life-threatening deterioration.  Critical care was time spent personally by me on the following activities: development of treatment plan with patient and/or surrogate as well as nursing, discussions with consultants, evaluation of patient's response to treatment, examination of patient, obtaining history from patient or surrogate, ordering and performing treatments and interventions, ordering and review of laboratory studies, ordering and review of radiographic studies, pulse oximetry and re-evaluation of patient's condition.   Final Clinical Impressions(s) / ED Diagnoses   Final diagnoses:  Fever, unspecified fever cause  SIRS (systemic inflammatory response syndrome) (HCC)  Elevated liver function tests    New Prescriptions New Prescriptions   No medications on file     Wallace Keller 07/07/17 1610

## 2017-07-07 NOTE — Progress Notes (Signed)
Spoke with pt regarding cpap.  Pt states his doctor told him that he doesn't need to wear it after losing over 70 pounds.  Pt has not wore cpap for several years.  Pt was advised that RT is available all night should he change his mind.

## 2017-07-07 NOTE — H&P (Signed)
History and Physical    Tony [name] GEX:[id number] DOB: 1976-07-29 DOA: 07/06/2017  Referring MD/NP/PA:   PCP: Pincus Sanes, MD   Patient coming from:  The patient is coming from home.  At baseline, pt is independent for most of ADL.  Chief Complaint: fever, chills, body aches, headache, abdominal pain  HPI: Tony [name] is a 41 y.o. 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  Patient states that he has been having fever, chills, body aches and headache in the past 2 days. He states that he has chronic nasal congestion, which has not changed. Denies cough, chest pain or shortness of breath. No runny nose or sore throat. No insect bite. No rashes. Pt was seen by PCP and had flu test which was negative today. He states that he has right upper quadrant abdominal pain, which is constant, 4 out of 10 in safety, nonradiating. Denies nausea and vomiting. Patient states that he had one episode of diarrhea on Saturday, which has completely resolved. Denies IV drug use. Denies high risk of sexual activity. No symptoms of UTI or unilateral weak. No neck rigidity. Pt states that he is getting Saxenda injection for body weight control.  ED course: patient was found to have WBC 2.8, lactic acid 1.06, 1.96, lipase 35, negative urinalysis, flu PCR negative, HIV antibody negative,creatinine 1.17, potassium 3.4, abnormal liver function with ALP 146, AST 129, AST 252 and total bilirubin 1.4. Temperature 103.2, tachycardia, tachypnea, oxygen saturation 97% on room air. Chest x-ray negative. Abdominal ultrasound is negative. Pt is Placed on telemetry bed for observation.  # CT abdomen/pelvis showed 1. Small periumbilical hernia containing fat with possible fat necrosis. 2. Small bilateral inguinal hernias containing fat. 3. No  evidence of bowel obstruction or inflammation. Scattered colonic diverticula.  Review of Systems:   General: has fevers, chills, no body weight gain, has poor appetite, has fatigue HEENT: no blurry vision, hearing changes or sore throat Respiratory: no dyspnea, coughing, wheezing CV: no chest pain, no palpitations GI: no nausea, vomiting, has abdominal pain, diarrhea, noconstipation GU: no dysuria, burning on urination, increased urinary frequency, hematuria  Ext: no leg edema Neuro: no unilateral weakness, numbness, or tingling, no vision change or hearing loss Skin: no rash, no skin tear. MSK: No muscle spasm, no deformity, no limitation of range of movement in spin Heme: No easy bruising.  Travel history: No recent long distant travel.  Allergy:  Allergies  Allergen Reactions  . Erythromycin     Pt states med make him sick    Past Medical History:  Diagnosis Date  . Allergic rhinitis   . Anxiety    overlap with depression and ADD  . Asthma   . Hepatitis A   . Hypertension   . Migraine   . Migraines   . OSA on CPAP    Sleep study 12/2011: severe, CPAP    Past Surgical History:  Procedure Laterality Date  . LASIK  2005  . WISDOM TOOTH EXTRACTION      Social History:  reports that he quit smoking about 8 years ago. His smoking use included Cigarettes. He has a 0.72 pack-year smoking history. He has never used smokeless tobacco. He reports that he drinks alcohol. He reports that he does not use drugs.  Family History:  Family History  Problem Relation Age of Onset  .  Hypertension Mother   . Arthritis Father   . Hypertension Father   . Lung cancer Maternal Grandfather   . Diabetes Other   . Emphysema Paternal Grandfather   . Asthma Paternal Grandfather      Prior to Admission medications   Medication Sig Start Date End Date Taking? Authorizing Provider  acetaminophen (TYLENOL) 500 MG tablet Take 500 mg by mouth every 6 (six) hours as needed for mild  pain.   Yes [provider]  acyclovir ointment (ZOVIRAX) 5 % APPLY TOPICALLY EVERY 3 HOURS 07/20/15  Yes Newt Lukes, MD  albuterol (PROVENTIL) (2.5 MG/3ML) 0.083% nebulizer solution Take 3 mLs (2.5 mg total) by nebulization every 6 (six) hours as needed for wheezing or shortness of breath. 09/29/16  Yes Burns, Bobette Mo, MD  amphetamine-dextroamphetamine (ADDERALL XR) 30 MG 24 hr capsule Take 1 capsule (30 mg total) by mouth every morning. 06/08/17  Yes Burns, Bobette Mo, MD  clonazePAM (KLONOPIN) 0.5 MG tablet Take 1 tablet (0.5 mg total) by mouth 2 (two) times daily. 06/08/17  Yes Burns, Bobette Mo, MD  cyclobenzaprine (FLEXERIL) 5 MG tablet Take 1 tablet (5 mg total) by mouth 3 (three) times daily as needed for muscle spasms. 05/07/17  Yes Antoine Primas M, DO  Diclofenac Sodium (PENNSAID) 2 % SOLN Place 2 application onto the skin 2 (two) times daily. 01/25/16  Yes Antoine Primas M, DO  fluticasone (FLONASE) 50 MCG/ACT nasal spray USE TWO SPRAY IN NOSE ONCE DAILY 10/02/16  Yes Burns, Bobette Mo, MD  hydrOXYzine (ATARAX/VISTARIL) 10 MG tablet Take 1 tablet (10 mg total) by mouth 3 (three) times daily as needed. OFFICE VISIT FOR FUTURE REFILLS 04/17/17  Yes Antoine Primas M, DO  ibuprofen (ADVIL,MOTRIN) 200 MG tablet Take 400 mg by mouth every 6 (six) hours as needed for moderate pain.   Yes [provider]  Ibuprofen-Famotidine 800-26.6 MG TABS Take 1 tablet 3 times a day. 05/07/17  Yes Antoine Primas M, DO  indomethacin (INDOCIN) 50 MG capsule TAKE 1 CAPSULE 3 TIMES A   DAY AS NEEDED 12/11/16  Yes Burns, Bobette Mo, MD  levalbuterol [facility] HFA) 45 MCG/ACT inhaler Inhale 2 puffs into the lungs every 6 (six) hours as needed for wheezing. 09/30/16  Yes Burns, Bobette Mo, MD  losartan-hydrochlorothiazide (HYZAAR) 50-12.5 MG tablet Take 1 tablet by mouth daily. 06/08/17  Yes Burns, Bobette Mo, MD  pantoprazole (PROTONIX) 40 MG tablet Take 1 tablet (40 mg total) by mouth daily. 06/17/16  Yes Pincus Sanes,  MD  SAXENDA 18 MG/3ML SOPN INJECT  INTO SKIN DAILY 12/12/16  Yes Pincus Sanes, MD  sertraline (ZOLOFT) 100 MG tablet Take 1 tablet (100 mg total) by mouth daily. 06/08/17  Yes Burns, Bobette Mo, MD  SUMAtriptan (IMITREX) 100 MG tablet Take 1 tablet (100 mg total) by mouth every 2 (two) hours as needed for migraine. May repeat in 2 hours if headache persists or recurs. 12/16/16  Yes Pincus Sanes, MD  tiZANidine (ZANAFLEX) 4 MG tablet TAKE 1 TABLET NIGHTLY 06/29/17  Yes Antoine Primas M, DO  Insulin Pen Needle (BD ULTRA-FINE MICRO PEN NEEDLE) 32G X 6 MM MISC Use with Saxenda 12/17/16   Pincus Sanes, MD    Physical Exam: Vitals:   07/07/17 0236 07/07/17 0309 07/07/17 0415 07/07/17 0415  BP:  117/69 110/67 110/67  Pulse:  (!) 102 100 99  Resp:  (!) Temp:    99.2 F (37.3 C)  TempSrc:  Oral  SpO2:  99%  99%  Weight: 78.9 kg (174 lb)     Height:       General: Not in acute distress HEENT:       Eyes: PERRL, EOMI, no scleral icterus.       ENT: No discharge from the ears and nose, no pharynx injection, no tonsillar enlargement.        Neck: No JVD, no bruit, no mass felt. Heme: No neck lymph node enlargement. Cardiac: S1/S2, RRR, No murmurs, No gallops or rubs. Respiratory:  No rales, wheezing, rhonchi or rubs. GI: Soft, nondistended, tenderness in RUQ, no rebound pain, no organomegaly, BS present. GU: No hematuria Ext: No pitting leg edema bilaterally. 2+DP/PT pulse bilaterally. Musculoskeletal: No joint deformities, No joint redness or warmth, no limitation of ROM in spin. Skin: No rashes.  Neuro: Alert, oriented X3, cranial nerves II-XII grossly intact, moves all extremities normally. Neck is supple. Psych: Patient is not psychotic, no suicidal or hemocidal ideation.  Labs on Admission: I have personally reviewed following labs and imaging studies  CBC:  Recent Labs Lab 07/06/17 1729  WBC 2.8*  NEUTROABS 2.5  HGB 16.1  HCT 45.4  MCV 80.9  PLT 143*   Basic  Metabolic Panel:  Recent Labs Lab 07/06/17 1729  NA 135  K 3.4*  CL 101  CO2 24  GLUCOSE 112*  BUN 11  CREATININE 1.17  CALCIUM 8.7*   GFR: Estimated Creatinine Clearance: 76.9 mL/min (by C-G formula based on SCr of 1.17 mg/dL). Liver Function Tests:  Recent Labs Lab 07/06/17 1729  AST 129*  ALT 252*  ALKPHOS 146*  BILITOT 1.4*  PROT 7.3  ALBUMIN 4.3    Recent Labs Lab 07/06/17 2340  LIPASE 35   No results for input(s): AMMONIA in the last 168 hours. Coagulation Profile: No results for input(s): INR, PROTIME in the last 168 hours. Cardiac Enzymes: No results for input(s): CKTOTAL, CKMB, CKMBINDEX, TROPONINI in the last 168 hours. BNP (last 3 results) No results for input(s): PROBNP in the last 8760 hours. HbA1C: No results for input(s): HGBA1C in the last 72 hours. CBG: No results for input(s): GLUCAP in the last 168 hours. Lipid Profile: No results for input(s): CHOL, HDL, LDLCALC, TRIG, CHOLHDL, LDLDIRECT in the last 72 hours. Thyroid Function Tests: No results for input(s): TSH, T4TOTAL, FREET4, T3FREE, THYROIDAB in the last 72 hours. Anemia Panel: No results for input(s): VITAMINB12, FOLATE, FERRITIN, TIBC, IRON, RETICCTPCT in the last 72 hours. Urine analysis:    Component Value Date/Time   COLORURINE AMBER (A) 07/06/2017 2341   APPEARANCEUR CLEAR 07/06/2017 2341   LABSPEC 1.025 07/06/2017 2341   PHURINE 5.0 07/06/2017 2341   GLUCOSEU NEGATIVE 07/06/2017 2341   GLUCOSEU NEGATIVE 02/06/2015 1022   HGBUR NEGATIVE 07/06/2017 2341   BILIRUBINUR SMALL (A) 07/06/2017 2341   KETONESUR NEGATIVE 07/06/2017 2341   PROTEINUR 100 (A) 07/06/2017 2341   UROBILINOGEN 0.2 02/06/2015 1022   NITRITE NEGATIVE 07/06/2017 2341   LEUKOCYTESUR NEGATIVE 07/06/2017 2341   Sepsis Labs: (procalcitonin:4,lacticidven:4) )No results found for this or any previous visit (from the past 240 hour(s)).   Radiological Exams on Admission: Dg Chest 2 View  Result  Date: 07/06/2017 CLINICAL DATA:  Fever for 2 days, nonproductive cough. EXAM: CHEST  2 VIEW COMPARISON:  09/29/2016. FINDINGS: The heart size and mediastinal contours are within normal limits. Both lungs are clear. The visualized skeletal structures are unremarkable. IMPRESSION: No active cardiopulmonary disease.  Stable chest. Electronically Signed  By: Elsie Stain M.D.   On: 07/06/2017 18:11   US Abdomen Complete  Result Date: 07/07/2017 CLINICAL DATA:  Initial evaluation for elevated LFTs, fever. EXAM: ABDOMEN ULTRASOUND COMPLETE COMPARISON:  None available. FINDINGS: Gallbladder: Gallbladder contracted, somewhat limiting evaluation. No echogenic calculi identified. No internal sludge. Mild diffuse gallbladder wall thickening up to 3.5 mm favored to be related to contraction. No free pericholecystic fluid. No sonographic Murphy sign elicited on exam. Common bile duct: Diameter: 2.7 mm Liver: No focal lesion identified. Within normal limits in parenchymal echogenicity. Portal vein is patent on color Doppler imaging with normal direction of blood flow towards the liver. IVC: No abnormality visualized. Pancreas: Visualized portion unremarkable. Spleen: Size and appearance within normal limits. Right Kidney: Length: 10.8 cm. Echogenicity within normal limits. No mass or hydronephrosis visualized. Left Kidney: Length: 10.3 cm. Echogenicity within normal limits. No mass or hydronephrosis visualized. Abdominal aorta: Not well visualized due to shadowing from overlying bowel gas. Other findings: None. IMPRESSION: 1. Negative abdominal ultrasound.  No acute abnormality identified. 2. Mild diffuse gallbladder wall thickening, felt to be related to contraction. No cholelithiasis or other sonographic features to suggest acute cholecystitis. No biliary dilatation. Electronically Signed   By: Rise Mu M.D.   On: 07/07/2017 01:49   Ct Abdomen Pelvis W Contrast  Result Date: 07/07/2017 CLINICAL DATA:   Fever, body aches, nausea, diarrhea. Negative flu test. EXAM: CT ABDOMEN AND PELVIS WITH CONTRAST TECHNIQUE: Multidetector CT imaging of the abdomen and pelvis was performed using the standard protocol following bolus administration of intravenous contrast. CONTRAST:  100 mL Isovue-300 COMPARISON:  Ultrasound abdomen 07/07/2017 FINDINGS: Lower chest: Mild dependent changes in the lung bases. Hepatobiliary: Gallbladder is contracted, likely due to nonfasting state. No bile duct dilatation. No focal liver lesions. Pancreas: Unremarkable. No pancreatic ductal dilatation or surrounding inflammatory changes. Spleen: Normal in size without focal abnormality. Adrenals/Urinary Tract: Adrenal glands are unremarkable. Kidneys are normal, without renal calculi, focal lesion, or hydronephrosis. Bladder is unremarkable. Stomach/Bowel: Stomach is within normal limits. Appendix appears normal. Colonic diverticula. No evidence of bowel wall thickening, distention, or inflammatory changes. Vascular/Lymphatic: No significant vascular findings are present. No enlarged abdominal or pelvic lymph nodes. Reproductive: Prostate is unremarkable. Other: Small bilateral inguinal hernias containing fat. Small periumbilical hernia containing fat. Infiltration in the herniated fat at the periumbilical region may indicate fat necrosis. No bowel herniation. No free air or free fluid in the abdomen. Musculoskeletal: No acute or significant osseous findings. IMPRESSION: 1. Small periumbilical hernia containing fat with possible fat necrosis. 2. Small bilateral inguinal hernias containing fat. 3. No evidence of bowel obstruction or inflammation. Scattered colonic diverticula. Electronically Signed   By: Burman Nieves M.D.   On: 07/07/2017 05:07    EKG: Independently reviewed. Sinus rhythm, QTC 464, LAD.  Assessment/Plan Principal Problem:   Sepsis (HCC) Active Problems:   Obstructive sleep apnea   Essential hypertension   ADD (attention  deficit disorder)   Migraine   Asthma, mild intermittent   Major depressive disorder, recurrent episode, moderate (HCC)   Esophageal reflux   Abnormal LFTs   Abdominal pain   Hypokalemia   Sepsis (HCC):patient meets criteria for sepsis with WBC 2.8, fever, tachycardia and tachypnea. Lactic acid is normal. Currently hemodynamically stable. Source of infection is not clear. Urinalysis negative chest x-ray negative. Patient seems to have viral infection. Flu PCR negative. HIV antibody negative. Patient was found to have abnormal liver function. The acute hepatitis is potential differential diagnosis. No rashes. No insect  bite. No meningeal signs, neck supple.  -will place on tele bed -check respiratory virus panel -Hepatitis panel -IV vancomycin and Zosyn were started in ED, will continue -Follow up blood culture -check Respiratory virus panel -will get Procalcitonin and trend lactic acid levels per sepsis protocol. -IVF: 3L of NS bolus in ED, followed by 125 cc/h   Abnormal LFTs and RUQ AP: pt has abnormal liver function tests. Hepatitis is a potential differential diagnosis. CT of abd/pelvis showed small periumbilical hernia containing fat with possible fat necrosis and small bilateral inguinal hernias containing fat. These findings do not seem to explain his right upper quadrant abdominal pain and abnormal liver functions test. Patient denies heavy alcohol drinking. -f/u hepatitis panel -Check Tylenol level -f/u RPR, HIV RNA -prn zofran for nausea and morphine for pain  HTN: -hold Hyzarr since pt is at risk of developing hypotension secondary to sepsis -IVF hydralazine when necessary  Hypokalemia: K= 3.4 on admission. - Repleted  Depression and anxiety: Stable, no suicidal or homicidal ideations. -Continue home medications: Zoloft, Klonopin  Migraine:  -prn Imitrex  Asthma, mild intermittent: stable -prn albuterol nebs  GERD: -Protonix  OSA: -CPAP  ADD (attention  deficit disorder): -continue home adderall    DVT ppx: SQ Lovenox Code Status: Full code Family Communication: None at bed side.           Disposition Plan:  Anticipate discharge back to previous home environment Consults called:  none Admission status: Obs / tele   Date of Service 07/07/2017    Lorretta Harp [facility] Pager [phone]  If 7PM-7AM, please contact night-coverage [email or web address] Password [email or web address] 07/07/2017, 6:22 AM

## 2017-07-07 NOTE — ED Provider Notes (Signed)
Medical screening examination/treatment/procedure(s) were conducted as a shared visit with non-physician practitioner(s) and myself.  I personally evaluated the patient during the encounter.   EKG Interpretation None      Pt is a 41 y.o. Male who has had history of hepatitis as a child who presents emergency department with fevers, generalized weakness and body aches. Patient febrile here and tachycardic. No hypotension. He has some upper abdominal tenderness on exam. Labs reveal leukopenia, thrombocytopenia, elevated lactate. He has elevated liver function tests. HIV, flu swab negative. Blood cultures, hepatitis panel, syphilis testing pending. Urine shows no sign of infection. Chest x-ray clear. Ultrasound of his gallbladder shows no acute abdomen. CT scan shows no acute abnormality.  Patient given IV fluids and broad-spectrum antibiotics as he meets SIRs criteria. He will need admission.   Ward, Layla Maw, DO 07/07/17 [phone]

## 2017-07-08 ENCOUNTER — Inpatient Hospital Stay (HOSPITAL_COMMUNITY): Payer: BC Managed Care – PPO

## 2017-07-08 DIAGNOSIS — D696 Thrombocytopenia, unspecified: Secondary | ICD-10-CM

## 2017-07-08 DIAGNOSIS — R945 Abnormal results of liver function studies: Secondary | ICD-10-CM

## 2017-07-08 DIAGNOSIS — D72819 Decreased white blood cell count, unspecified: Secondary | ICD-10-CM

## 2017-07-08 DIAGNOSIS — E876 Hypokalemia: Secondary | ICD-10-CM

## 2017-07-08 DIAGNOSIS — A419 Sepsis, unspecified organism: Secondary | ICD-10-CM

## 2017-07-08 DIAGNOSIS — I10 Essential (primary) hypertension: Secondary | ICD-10-CM

## 2017-07-08 LAB — COMPREHENSIVE METABOLIC PANEL
ALT: 181 U/L — ABNORMAL HIGH (ref 17–63)
ANION GAP: 7 (ref 5–15)
AST: 115 U/L — AB (ref 15–41)
Albumin: 3.2 g/dL — ABNORMAL LOW (ref 3.5–5.0)
Alkaline Phosphatase: 92 U/L (ref 38–126)
BUN: 15 mg/dL (ref 6–20)
CHLORIDE: 103 mmol/L (ref 101–111)
CO2: 26 mmol/L (ref 22–32)
Calcium: 7.9 mg/dL — ABNORMAL LOW (ref 8.9–10.3)
Creatinine, Ser: 1.47 mg/dL — ABNORMAL HIGH (ref 0.61–1.24)
GFR, EST NON AFRICAN AMERICAN: 57 mL/min — AB (ref >60)
Glucose, Bld: 94 mg/dL (ref 65–99)
POTASSIUM: 3.3 mmol/L — AB (ref 3.5–5.1)
Sodium: 136 mmol/L (ref 135–145)
TOTAL PROTEIN: 5.6 g/dL — AB (ref 6.5–8.1)
Total Bilirubin: 3.5 mg/dL — ABNORMAL HIGH (ref 0.3–1.2)

## 2017-07-08 LAB — HEPATITIS PANEL, ACUTE
HEP A IGM: NEGATIVE
HEP B C IGM: NEGATIVE
HEP B S AG: NEGATIVE

## 2017-07-08 LAB — RESPIRATORY PANEL BY PCR
ADENOVIRUS-RVPPCR: NOT DETECTED
Bordetella pertussis: NOT DETECTED
CORONAVIRUS NL63-RVPPCR: NOT DETECTED
CORONAVIRUS OC43-RVPPCR: NOT DETECTED
Chlamydophila pneumoniae: NOT DETECTED
Coronavirus 229E: NOT DETECTED
Coronavirus HKU1: NOT DETECTED
INFLUENZA A-RVPPCR: NOT DETECTED
INFLUENZA B-RVPPCR: NOT DETECTED
METAPNEUMOVIRUS-RVPPCR: NOT DETECTED
MYCOPLASMA PNEUMONIAE-RVPPCR: NOT DETECTED
PARAINFLUENZA VIRUS 1-RVPPCR: NOT DETECTED
PARAINFLUENZA VIRUS 4-RVPPCR: NOT DETECTED
Parainfluenza Virus 2: NOT DETECTED
Parainfluenza Virus 3: NOT DETECTED
RESPIRATORY SYNCYTIAL VIRUS-RVPPCR: NOT DETECTED
Rhinovirus / Enterovirus: NOT DETECTED

## 2017-07-08 LAB — CBC
HEMATOCRIT: 38.3 % — AB (ref 39.0–52.0)
HEMOGLOBIN: 13.3 g/dL (ref 13.0–17.0)
MCH: 28.2 pg (ref 26.0–34.0)
MCHC: 34.7 g/dL (ref 30.0–36.0)
MCV: 81.3 fL (ref 78.0–100.0)
Platelets: 76 10*3/uL — ABNORMAL LOW (ref 150–400)
RBC: 4.71 MIL/uL (ref 4.22–5.81)
RDW: 14.1 % (ref 11.5–15.5)
WBC: 1.3 10*3/uL — AB (ref 4.0–10.5)

## 2017-07-08 LAB — HIV-1 RNA QUANT-NO REFLEX-BLD
HIV 1 RNA Quant: <20 copies/mL
LOG10 HIV-1 RNA: UNDETERMINED log10copy/mL

## 2017-07-08 LAB — VANCOMYCIN, TROUGH: VANCOMYCIN TR: 15 ug/mL (ref 15–20)

## 2017-07-08 LAB — GLUCOSE, CAPILLARY
GLUCOSE-CAPILLARY: 82 mg/dL (ref 65–99)
Glucose-Capillary: 62 mg/dL — ABNORMAL LOW (ref 65–99)

## 2017-07-08 MED ORDER — LORAZEPAM 0.5 MG PO TABS
0.5000 mg | ORAL_TABLET | Freq: Two times a day (BID) | ORAL | Status: AC | PRN
  Administered 2017-07-10: 0.5 mg via ORAL
  Filled 2017-07-08: qty 1

## 2017-07-08 MED ORDER — LORAZEPAM 2 MG/ML IJ SOLN
1.0000 mg | Freq: Once | INTRAMUSCULAR | Status: AC
  Administered 2017-07-08: 1 mg via INTRAVENOUS
  Filled 2017-07-08: qty 1

## 2017-07-08 MED ORDER — POTASSIUM CHLORIDE CRYS ER 20 MEQ PO TBCR
40.0000 meq | EXTENDED_RELEASE_TABLET | Freq: Once | ORAL | Status: AC
  Administered 2017-07-08: 40 meq via ORAL
  Filled 2017-07-08: qty 2

## 2017-07-08 MED ORDER — LORAZEPAM 2 MG/ML IJ SOLN
0.5000 mg | Freq: Once | INTRAMUSCULAR | Status: AC | PRN
  Administered 2017-07-08: 0.5 mg via INTRAVENOUS
  Filled 2017-07-08: qty 1

## 2017-07-08 NOTE — Progress Notes (Signed)
CRITICAL VALUE ALERT  Critical Value:  WBC 1.3  Date & Time Notied:  07/08/2017 0740  Provider Notified: 10/170/2018 MD Rito EhrlichKrishnan [phone]  Orders Received/Actions taken: Awaiting orders

## 2017-07-08 NOTE — Progress Notes (Signed)
Pharmacy Antibiotic Note  Tony MustardStacy J Lanuza is a 41 y.o. male admitted on 07/06/2017 with complaint of fever.  Vancomycin and zosyn started on admission for suspected sepsis from unknown source.  Today, 07/08/17  - Day 2 of antibiotics - Vancomycin trough therapeutic at 15 (goal 15-20) after 3 doses of 1g q12h.  -  Scr trending up - coadministration of vancomycin and  Zosyn can be nephrotoxic. Patient received a couple of doses of ibuprofen on 10/16 but NSAID has been discontinued. This may have contributed to renal insufficiency.  - WBC low, currently afebrile.   Plan: - Continue vancomycin 1g q12h - Continue zosyn 3.375 g q8h (infuse over 4 hours)  - Monitor renal function - F/u cultures, plan for de-escalation.   Height: 5\' 7"  (170.2 cm) Weight: 174 lb (78.9 kg) IBW/kg (Calculated) : 66.1  Temp (24hrs), Avg:99 F (37.2 C), Min:97.7 F (36.5 C), Max:100.3 F (37.9 C)   Recent Labs Lab 07/06/17 1729 07/06/17 1745 07/07/17 0002 07/07/17 0645 07/08/17 0618 07/08/17 1220  WBC 2.8*  --   --   --  1.3*  --   CREATININE 1.17  --   --  1.23 1.47*  --   LATICACIDVEN  --  1.06 1.96*  --   --   --   VANCOTROUGH  --   --   --   --   --  15    Estimated Creatinine Clearance: 61.2 mL/min (A) (by C-G formula based on SCr of 1.47 mg/dL (H)).    Allergies  Allergen Reactions  . Erythromycin     Pt states med make him sick    Antimicrobials this admission: 10/16 zosyn >>  10/16 vancomycin >>   Dose adjustments this admission: 10/17 at 12/20 = 15 (on vancomycin 1 gm q12)  Microbiology results: 10/15 Bcx x2: 10/16 resp panel:  10/16 hepatitis panel:  10/16 influ panel: neg   Thank you for allowing pharmacy to be a part of this patient's care.  Laverta BaltimoreMary Elnor Renovato 07/08/2017 1:26 PM

## 2017-07-08 NOTE — Progress Notes (Addendum)
Pt refuses CPAP  RT to monitor and assess as needed.  

## 2017-07-08 NOTE — Progress Notes (Signed)
[facility] PROGRESS NOTE  ELIM ECONOMOU DTO:[id number] DOB: [birth year] DOA: 07/06/2017  PCP: Binnie Rail, MD  Brief History/Interval Summary: 41 year old Caucasian male with past medical history of hypertension, asthma, GERD, depression, with history of obstructive sleep apnea on CPAP, presented with fever, chills, body aches, headaches and abdominal pain. Symptoms have been ongoing for 2 days. Patient was found to have abnormal LFTs. He was hospitalized for further management.  Reason for Visit: transaminitis  Consultants: none yet  Procedures: none  Antibiotics: Currently on vancomycin and Zosyn  Subjective/Interval History: Patient feels slightly better compared to how he was 2 days ago. However, he remains weak, fatigued. Continues to have body aches. Continues to have some abdominal discomfort. Denies any nausea, vomiting. Had one episode of loose stool this morning.  ROS: denies any skin rashes. Some joint pains but no joint swelling.  Objective:  Vital Signs  Vitals:   07/07/17 0929 07/07/17 1325 07/07/17 2059 07/08/17 0650  BP:  115/73 130/80 (!) 93/56  Pulse:  87 (!) 102 66  Resp:  '18 18 18  ' Temp: (!) 101 F (38.3 C) 98.5 F (36.9 C) 100.3 F (37.9 C) 97.7 F (36.5 C)  TempSrc: Oral Oral Oral Oral  SpO2:  100% 100% 100%  Weight:      Height:        Intake/Output Summary (Last 24 hours) at 07/08/17 1045 Last data filed at 07/08/17 0700  Gross per 24 hour  Intake           1087.5 ml  Output              500 ml  Net            587.5 ml   Filed Weights   07/06/17 1714 07/07/17 0236  Weight: 78.9 kg (174 lb) 78.9 kg (174 lb)    General appearance: alert, cooperative, appears stated age and no distress Head: Normocephalic, without obvious abnormality, atraumatic Resp: clear to auscultation bilaterally Cardio: regular rate and rhythm, S1, S2 normal, no murmur, click, rub or gallop GI: abdomen is soft. Tender in the epigastric area as well as  right upper quadrant without any rebound, rigidity or guarding. No masses or organomegaly. Bowel sounds are present. Extremities: extremities normal, atraumatic, no cyanosis or edema Skin: Skin color, texture, turgor normal. No rashes or lesions Neurologic: no focal deficits  Lab Results:  Data Reviewed: I have personally reviewed following labs and imaging studies  CBC:  Recent Labs Lab 07/06/17 1729 07/08/17 0618  WBC 2.8* 1.3*  NEUTROABS 2.5  --   HGB 16.1 13.3  HCT 45.4 38.3*  MCV 80.9 81.3  PLT 143* 76*    Basic Metabolic Panel:  Recent Labs Lab 07/06/17 1729 07/07/17 0645 07/08/17 0618  NA 135  --  136  K 3.4*  --  3.3*  CL 101  --  103  CO2 24  --  26  GLUCOSE 112*  --  94  BUN 11  --  15  CREATININE 1.17 1.23 1.47*  CALCIUM 8.7*  --  7.9*    GFR: Estimated Creatinine Clearance: 61.2 mL/min (A) (by C-G formula based on SCr of 1.47 mg/dL (H)).  Liver Function Tests:  Recent Labs Lab 07/06/17 1729 07/08/17 0618  AST 129* 115*  ALT 252* 181*  ALKPHOS 146* 92  BILITOT 1.4* 3.5*  PROT 7.3 5.6*  ALBUMIN 4.3 3.2*     Recent Labs Lab 07/06/17 2340  LIPASE 35   Coagulation Profile:  Recent Labs Lab 07/07/17 0645  INR 1.41    CBG:  Recent Labs Lab 07/08/17 0754  GLUCAP 62*     Radiology Studies: Dg Chest 2 View  Result Date: 07/06/2017 CLINICAL DATA:  Fever for 2 days, nonproductive cough. EXAM: CHEST  2 VIEW COMPARISON:  09/29/2016. FINDINGS: The heart size and mediastinal contours are within normal limits. Both lungs are clear. The visualized skeletal structures are unremarkable. IMPRESSION: No active cardiopulmonary disease.  Stable chest. Electronically Signed   By: Staci Righter M.D.   On: 07/06/2017 18:11   US Abdomen Complete  Result Date: 07/07/2017 CLINICAL DATA:  Initial evaluation for elevated LFTs, fever. EXAM: ABDOMEN ULTRASOUND COMPLETE COMPARISON:  None available. FINDINGS: Gallbladder: Gallbladder contracted,  somewhat limiting evaluation. No echogenic calculi identified. No internal sludge. Mild diffuse gallbladder wall thickening up to 3.5 mm favored to be related to contraction. No free pericholecystic fluid. No sonographic Murphy sign elicited on exam. Common bile duct: Diameter: 2.7 mm Liver: No focal lesion identified. Within normal limits in parenchymal echogenicity. Portal vein is patent on color Doppler imaging with normal direction of blood flow towards the liver. IVC: No abnormality visualized. Pancreas: Visualized portion unremarkable. Spleen: Size and appearance within normal limits. Right Kidney: Length: 10.8 cm. Echogenicity within normal limits. No mass or hydronephrosis visualized. Left Kidney: Length: 10.3 cm. Echogenicity within normal limits. No mass or hydronephrosis visualized. Abdominal aorta: Not well visualized due to shadowing from overlying bowel gas. Other findings: None. IMPRESSION: 1. Negative abdominal ultrasound.  No acute abnormality identified. 2. Mild diffuse gallbladder wall thickening, felt to be related to contraction. No cholelithiasis or other sonographic features to suggest acute cholecystitis. No biliary dilatation. Electronically Signed   By: Jeannine Boga M.D.   On: 07/07/2017 01:49   Ct Abdomen Pelvis W Contrast  Result Date: 07/07/2017 CLINICAL DATA:  Fever, body aches, nausea, diarrhea. Negative flu test. EXAM: CT ABDOMEN AND PELVIS WITH CONTRAST TECHNIQUE: Multidetector CT imaging of the abdomen and pelvis was performed using the standard protocol following bolus administration of intravenous contrast. CONTRAST:  100 mL Isovue-300 COMPARISON:  Ultrasound abdomen 07/07/2017 FINDINGS: Lower chest: Mild dependent changes in the lung bases. Hepatobiliary: Gallbladder is contracted, likely due to nonfasting state. No bile duct dilatation. No focal liver lesions. Pancreas: Unremarkable. No pancreatic ductal dilatation or surrounding inflammatory changes. Spleen: Normal  in size without focal abnormality. Adrenals/Urinary Tract: Adrenal glands are unremarkable. Kidneys are normal, without renal calculi, focal lesion, or hydronephrosis. Bladder is unremarkable. Stomach/Bowel: Stomach is within normal limits. Appendix appears normal. Colonic diverticula. No evidence of bowel wall thickening, distention, or inflammatory changes. Vascular/Lymphatic: No significant vascular findings are present. No enlarged abdominal or pelvic lymph nodes. Reproductive: Prostate is unremarkable. Other: Small bilateral inguinal hernias containing fat. Small periumbilical hernia containing fat. Infiltration in the herniated fat at the periumbilical region may indicate fat necrosis. No bowel herniation. No free air or free fluid in the abdomen. Musculoskeletal: No acute or significant osseous findings. IMPRESSION: 1. Small periumbilical hernia containing fat with possible fat necrosis. 2. Small bilateral inguinal hernias containing fat. 3. No evidence of bowel obstruction or inflammation. Scattered colonic diverticula. Electronically Signed   By: Lucienne Capers M.D.   On: 07/07/2017 05:07     Medications:  Scheduled: . acyclovir ointment   Topical Q3H while awake  . amphetamine-dextroamphetamine  30 mg Oral Daily  . clonazePAM  0.5 mg Oral BID  . diclofenac sodium  2 g Topical BID  . enoxaparin (LOVENOX) injection  40 mg Subcutaneous Q24H  . fluticasone  1 spray Each Nare Daily  . oxymetazoline  2 spray Each Nare BID  . pantoprazole  40 mg Oral Daily  . potassium chloride  20 mEq Oral Once  . sertraline  100 mg Oral Daily  . tiZANidine  4 mg Oral QHS   Continuous: . sodium chloride 125 mL/hr at 07/08/17 0916  . piperacillin-tazobactam (ZOSYN)  IV 3.375 g (07/08/17 0916)   KKO:[id number], albuterol, cyclobenzaprine, hydrALAZINE, LORazepam, LORazepam, morphine injection, ondansetron, SUMAtriptan, zolpidem  Assessment/Plan:  Principal Problem:   Sepsis ([place]) Active Problems:    Obstructive sleep apnea   Essential hypertension   ADD (attention deficit disorder)   Migraine   Asthma, mild intermittent   Major depressive disorder, recurrent episode, moderate (HCC)   Esophageal reflux   Abnormal LFTs   Abdominal pain   Hypokalemia    Sepsis Patient met criteria for sepsis with leukopenia, fever, tachycardia and tachypnea. Lactic acid level was normal. Source of infection is not clear. This could be some kind of viral syndrome. Flu PCR negative. HIV negative. Hepatitis panel negative. Patient does not have any skin rashes. No joint swelling. Continue to monitor closely for now. Follow up on blood cultures. He is on vancomycin and Zosyn. De-escalate antibiotics as soon as possible. Pro calcitonin level 0.47. WBC noted to be lower today.  Transaminitis with hyperbilirubinemia. LFTs stable. Her bilirubin was noted to be higher today. CT scan and ultrasound did not show any acute findings. Patient continues to have epigastric and right upper quadrant tenderness. Patient denies any alcohol consumption. Hepatitis panel is negative. Tylenol level less than 10. Proceed with MRCP.  Leukopenia and thrombocytopenia. Likely due to same problem causing sepsis and transaminitis. Continue to monitor these counts. Stop the Lovenox for now.  History of essential hypertension. Continue to monitor blood pressures closely.  Hypokalemia. Additional dose of potassium to be given today.  History of depression and anxiety/ADD Continue home medications.  History of mild intermittent asthma. Stable.  History of obstructive sleep apnea. Has not used CPAP in several years since he lost significant amount of weight and was told by his PCP that he does not need it anymore.   DVT Prophylaxis: Lovenox to be changed to SCD's   Code Status: full code  Family Communication: discussed with the patient  Disposition Plan: management as outlined above.    LOS: 1 day    [facility] Pager [phone] 07/08/2017, 10:45 AM  If 7PM-7AM, please contact night-coverage at [email or web address], password [facility]

## 2017-07-09 LAB — CBC WITH DIFFERENTIAL/PLATELET
Basophils Absolute: 0 10*3/uL (ref 0.0–0.1)
Basophils Relative: 1 %
EOS ABS: 0 10*3/uL (ref 0.0–0.7)
Eosinophils Relative: 1 %
HCT: 38 % — ABNORMAL LOW (ref 39.0–52.0)
HEMOGLOBIN: 13.3 g/dL (ref 13.0–17.0)
LYMPHS ABS: 0.5 10*3/uL — AB (ref 0.7–4.0)
LYMPHS PCT: 26 %
MCH: 28.1 pg (ref 26.0–34.0)
MCHC: 35 g/dL (ref 30.0–36.0)
MCV: 80.2 fL (ref 78.0–100.0)
MONOS PCT: 11 %
Monocytes Absolute: 0.2 10*3/uL (ref 0.1–1.0)
NEUTROS PCT: 61 %
Neutro Abs: 1.3 10*3/uL — ABNORMAL LOW (ref 1.7–7.7)
Platelets: 68 10*3/uL — ABNORMAL LOW (ref 150–400)
RBC: 4.74 MIL/uL (ref 4.22–5.81)
RDW: 13.9 % (ref 11.5–15.5)
WBC: 2.1 10*3/uL — ABNORMAL LOW (ref 4.0–10.5)

## 2017-07-09 LAB — COMPREHENSIVE METABOLIC PANEL
ALT: 160 U/L — AB (ref 17–63)
AST: 102 U/L — AB (ref 15–41)
Albumin: 3.4 g/dL — ABNORMAL LOW (ref 3.5–5.0)
Alkaline Phosphatase: 109 U/L (ref 38–126)
Anion gap: 11 (ref 5–15)
BUN: 17 mg/dL (ref 6–20)
CHLORIDE: 101 mmol/L (ref 101–111)
CO2: 21 mmol/L — AB (ref 22–32)
CREATININE: 1.32 mg/dL — AB (ref 0.61–1.24)
Calcium: 8.3 mg/dL — ABNORMAL LOW (ref 8.9–10.3)
GFR calc non Af Amer: >60 mL/min (ref >60)
Glucose, Bld: 91 mg/dL (ref 65–99)
POTASSIUM: 3.9 mmol/L (ref 3.5–5.1)
SODIUM: 133 mmol/L — AB (ref 135–145)
Total Bilirubin: 4.7 mg/dL — ABNORMAL HIGH (ref 0.3–1.2)
Total Protein: 5.9 g/dL — ABNORMAL LOW (ref 6.5–8.1)

## 2017-07-09 MED ORDER — LOPERAMIDE HCL 2 MG PO CAPS
2.0000 mg | ORAL_CAPSULE | Freq: Once | ORAL | Status: AC
  Administered 2017-07-09: 2 mg via ORAL
  Filled 2017-07-09: qty 1

## 2017-07-09 NOTE — Progress Notes (Signed)
Entered room and offered pt. CPAP for the night.  He states that he does not want it and has told us that for the last three nights.  Explained that we just liked to offer to be sure that patient had CPAP available if needed.

## 2017-07-09 NOTE — Progress Notes (Signed)
[facility] PROGRESS NOTE  Tony [name] MWN:[id number] DOB: 08-Jul-1976 DOA: 07/06/2017  PCP: Binnie Rail, MD  Brief History/Interval Summary: 41 year old Caucasian male with past medical history of hypertension, asthma, GERD, depression, with history of obstructive sleep apnea on CPAP, presented with fever, chills, body aches, headaches and abdominal pain. Symptoms have been ongoing for 2 days. Patient was found to have abnormal LFTs. He was hospitalized for further management.  Reason for Visit: transaminitis  Consultants: none yet  Procedures: none  Antibiotics: Initially started on vancomycin and Zosyn Vancomycin was discontinued on 10/17   Subjective/Interval History: Patient is slowly improving. Denies any abdominal pain today. No cough. No nausea, vomiting or diarrhea.  ROS: continues to have body aches but improving.  Objective:  Vital Signs  Vitals:   07/08/17 1513 07/08/17 2045 07/09/17 0513 07/09/17 0829  BP: 116/69 119/76 112/75 125/81  Pulse: 86 79 85 76  Resp: '18 18 18 16  ' Temp: 98.3 F (36.8 C) 98.4 F (36.9 C) 97.7 F (36.5 C) 99.4 F (37.4 C)  TempSrc: Oral Oral Oral Oral  SpO2: 97% 100% 100% 100%  Weight:      Height:       No intake or output data in the 24 hours ending 07/09/17 1244 Filed Weights   07/06/17 1714 07/07/17 0236  Weight: 78.9 kg (174 lb) 78.9 kg (174 lb)    General appearance: awake alert. In no distress Resp: clear to auscultation bilaterally Cardio: S1, S2 is normal, regular. No rubs, murmurs, or bruit GI: abdomen remained soft. Nontender today. No masses or organomegaly. Bowel sounds are present. Extremities: no edema Neurologic: no focal deficits  Lab Results:  Data Reviewed: I have personally reviewed following labs and imaging studies  CBC:  Recent Labs Lab 07/06/17 1729 07/08/17 0618 07/09/17 0706  WBC 2.8* 1.3* 2.1*  NEUTROABS 2.5  --  1.3*  HGB 16.1 13.3 13.3  HCT 45.4 38.3* 38.0*  MCV 80.9  81.3 80.2  PLT 143* 76* 68*    Basic Metabolic Panel:  Recent Labs Lab 07/06/17 1729 07/07/17 0645 07/08/17 0618 07/09/17 0706  NA 135  --  136 133*  K 3.4*  --  3.3* 3.9  CL 101  --  103 101  CO2 24  --  26 21*  GLUCOSE 112*  --  94 91  BUN 11  --  15 17  CREATININE 1.17 1.23 1.47* 1.32*  CALCIUM 8.7*  --  7.9* 8.3*    GFR: Estimated Creatinine Clearance: 68.2 mL/min (A) (by C-G formula based on SCr of 1.32 mg/dL (H)).  Liver Function Tests:  Recent Labs Lab 07/06/17 1729 07/08/17 0618 07/09/17 0706  AST 129* 115* 102*  ALT 252* 181* 160*  ALKPHOS 146* 92 109  BILITOT 1.4* 3.5* 4.7*  PROT 7.3 5.6* 5.9*  ALBUMIN 4.3 3.2* 3.4*     Recent Labs Lab 07/06/17 2340  LIPASE 35   Coagulation Profile:  Recent Labs Lab 07/07/17 0645  INR 1.41    CBG:  Recent Labs Lab 07/08/17 0754 07/08/17 0839  GLUCAP 62* 82     Radiology Studies: Mr 3d Recon At Scanner  Result Date: 07/08/2017 CLINICAL DATA:  Elevated liver function tests.  Fever. EXAM: MRI ABDOMEN WITHOUT AND WITH CONTRAST (INCLUDING MRCP) TECHNIQUE: Multiplanar multisequence MR imaging of the abdomen was performed both before and after the administration of intravenous contrast. Heavily T2-weighted images of the biliary and pancreatic ducts were obtained, and three-dimensional MRCP images were rendered by post  processing. CONTRAST:  15 mL MultiHance COMPARISON:  CT on 07/07/2017 FINDINGS: Image degradation by motion artifact noted. Lower chest: No acute findings. Hepatobiliary: No hepatic masses identified. Gallbladder is nearly completely collapsed. No evidence of biliary ductal dilatation, with common bile duct measuring 5 mm. No evidence of choledocholithiasis. Pancreas:  No mass or inflammatory changes. Spleen:  Within normal limits in size and appearance. Adrenals/Urinary Tract: No masses identified. No evidence of hydronephrosis. Stomach/Bowel: Visualized portions within the abdomen are  unremarkable. Vascular/Lymphatic: No pathologically enlarged lymph nodes identified. No abdominal aortic aneurysm. Other: Stable small paraumbilical ventral hernia containing only fat. Musculoskeletal:  No suspicious bone lesions identified. IMPRESSION: Image degradation by motion artifact. No hepatobiliary abnormality or acute findings. Stable small paraumbilical hernia containing only fat. Electronically Signed   By: Earle Gell M.D.   On: 07/08/2017 15:18   Mr Abdomen Mrcp Moise Boring Contast  Result Date: 07/08/2017 CLINICAL DATA:  Elevated liver function tests.  Fever. EXAM: MRI ABDOMEN WITHOUT AND WITH CONTRAST (INCLUDING MRCP) TECHNIQUE: Multiplanar multisequence MR imaging of the abdomen was performed both before and after the administration of intravenous contrast. Heavily T2-weighted images of the biliary and pancreatic ducts were obtained, and three-dimensional MRCP images were rendered by post processing. CONTRAST:  15 mL MultiHance COMPARISON:  CT on 07/07/2017 FINDINGS: Image degradation by motion artifact noted. Lower chest: No acute findings. Hepatobiliary: No hepatic masses identified. Gallbladder is nearly completely collapsed. No evidence of biliary ductal dilatation, with common bile duct measuring 5 mm. No evidence of choledocholithiasis. Pancreas:  No mass or inflammatory changes. Spleen:  Within normal limits in size and appearance. Adrenals/Urinary Tract: No masses identified. No evidence of hydronephrosis. Stomach/Bowel: Visualized portions within the abdomen are unremarkable. Vascular/Lymphatic: No pathologically enlarged lymph nodes identified. No abdominal aortic aneurysm. Other: Stable small paraumbilical ventral hernia containing only fat. Musculoskeletal:  No suspicious bone lesions identified. IMPRESSION: Image degradation by motion artifact. No hepatobiliary abnormality or acute findings. Stable small paraumbilical hernia containing only fat. Electronically Signed   By: Earle Gell  M.D.   On: 07/08/2017 15:18     Medications:  Scheduled: . acyclovir ointment   Topical Q3H while awake  . amphetamine-dextroamphetamine  30 mg Oral Daily  . clonazePAM  0.5 mg Oral BID  . diclofenac sodium  2 g Topical BID  . fluticasone  1 spray Each Nare Daily  . oxymetazoline  2 spray Each Nare BID  . pantoprazole  40 mg Oral Daily  . sertraline  100 mg Oral Daily  . tiZANidine  4 mg Oral QHS   Continuous: . sodium chloride 100 mL/hr at 07/08/17 2117  . piperacillin-tazobactam (ZOSYN)  IV 3.375 g (07/09/17 0955)   VZC:HYIFOYDXAJOIN, albuterol, cyclobenzaprine, hydrALAZINE, LORazepam, morphine injection, ondansetron, SUMAtriptan, zolpidem  Assessment/Plan:  Principal Problem:   Sepsis ([place]) Active Problems:   Obstructive sleep apnea   Essential hypertension   ADD (attention deficit disorder)   Migraine   Asthma, mild intermittent   Major depressive disorder, recurrent episode, moderate (HCC)   Esophageal reflux   Abnormal LFTs   Abdominal pain   Hypokalemia    Sepsis of unknown source/possible viral syndrome Patient met criteria for sepsis with leukopenia, fever, tachycardia and tachypnea. Lactic acid level was normal. Source of infection is not clear. This could be some kind of viral syndrome. Flu PCR negative. HIV negative. Hepatitis panel negative. Patient does not have any skin rashes. No joint swelling. Blood cultures are negative. Pro-calcitonin level was low. Vancomycin was discontinued yesterday. Stop  Zosyn as well.   Transaminitis with hyperbilirubinemia. AST, ALT improving. Bilirubin noted to be slightly higher today. MRCP was done which does not show any concerning findings. Hepatitis panel is negative. Tylenol level was less than 10.  Leukopenia and thrombocytopenia. Likely due to same problem causing sepsis and transaminitis. Stable counts.  History of essential hypertension. Continue to monitor blood pressures  closely.  Hypokalemia. Repleted  History of depression and anxiety/ADD Continue home medications.  History of mild intermittent asthma. Stable.  History of obstructive sleep apnea. Has not used CPAP in several years since he lost significant amount of weight and was told by his PCP that he does not need it anymore.   DVT Prophylaxis: SCDs Code Status: full code  Family Communication: discussed with the patient  Disposition Plan: Management as outlined above. Mobilize.    LOS: 2 days   [facility] Pager [phone] 07/09/2017, 12:44 PM  If 7PM-7AM, please contact night-coverage at [email or web address], password [facility]

## 2017-07-10 LAB — COMPREHENSIVE METABOLIC PANEL
ALT: 153 U/L — ABNORMAL HIGH (ref 17–63)
AST: 115 U/L — AB (ref 15–41)
Albumin: 3.1 g/dL — ABNORMAL LOW (ref 3.5–5.0)
Alkaline Phosphatase: 129 U/L — ABNORMAL HIGH (ref 38–126)
Anion gap: 8 (ref 5–15)
BUN: 10 mg/dL (ref 6–20)
CHLORIDE: 101 mmol/L (ref 101–111)
CO2: 25 mmol/L (ref 22–32)
Calcium: 8.3 mg/dL — ABNORMAL LOW (ref 8.9–10.3)
Creatinine, Ser: 1 mg/dL (ref 0.61–1.24)
Glucose, Bld: 98 mg/dL (ref 65–99)
POTASSIUM: 3.5 mmol/L (ref 3.5–5.1)
SODIUM: 134 mmol/L — AB (ref 135–145)
Total Bilirubin: 5.5 mg/dL — ABNORMAL HIGH (ref 0.3–1.2)
Total Protein: 5.8 g/dL — ABNORMAL LOW (ref 6.5–8.1)

## 2017-07-10 LAB — CBC
HCT: 35.9 % — ABNORMAL LOW (ref 39.0–52.0)
Hemoglobin: 12.5 g/dL — ABNORMAL LOW (ref 13.0–17.0)
MCH: 28 pg (ref 26.0–34.0)
MCHC: 34.8 g/dL (ref 30.0–36.0)
MCV: 80.3 fL (ref 78.0–100.0)
PLATELETS: 69 10*3/uL — AB (ref 150–400)
RBC: 4.47 MIL/uL (ref 4.22–5.81)
RDW: 14.2 % (ref 11.5–15.5)
WBC: 2.7 10*3/uL — ABNORMAL LOW (ref 4.0–10.5)

## 2017-07-10 LAB — BILIRUBIN, FRACTIONATED(TOT/DIR/INDIR)
BILIRUBIN DIRECT: 3.4 mg/dL — AB (ref 0.1–0.5)
BILIRUBIN TOTAL: 5.2 mg/dL — AB (ref 0.3–1.2)
Indirect Bilirubin: 1.8 mg/dL — ABNORMAL HIGH (ref 0.3–0.9)

## 2017-07-10 LAB — GLUCOSE, CAPILLARY: GLUCOSE-CAPILLARY: 73 mg/dL (ref 65–99)

## 2017-07-10 MED ORDER — BUTALBITAL-APAP-CAFFEINE 50-325-40 MG PO TABS
1.0000 | ORAL_TABLET | Freq: Once | ORAL | Status: AC
  Administered 2017-07-10: 1 via ORAL
  Filled 2017-07-10: qty 1

## 2017-07-10 MED ORDER — OXYCODONE HCL 5 MG PO TABS
5.0000 mg | ORAL_TABLET | Freq: Four times a day (QID) | ORAL | Status: DC | PRN
  Administered 2017-07-10 – 2017-07-11 (×2): 5 mg via ORAL
  Filled 2017-07-10 (×2): qty 1

## 2017-07-10 MED ORDER — POTASSIUM CHLORIDE CRYS ER 20 MEQ PO TBCR
40.0000 meq | EXTENDED_RELEASE_TABLET | Freq: Once | ORAL | Status: AC
  Administered 2017-07-10: 40 meq via ORAL
  Filled 2017-07-10: qty 2

## 2017-07-10 NOTE — Progress Notes (Signed)
Date:  July 10, 2017 Chart reviewed for concurrent status and case management needs.  Will continue to follow patient progress.  Discharge Planning: following for needs  Expected discharge date: [id number]  Rhonda Davis, BSN, RN3, CCM   [phone]  

## 2017-07-10 NOTE — Progress Notes (Signed)
Pt. doesn't not want to wear CPAP at this time.

## 2017-07-10 NOTE — Progress Notes (Signed)
[facility] PROGRESS NOTE  Tony [name] IHW:[id number] DOB: 10-18-1975 DOA: 07/06/2017  PCP: Binnie Rail, MD  Brief History/Interval Summary: 41 year old Caucasian male with past medical history of hypertension, asthma, GERD, depression, with history of obstructive sleep apnea on CPAP, presented with fever, chills, body aches, headaches and abdominal pain. Symptoms have been ongoing for 2 days. Patient was found to have abnormal LFTs. He was hospitalized for further management.  Reason for Visit: transaminitis  Consultants: none yet  Procedures: none  Antibiotics: Initially started on vancomycin and Zosyn Vancomycin was discontinued on 10/17 Zosyn discontinued on 10/18   Subjective/Interval History: Patient with the numerous complaints today including body aches, dark urine, pale stool, ear pain, nasal congestion, headache. Tolerating his liquid diet.  ROS: as above  Objective:  Vital Signs  Vitals:   07/09/17 1437 07/09/17 2133 07/10/17 0513 07/10/17 1340  BP: 130/82 111/70 97/63 (!) 145/93  Pulse: 80 (!) 59 65 87  Resp: (!) '24 20 16 20  ' Temp: 99.1 F (37.3 C) 98.2 F (36.8 C) 98.3 F (36.8 C)   TempSrc: Oral Oral Oral   SpO2: 100% 99% 100% 98%  Weight:      Height:        Intake/Output Summary (Last 24 hours) at 07/10/17 1454 Last data filed at 07/10/17 1400  Gross per 24 hour  Intake          7104.17 ml  Output             1382 ml  Net          5722.17 ml   Filed Weights   07/06/17 1714 07/07/17 0236  Weight: 78.9 kg (174 lb) 78.9 kg (174 lb)    General appearance: awake alert. In no distress Both his ears were examined without any abnormal findings. No wax. Tympanic membrane normal. Resp: clear to auscultation bilaterally. Cardio: S1, S2 normal, regular. GI: abdomen is soft. Nontender, nondistended. Bowel sounds are present. No masses, organomegaly Extremities: no edema Neurologic: no focal deficits  Lab Results:  Data Reviewed: I  have personally reviewed following labs and imaging studies  CBC:  Recent Labs Lab 07/06/17 1729 07/08/17 0618 07/09/17 0706 07/10/17 0613  WBC 2.8* 1.3* 2.1* 2.7*  NEUTROABS 2.5  --  1.3*  --   HGB 16.1 13.3 13.3 12.5*  HCT 45.4 38.3* 38.0* 35.9*  MCV 80.9 81.3 80.2 80.3  PLT 143* 76* 68* 69*    Basic Metabolic Panel:  Recent Labs Lab 07/06/17 1729 07/07/17 0645 07/08/17 0618 07/09/17 0706 07/10/17 0613  NA 135  --  136 133* 134*  K 3.4*  --  3.3* 3.9 3.5  CL 101  --  103 101 101  CO2 24  --  26 21* 25  GLUCOSE 112*  --  94 91 98  BUN 11  --  '15 17 10  ' CREATININE 1.17 1.23 1.47* 1.32* 1.00  CALCIUM 8.7*  --  7.9* 8.3* 8.3*    GFR: Estimated Creatinine Clearance: 90 mL/min (by C-G formula based on SCr of 1 mg/dL).  Liver Function Tests:  Recent Labs Lab 07/06/17 1729 07/08/17 0618 07/09/17 0706 07/10/17 0613  AST 129* 115* 102* 115*  ALT 252* 181* 160* 153*  ALKPHOS 146* 92 109 129*  BILITOT 1.4* 3.5* 4.7* 5.2*  5.5*  PROT 7.3 5.6* 5.9* 5.8*  ALBUMIN 4.3 3.2* 3.4* 3.1*     Recent Labs Lab 07/06/17 2340  LIPASE 35   Coagulation Profile:  Recent Labs Lab  07/07/17 0645  INR 1.41    CBG:  Recent Labs Lab 07/08/17 0754 07/08/17 0839 07/10/17 0727  GLUCAP 62* 82 73     Radiology Studies: Mr 3d Recon At Scanner  Result Date: 07/08/2017 CLINICAL DATA:  Elevated liver function tests.  Fever. EXAM: MRI ABDOMEN WITHOUT AND WITH CONTRAST (INCLUDING MRCP) TECHNIQUE: Multiplanar multisequence MR imaging of the abdomen was performed both before and after the administration of intravenous contrast. Heavily T2-weighted images of the biliary and pancreatic ducts were obtained, and three-dimensional MRCP images were rendered by post processing. CONTRAST:  15 mL MultiHance COMPARISON:  CT on 07/07/2017 FINDINGS: Image degradation by motion artifact noted. Lower chest: No acute findings. Hepatobiliary: No hepatic masses identified. Gallbladder is  nearly completely collapsed. No evidence of biliary ductal dilatation, with common bile duct measuring 5 mm. No evidence of choledocholithiasis. Pancreas:  No mass or inflammatory changes. Spleen:  Within normal limits in size and appearance. Adrenals/Urinary Tract: No masses identified. No evidence of hydronephrosis. Stomach/Bowel: Visualized portions within the abdomen are unremarkable. Vascular/Lymphatic: No pathologically enlarged lymph nodes identified. No abdominal aortic aneurysm. Other: Stable small paraumbilical ventral hernia containing only fat. Musculoskeletal:  No suspicious bone lesions identified. IMPRESSION: Image degradation by motion artifact. No hepatobiliary abnormality or acute findings. Stable small paraumbilical hernia containing only fat. Electronically Signed   By: Earle Gell M.D.   On: 07/08/2017 15:18   Mr Abdomen Mrcp Moise Boring Contast  Result Date: 07/08/2017 CLINICAL DATA:  Elevated liver function tests.  Fever. EXAM: MRI ABDOMEN WITHOUT AND WITH CONTRAST (INCLUDING MRCP) TECHNIQUE: Multiplanar multisequence MR imaging of the abdomen was performed both before and after the administration of intravenous contrast. Heavily T2-weighted images of the biliary and pancreatic ducts were obtained, and three-dimensional MRCP images were rendered by post processing. CONTRAST:  15 mL MultiHance COMPARISON:  CT on 07/07/2017 FINDINGS: Image degradation by motion artifact noted. Lower chest: No acute findings. Hepatobiliary: No hepatic masses identified. Gallbladder is nearly completely collapsed. No evidence of biliary ductal dilatation, with common bile duct measuring 5 mm. No evidence of choledocholithiasis. Pancreas:  No mass or inflammatory changes. Spleen:  Within normal limits in size and appearance. Adrenals/Urinary Tract: No masses identified. No evidence of hydronephrosis. Stomach/Bowel: Visualized portions within the abdomen are unremarkable. Vascular/Lymphatic: No pathologically enlarged  lymph nodes identified. No abdominal aortic aneurysm. Other: Stable small paraumbilical ventral hernia containing only fat. Musculoskeletal:  No suspicious bone lesions identified. IMPRESSION: Image degradation by motion artifact. No hepatobiliary abnormality or acute findings. Stable small paraumbilical hernia containing only fat. Electronically Signed   By: Earle Gell M.D.   On: 07/08/2017 15:18     Medications:  Scheduled: . acyclovir ointment   Topical Q3H while awake  . amphetamine-dextroamphetamine  30 mg Oral Daily  . clonazePAM  0.5 mg Oral BID  . diclofenac sodium  2 g Topical BID  . fluticasone  1 spray Each Nare Daily  . oxymetazoline  2 spray Each Nare BID  . pantoprazole  40 mg Oral Daily  . sertraline  100 mg Oral Daily  . tiZANidine  4 mg Oral QHS   Continuous: . sodium chloride 100 mL/hr at 07/10/17 5993   TTS:[id number], albuterol, cyclobenzaprine, hydrALAZINE, morphine injection, ondansetron, SUMAtriptan, zolpidem  Assessment/Plan:  Principal Problem:   Sepsis ([place]) Active Problems:   Obstructive sleep apnea   Essential hypertension   ADD (attention deficit disorder)   Migraine   Asthma, mild intermittent   Major depressive disorder, recurrent episode, moderate ([place])  Esophageal reflux   Abnormal LFTs   Abdominal pain   Hypokalemia    Sepsis of unknown source/possible viral syndrome Patient met criteria for sepsis with leukopenia, fever, tachycardia and tachypnea. Lactic acid level was normal. Source of infection is not clear. This could be some kind of viral syndrome. Flu PCR negative. HIV negative. Hepatitis panel negative. Patient does not have any skin rashes. No joint swelling. Blood cultures are negative. Pro-calcitonin level was low. Patient was initially started on vancomycin and Zosyn, both of which have been stopped. Patient remains afebrile. Continues to have body aches and other nonspecific symptoms. Patient was reassured.  Transaminitis  with hyperbilirubinemia. Liver function tests are stable. Bilirubin noted to be slightly higher. Extensive workup has been done including CT scan of the abdomen, abdominal ultrasound, MRCP. Hepatitis panel negative. If his bilirubin remains stable he could be discharged so, in and then he will need to have LFTs tested as an outpatient in 1-2 weeks. He will also benefit from having repeat hepatitis panel, especially hepatitis B be checked in a few weeks' time. All of this was communicated to the patient. Tylenol level was less than 10. INR normal.  Leukopenia and thrombocytopenia. Likely due to same problem causing sepsis and transaminitis. WBC is improving. Platelet count is stable.  History of essential hypertension. Blood pressure is reasonably well controlled. Continue to monitor.  Hypokalemia. Repleted  History of depression and anxiety/ADD Continue home medications.  History of mild intermittent asthma. Stable.  History of obstructive sleep apnea. Has not used CPAP in several years since he lost significant amount of weight and was told by his PCP that he does not need it anymore.   DVT Prophylaxis: SCDs Code Status: full code  Family Communication: discussed with the patient  Disposition Plan: Mobilize in the hallways. Anticipate discharge once LFTs stabilize.    LOS: 3 days   [facility] Pager [phone] 07/10/2017, 2:54 PM  If 7PM-7AM, please contact night-coverage at [email or web address], password [facility]

## 2017-07-11 LAB — COMPREHENSIVE METABOLIC PANEL
ALBUMIN: 3 g/dL — AB (ref 3.5–5.0)
ALK PHOS: 193 U/L — AB (ref 38–126)
ALT: 167 U/L — ABNORMAL HIGH (ref 17–63)
ANION GAP: 7 (ref 5–15)
AST: 128 U/L — ABNORMAL HIGH (ref 15–41)
BUN: 8 mg/dL (ref 6–20)
CHLORIDE: 101 mmol/L (ref 101–111)
CO2: 27 mmol/L (ref 22–32)
Calcium: 8.4 mg/dL — ABNORMAL LOW (ref 8.9–10.3)
Creatinine, Ser: 0.91 mg/dL (ref 0.61–1.24)
GFR calc Af Amer: >60 mL/min (ref >60)
GFR calc non Af Amer: >60 mL/min (ref >60)
GLUCOSE: 118 mg/dL — AB (ref 65–99)
POTASSIUM: 3.5 mmol/L (ref 3.5–5.1)
SODIUM: 135 mmol/L (ref 135–145)
Total Bilirubin: 5 mg/dL — ABNORMAL HIGH (ref 0.3–1.2)
Total Protein: 5.8 g/dL — ABNORMAL LOW (ref 6.5–8.1)

## 2017-07-11 LAB — CBC
HCT: 33.9 % — ABNORMAL LOW (ref 39.0–52.0)
HEMOGLOBIN: 11.8 g/dL — AB (ref 13.0–17.0)
MCH: 28 pg (ref 26.0–34.0)
MCHC: 34.8 g/dL (ref 30.0–36.0)
MCV: 80.3 fL (ref 78.0–100.0)
Platelets: 84 10*3/uL — ABNORMAL LOW (ref 150–400)
RBC: 4.22 MIL/uL (ref 4.22–5.81)
RDW: 14.3 % (ref 11.5–15.5)
WBC: 4.1 10*3/uL (ref 4.0–10.5)

## 2017-07-11 LAB — GLUCOSE, CAPILLARY: Glucose-Capillary: 104 mg/dL — ABNORMAL HIGH (ref 65–99)

## 2017-07-11 MED ORDER — BUTALBITAL-APAP-CAFFEINE 50-325-40 MG PO TABS: 1.0000 | ORAL_TABLET | Freq: Four times a day (QID) | ORAL | 0 refills | Status: DC | PRN

## 2017-07-11 NOTE — Discharge Summary (Signed)
[facility]  Physician Discharge Summary   Patient ID: Tony [name] MRN: [id number] DOB/AGE: [birth year] 41 y.o.  Admit date: 07/06/2017 Discharge date: 07/11/2017  PCP: Binnie Rail, MD  DISCHARGE DIAGNOSES:  Principal Problem:   Sepsis [facility]) Active Problems:   Obstructive sleep apnea   Essential hypertension   ADD (attention deficit disorder)   Migraine   Asthma, mild intermittent   Major depressive disorder, recurrent episode, moderate (HCC)   Esophageal reflux   Abnormal LFTs   Abdominal pain   Hypokalemia   RECOMMENDATIONS FOR OUTPATIENT FOLLOW UP: 1. Patient will need to undergo blood work next week and follow-up to recheck LFTs and CBC 2. Also consider repeating hepatitis B antibodies in 4-6 weeks  DISCHARGE CONDITION: fair  Diet recommendation: As before  Filed Weights   07/06/17 1714 07/07/17 0236  Weight: 78.9 kg (174 lb) 78.9 kg (174 lb)    INITIAL HISTORY: 41 year old Caucasian male with past medical history of hypertension, asthma, GERD, depression, with history of obstructive sleep apnea on CPAP, presented with fever, chills, body aches, headaches and abdominal pain. Symptoms have been ongoing for 2 days. Patient was found to have abnormal LFTs. He was hospitalized for further management.   HOSPITAL COURSE:   Sepsis of unknown source/possible viral syndrome Patient met criteria for sepsis with leukopenia, fever, tachycardia and tachypnea. Lactic acid level was normal. Source of infection is not clear. This could be some kind of viral syndrome. Flu PCR negative. HIV negative. Hepatitis panel negative. Patient does not have any skin rashes. No joint swelling. Blood cultures are negative. Pro-calcitonin level was low. Patient was initially started on vancomycin and Zosyn, both of which have been stopped. Patient remains afebrile. Continues to have body aches and other nonspecific symptoms. Patient was reassured.  Transaminitis with  hyperbilirubinemia. Liver function tests are stable. Bilirubin noted to be slightly higher. Extensive workup has been done including CT scan of the abdomen, abdominal ultrasound, MRCP. Hepatitis panel negative.  His bilirubin is improving.  His AST ALT levels are stable.  This can be pursued further as an outpatient.  Hepatitis B antibody to be rechecked in a few weeks time.    Leukopenia and thrombocytopenia. Likely due to same problem causing sepsis and transaminitis. WBC is improving. Platelet count is improving as well.  History of essential hypertension. Blood pressure is reasonably well controlled.   History of depression and anxiety/ADD Continue home medications.  History of mild intermittent asthma. Stable.  History of obstructive sleep apnea. Has not used CPAP in several years since he lost significant amount of weight and was told by his PCP that he does not need it anymore.  History of migraine headaches He was continued on Imitrex with only partial relief.  He will be prescribed a few tablets of Fioricet.  Overall stable.  Patient reassured as he again mentioned multiple symptoms today.  Overall he has been stable afebrile.  Labs are stable.  Okay for discharge home today.   PERTINENT LABS:  The results of significant diagnostics from this hospitalization (including imaging, microbiology, ancillary and laboratory) are listed below for reference.    Microbiology: Recent Results (from the past 240 hour(s))  Culture, blood (routine x 2)     Status: None (Preliminary result)   Collection Time: 07/06/17 11:40 PM  Result Value Ref Range Status   Specimen Description BLOOD RIGHT ANTECUBITAL  Final   Special Requests   Final    BOTTLES DRAWN AEROBIC AND ANAEROBIC Blood Culture adequate volume  Culture   Final    NO GROWTH 4 DAYS Performed at [facility], [place] [address], [place], [place] [address]    Report Status PENDING  Incomplete  Culture, blood (routine  x 2)     Status: None (Preliminary result)   Collection Time: 07/06/17 11:50 PM  Result Value Ref Range Status   Specimen Description BLOOD LEFT ARM  Final   Special Requests   Final    BOTTLES DRAWN AEROBIC AND ANAEROBIC Blood Culture adequate volume   Culture   Final    NO GROWTH 4 DAYS Performed at [facility], [address] [place], [place] [address]    Report Status PENDING  Incomplete  Respiratory Panel by PCR     Status: None   Collection Time: 07/08/17  1:25 PM  Result Value Ref Range Status   Adenovirus NOT DETECTED NOT DETECTED Final   Coronavirus 229E NOT DETECTED NOT DETECTED Final   Coronavirus HKU1 NOT DETECTED NOT DETECTED Final   Coronavirus NL63 NOT DETECTED NOT DETECTED Final   Coronavirus OC43 NOT DETECTED NOT DETECTED Final   Metapneumovirus NOT DETECTED NOT DETECTED Final   Rhinovirus / Enterovirus NOT DETECTED NOT DETECTED Final   Influenza A NOT DETECTED NOT DETECTED Final   Influenza B NOT DETECTED NOT DETECTED Final   Parainfluenza Virus 1 NOT DETECTED NOT DETECTED Final   Parainfluenza Virus 2 NOT DETECTED NOT DETECTED Final   Parainfluenza Virus 3 NOT DETECTED NOT DETECTED Final   Parainfluenza Virus 4 NOT DETECTED NOT DETECTED Final   Respiratory Syncytial Virus NOT DETECTED NOT DETECTED Final   Bordetella pertussis NOT DETECTED NOT DETECTED Final   Chlamydophila pneumoniae NOT DETECTED NOT DETECTED Final   Mycoplasma pneumoniae NOT DETECTED NOT DETECTED Final    Comment: Performed at [facility], [place] [address] [place], [place] [address]     Labs: Basic Metabolic Panel:  Recent Labs Lab 07/06/17 1729 07/07/17 0645 07/08/17 0618 07/09/17 0706 07/10/17 0613 07/11/17 0649  NA 135  --  136 133* 134* 135  K 3.4*  --  3.3* 3.9 3.5 3.5  CL 101  --  103 101 101 101  CO2 24  --  26 21* 25 27  GLUCOSE 112*  --  94 91 98 118*  BUN 11  --  _0 CREATININE 1.17 1.23 1.47* 1.32* 1.00 0.91  CALCIUM 8.7*  --  7.9* 8.3* 8.3* 8.4*    Liver Function Tests:  Recent Labs Lab 07/06/17 1729 07/08/17 0618 07/09/17 0706 07/10/17 0613 07/11/17 0649  AST 129* 115* 102* 115* 128*  ALT 252* 181* 160* 153* 167*  ALKPHOS 146* 92 109 129* 193*  BILITOT 1.4* 3.5* 4.7* 5.2*  5.5* 5.0*  PROT 7.3 5.6* 5.9* 5.8* 5.8*  ALBUMIN 4.3 3.2* 3.4* 3.1* 3.0*    Recent Labs Lab 07/06/17 2340  LIPASE 35   CBC:  Recent Labs Lab 07/06/17 1729 07/08/17 0618 07/09/17 0706 07/10/17 0613 07/11/17 0649  WBC 2.8* 1.3* 2.1* 2.7* 4.1  NEUTROABS 2.5  --  1.3*  --   --   HGB 16.1 13.3 13.3 12.5* 11.8*  HCT 45.4 38.3* 38.0* 35.9* 33.9*  MCV 80.9 81.3 80.2 80.3 80.3  PLT 143* 76* 68* 69* 84*   CBG:  Recent Labs Lab 07/08/17 0754 07/08/17 0839 07/10/17 0727 07/11/17 0717  GLUCAP 62* 82 73 104*     IMAGING STUDIES Dg Chest 2 View  Result Date: 07/06/2017 CLINICAL DATA:  Fever for 2 days, nonproductive cough. EXAM: CHEST  2 VIEW COMPARISON:  09/29/2016. FINDINGS: The heart size and mediastinal contours are within normal limits. Both lungs are clear. The visualized skeletal structures are unremarkable. IMPRESSION: No active cardiopulmonary disease.  Stable chest. Electronically Signed   By: Staci Righter M.D.   On: 07/06/2017 18:11   US Abdomen Complete  Result Date: 07/07/2017 CLINICAL DATA:  Initial evaluation for elevated LFTs, fever. EXAM: ABDOMEN ULTRASOUND COMPLETE COMPARISON:  None available. FINDINGS: Gallbladder: Gallbladder contracted, somewhat limiting evaluation. No echogenic calculi identified. No internal sludge. Mild diffuse gallbladder wall thickening up to 3.5 mm favored to be related to contraction. No free pericholecystic fluid. No sonographic Murphy sign elicited on exam. Common bile duct: Diameter: 2.7 mm Liver: No focal lesion identified. Within normal limits in parenchymal echogenicity. Portal vein is patent on color Doppler imaging with normal direction of blood flow towards the liver. IVC: No abnormality  visualized. Pancreas: Visualized portion unremarkable. Spleen: Size and appearance within normal limits. Right Kidney: Length: 10.8 cm. Echogenicity within normal limits. No mass or hydronephrosis visualized. Left Kidney: Length: 10.3 cm. Echogenicity within normal limits. No mass or hydronephrosis visualized. Abdominal aorta: Not well visualized due to shadowing from overlying bowel gas. Other findings: None. IMPRESSION: 1. Negative abdominal ultrasound.  No acute abnormality identified. 2. Mild diffuse gallbladder wall thickening, felt to be related to contraction. No cholelithiasis or other sonographic features to suggest acute cholecystitis. No biliary dilatation. Electronically Signed   By: Jeannine Boga M.D.   On: 07/07/2017 01:49   Ct Abdomen Pelvis W Contrast  Result Date: 07/07/2017 CLINICAL DATA:  Fever, body aches, nausea, diarrhea. Negative flu test. EXAM: CT ABDOMEN AND PELVIS WITH CONTRAST TECHNIQUE: Multidetector CT imaging of the abdomen and pelvis was performed using the standard protocol following bolus administration of intravenous contrast. CONTRAST:  100 mL Isovue-300 COMPARISON:  Ultrasound abdomen 07/07/2017 FINDINGS: Lower chest: Mild dependent changes in the lung bases. Hepatobiliary: Gallbladder is contracted, likely due to nonfasting state. No bile duct dilatation. No focal liver lesions. Pancreas: Unremarkable. No pancreatic ductal dilatation or surrounding inflammatory changes. Spleen: Normal in size without focal abnormality. Adrenals/Urinary Tract: Adrenal glands are unremarkable. Kidneys are normal, without renal calculi, focal lesion, or hydronephrosis. Bladder is unremarkable. Stomach/Bowel: Stomach is within normal limits. Appendix appears normal. Colonic diverticula. No evidence of bowel wall thickening, distention, or inflammatory changes. Vascular/Lymphatic: No significant vascular findings are present. No enlarged abdominal or pelvic lymph nodes. Reproductive:  Prostate is unremarkable. Other: Small bilateral inguinal hernias containing fat. Small periumbilical hernia containing fat. Infiltration in the herniated fat at the periumbilical region may indicate fat necrosis. No bowel herniation. No free air or free fluid in the abdomen. Musculoskeletal: No acute or significant osseous findings. IMPRESSION: 1. Small periumbilical hernia containing fat with possible fat necrosis. 2. Small bilateral inguinal hernias containing fat. 3. No evidence of bowel obstruction or inflammation. Scattered colonic diverticula. Electronically Signed   By: Lucienne Capers M.D.   On: 07/07/2017 05:07   Mr 3d Recon At Scanner  Result Date: 07/08/2017 CLINICAL DATA:  Elevated liver function tests.  Fever. EXAM: MRI ABDOMEN WITHOUT AND WITH CONTRAST (INCLUDING MRCP) TECHNIQUE: Multiplanar multisequence MR imaging of the abdomen was performed both before and after the administration of intravenous contrast. Heavily T2-weighted images of the biliary and pancreatic ducts were obtained, and three-dimensional MRCP images were rendered by post processing. CONTRAST:  15 mL MultiHance COMPARISON:  CT on 07/07/2017 FINDINGS: Image degradation by motion artifact noted.  Lower chest: No acute findings. Hepatobiliary: No hepatic masses identified. Gallbladder is nearly completely collapsed. No evidence of biliary ductal dilatation, with common bile duct measuring 5 mm. No evidence of choledocholithiasis. Pancreas:  No mass or inflammatory changes. Spleen:  Within normal limits in size and appearance. Adrenals/Urinary Tract: No masses identified. No evidence of hydronephrosis. Stomach/Bowel: Visualized portions within the abdomen are unremarkable. Vascular/Lymphatic: No pathologically enlarged lymph nodes identified. No abdominal aortic aneurysm. Other: Stable small paraumbilical ventral hernia containing only fat. Musculoskeletal:  No suspicious bone lesions identified. IMPRESSION: Image degradation by  motion artifact. No hepatobiliary abnormality or acute findings. Stable small paraumbilical hernia containing only fat. Electronically Signed   By: Earle Gell M.D.   On: 07/08/2017 15:18   Mr Abdomen Mrcp Moise Boring Contast  Result Date: 07/08/2017 CLINICAL DATA:  Elevated liver function tests.  Fever. EXAM: MRI ABDOMEN WITHOUT AND WITH CONTRAST (INCLUDING MRCP) TECHNIQUE: Multiplanar multisequence MR imaging of the abdomen was performed both before and after the administration of intravenous contrast. Heavily T2-weighted images of the biliary and pancreatic ducts were obtained, and three-dimensional MRCP images were rendered by post processing. CONTRAST:  15 mL MultiHance COMPARISON:  CT on 07/07/2017 FINDINGS: Image degradation by motion artifact noted. Lower chest: No acute findings. Hepatobiliary: No hepatic masses identified. Gallbladder is nearly completely collapsed. No evidence of biliary ductal dilatation, with common bile duct measuring 5 mm. No evidence of choledocholithiasis. Pancreas:  No mass or inflammatory changes. Spleen:  Within normal limits in size and appearance. Adrenals/Urinary Tract: No masses identified. No evidence of hydronephrosis. Stomach/Bowel: Visualized portions within the abdomen are unremarkable. Vascular/Lymphatic: No pathologically enlarged lymph nodes identified. No abdominal aortic aneurysm. Other: Stable small paraumbilical ventral hernia containing only fat. Musculoskeletal:  No suspicious bone lesions identified. IMPRESSION: Image degradation by motion artifact. No hepatobiliary abnormality or acute findings. Stable small paraumbilical hernia containing only fat. Electronically Signed   By: Earle Gell M.D.   On: 07/08/2017 15:18    DISCHARGE EXAMINATION: Vitals:   07/10/17 1626 07/10/17 2030 07/11/17 0517 07/11/17 1321  BP:  123/83 114/75 121/81  Pulse:  77 68 78  Resp:  _0 Temp: 98.3 F (36.8 C) 98.2 F (36.8 C) 98.5 F (36.9 C) 99.2 F (37.3 C)    TempSrc: Oral Oral Oral Oral  SpO2:  100% 94% 95%  Weight:      Height:       General appearance: alert, cooperative, appears stated age and no distress Resp: clear to auscultation bilaterally Cardio: regular rate and rhythm, S1, S2 normal, no murmur, click, rub or gallop  DISPOSITION: Home  Discharge Instructions    Call MD for:  persistant nausea and vomiting    Complete by:  As directed    Call MD for:  severe uncontrolled pain    Complete by:  As directed    Call MD for:  temperature >100.4    Complete by:  As directed    Diet Carb Modified    Complete by:  As directed    Discharge instructions    Complete by:  As directed    Please be sure to visit with your primary care provider within 1 week.  You will need to have blood work done at that time to check your liver function test.  Your hepatitis B test should be repeated in a few weeks time.  If your liver function tests do not get back to normal then you may need referral to a liver  doctor.  You were cared for by a hospitalist during your hospital stay. If you have any questions about your discharge medications or the care you received while you were in the hospital after you are discharged, you can call the unit and asked to speak with the hospitalist on call if the hospitalist that took care of you is not available. Once you are discharged, your primary care physician will handle any further medical issues. Please note that NO REFILLS for any discharge medications will be authorized once you are discharged, as it is imperative that you return to your primary care physician (or establish a relationship with a primary care physician if you do not have one) for your aftercare needs so that they can reassess your need for medications and monitor your lab values. If you do not have a primary care physician, you can call [phone] for a physician referral.   Increase activity slowly    Complete by:  As directed       ALLERGIES:   Allergies  Allergen Reactions  . Erythromycin     Pt states med make him sick     Discharge Medication List as of 07/11/2017  1:46 PM    START taking these medications   Details  butalbital-acetaminophen-caffeine (FIORICET, ESGIC) 50-325-40 MG tablet Take 1-2 tablets by mouth every 6 (six) hours as needed for migraine., Starting Sat 07/11/2017, Print      CONTINUE these medications which have NOT CHANGED   Details  acyclovir ointment (ZOVIRAX) 5 % APPLY TOPICALLY EVERY 3 HOURS, Normal    albuterol (PROVENTIL) (2.5 MG/3ML) 0.083% nebulizer solution Take 3 mLs (2.5 mg total) by nebulization every 6 (six) hours as needed for wheezing or shortness of breath., Starting Mon 09/29/2016, Normal    amphetamine-dextroamphetamine (ADDERALL XR) 30 MG 24 hr capsule Take 1 capsule (30 mg total) by mouth every morning., Starting Mon 06/08/2017, Print    clonazePAM (KLONOPIN) 0.5 MG tablet Take 1 tablet (0.5 mg total) by mouth 2 (two) times daily., Starting Mon 06/08/2017, Print    cyclobenzaprine (FLEXERIL) 5 MG tablet Take 1 tablet (5 mg total) by mouth 3 (three) times daily as needed for muscle spasms., Starting Thu 05/07/2017, Normal    Diclofenac Sodium (PENNSAID) 2 % SOLN Place 2 application onto the skin 2 (two) times daily., Starting Fri 01/25/2016, Normal    fluticasone (FLONASE) 50 MCG/ACT nasal spray USE TWO SPRAY IN NOSE ONCE DAILY, Normal    hydrOXYzine (ATARAX/VISTARIL) 10 MG tablet Take 1 tablet (10 mg total) by mouth 3 (three) times daily as needed. OFFICE VISIT FOR FUTURE REFILLS, Starting Fri 04/17/2017, Normal    Ibuprofen-Famotidine 800-26.6 MG TABS Take 1 tablet 3 times a day., Normal    levalbuterol (XOPENEX HFA) 45 MCG/ACT inhaler Inhale 2 puffs into the lungs every 6 (six) hours as needed for wheezing., Starting Tue 09/30/2016, Normal    losartan-hydrochlorothiazide (HYZAAR) 50-12.5 MG tablet Take 1 tablet by mouth daily., Starting Mon 06/08/2017, Normal    pantoprazole  (PROTONIX) 40 MG tablet Take 1 tablet (40 mg total) by mouth daily., Starting Tue 06/17/2016, Normal    SAXENDA 18 MG/3ML SOPN INJECT 3MG INTO SKIN DAILY, Normal    sertraline (ZOLOFT) 100 MG tablet Take 1 tablet (100 mg total) by mouth daily., Starting Mon 06/08/2017, Normal    SUMAtriptan (IMITREX) 100 MG tablet Take 1 tablet (100 mg total) by mouth every 2 (two) hours as needed for migraine. May repeat in 2 hours if headache persists or recurs.,  Starting Tue 12/16/2016, Normal    tiZANidine (ZANAFLEX) 4 MG tablet TAKE 1 TABLET NIGHTLY, Normal    Insulin Pen Needle (BD ULTRA-FINE MICRO PEN NEEDLE) 32G X 6 MM MISC Use with Saxenda, Normal      STOP taking these medications     acetaminophen (TYLENOL) 500 MG tablet      ibuprofen (ADVIL,MOTRIN) 200 MG tablet      indomethacin (INDOCIN) 50 MG capsule          Follow-up Information    Binnie Rail, MD. Schedule an appointment as soon as possible for a visit in 5 day(s).   Specialty:  Internal Medicine Why:   Patient will need to have blood work including liver function tests.  Needs hepatitis B antibody repeated in a few weeks time. Contact information: [place] [address] [phone]           TOTAL DISCHARGE TIME: 35 minutes  [facility] Pager [phone]  07/11/2017, 2:43 PM

## 2017-07-11 NOTE — Discharge Instructions (Signed)
Liver Function Tests  Liver function tests are blood tests to see how well your liver is working. The proteins and enzymes measured in the test can alert your health care provider to inflammation, damage, or disease in your liver. It is common to have liver function tests:   During annual physical exams.   When you are taking certain medicines.   If you have liver disease.   If you drink a lot of alcohol.   When you are not feeling well.   When you have other conditions that may affect the liver.    Substances measured may include:   Alanine transaminase (ALT). This is an enzyme in the liver.   Aspartate transaminase (AST). This is an enzyme in the liver, heart, and muscles.   Alkaline phosphatase (ALP). This is a protein in the liver, bile ducts, and bone. It is also in other body tissues.   Total bilirubin. This is a yellow pigment in bile.   Albumin. This is a protein in the liver.   Prothrombin time and international normalized ratio (PT and INR). PT measures the time that it takes for your blood to clot. INR is a calculation of blood clotting time based upon your PT result. It is also calculated based on normal ranges defined by the laboratory that processed your lab test.   Total protein. This measures two proteins, albumin and globulin, found in the blood.    How do I prepare for this test?  How you prepare will depend on which tests are being done and the reason why these tests are being done. You may need to:   Avoid eating for 4-6 hours before the test or as directed by your health care provider.   Stop taking certain medicines prior to your blood test as directed by your health care provider.    What do the results mean?  It is your responsibility to obtain your test results. Ask the lab or department performing the test when and how you will get your results. Contact your health care provider to discuss any questions you have about your results.  RANGE OF NORMAL VALUES  Ranges for normal  values may vary among different labs and hospitals. You should always check with your health care provider after having lab work or other tests done to discuss the meaning of your test results and whether your values are considered within normal limits.  The following are normal ranges for substances measured in liver function tests:  ALT   Infant: may be twice as high as adult values.   Child or adult: 4-36 international units/L at 37C or 4-36 units/L (SI units).   Elderly: may be slightly higher than adult values.  AST   Newborn 0-5 days old: 35-140 units/L.   Child under 3 years old: 15-60 units/L.   3-6 years old: 15-50 units/L.   6-12 years old: 10-50 units/L.   12-18 years old: 10-40 units/L.   Adult: 0-35 units/L or 0-0.58 microkatal/L (SI units).   Elderly: slightly higher than adults.  ALP   Child under 2 years old: 85-235 units/L.   2-8 years old: 65-210 units/L.   9-15 years old: 60-300 units/L.   16-21 years old: 30-200 units/L.   Adult: 30-120 units/L or 0.5-2.0 microkatal/L (SI units).   Elderly: slightly higher than adult.  Total bilirubin   Newborn: 1.0-12.0 mg/dL or 17.1-205 micromoles/L (SI units).   Adult, elderly, or child: 0.3-1.0 mg/dL or 5.1-17 micromoles/L.  Albumin     Premature infant: 3.0-4.2 g/dL.   Newborn: 3.5-5.4 g/dL.   Infant: 4.4-5.4 g/dL.   Child: 4.0-5.9 g/dL.   Adult or elderly: 3.5-5.0 g/dL or 35-50 g/L (SI units).  PT   11.0-12.5 seconds; 85%-100%.  INR   0.8-1.1.  Total protein   Premature infant: 4.2-7.6 g/dL.   Newborn: 4.6-7.4 g/dL.   Infant: 6.0-6.7 g/dL.   Child: 6.2-8.0 g/dL.   Adult or elderly: 6.4-8.3 g/dL or 64-83 g/L (SI units).  MEANING OF RESULTS OUTSIDE NORMAL VALUE RANGES  Sometimes test results can be abnormal due to other factors, such as medicines, exercise, or pregnancy. Follow up with your health care provider if you have any questions about test results outside the normal value ranges.  ALT   Levels above the normal range,  along with other test results, may indicate liver disease.  AST   Levels above the normal range, along with other test results, may indicate liver disease. Sometimes levels also increase after burns, surgery, heart attack, muscle damage, or seizure.  ALP   Levels above the normal range, along with other test results, may indicate biliary obstruction, diseases of the liver, bone disease, thyroid disease, tumors, fractures, leukemia or lymphoma, or several other conditions. People with blood type O or B may show higher levels after a fatty meal.   Levels below the normal range, along with other test results, may indicate bone and teeth conditions, malnutrition, protein deficiency, or Wilson disease.  Total bilirubin   Levels above the normal range, along with other test results, may indicate problems with the liver, gallbladder, or bile ducts.  Albumin   Levels above the normal range, along with other test results, may indicate dehydration. They may also be caused by a diet that is high in protein. Sometimes, the band placed around the upper arm during the process of drawing blood can cause the level of this protein in your blood to rise and give you a result above the normal range.   Levels below the normal range, along with other tests results, may indicate kidney disease, liver disease, or malabsorption of nutrients.  PT and INR   Levels above the normal range mean your blood is clotting slower than normal. This may be due to blood disorders, liver disorders, or low levels of vitamin K.  Total protein   Levels above the normal range, along with other test results, may be due to infection or other diseases.   Levels below the normal range, along with other test results, may be due to an immune system disorder, bleeding, burns, kidney disorder, liver disease, trouble absorbing or getting enough nutrients, or other conditions that affect the intestines.  Talk with your health care provider to discuss your  results, treatment options, and if necessary, the need for more tests. Talk with your health care provider if you have any questions about your results.  This information is not intended to replace advice given to you by your health care provider. Make sure you discuss any questions you have with your health care provider.  Document Released: 10/11/2004 Document Revised: 05/14/2016 Document Reviewed: 01/12/2014  Elsevier Interactive Patient Education  2018 [employer] Inc[employer]

## 2017-07-11 NOTE — Progress Notes (Signed)
Discharge instructions and medications discussed with patient.  Prescription, letter, and AVS given to patient.  All questions answered.

## 2017-07-12 ENCOUNTER — Encounter: Payer: Self-pay | Admitting: Internal Medicine

## 2017-07-12 LAB — CULTURE, BLOOD (ROUTINE X 2)
CULTURE: NO GROWTH
Culture: NO GROWTH
Special Requests: ADEQUATE
Special Requests: ADEQUATE

## 2017-07-12 NOTE — Progress Notes (Signed)
Subjective:    Patient ID: Tony [name], male    DOB: 04/03/1976, 41 y.o.   MRN: [id number]  HPI   Medications and allergies reviewed with patient and updated if appropriate.  Patient Active Problem List   Diagnosis Date Noted  . Sepsis (HCC) 07/07/2017  . Abnormal LFTs 07/07/2017  . Abdominal pain 07/07/2017  . Hypokalemia 07/07/2017  . Fever 07/06/2017  . Body aches 07/06/2017  . Leg wound, right, subsequent encounter 01/21/2017  . Acute bronchitis 09/29/2016  . Mild intermittent asthma with acute exacerbation 09/29/2016  . Esophageal reflux 06/17/2016  . Nonallopathic lesion of cervical region 04/02/2016  . Nonallopathic lesion of thoracic region 04/02/2016  . Nonallopathic lesion-rib cage 04/02/2016  . Pain in right acromioclavicular joint 02/21/2016  . Chronic upper back pain 12/18/2015  . Major depressive disorder, recurrent episode, moderate (HCC) 05/31/2015  . Overweight   . Asthma, mild intermittent 03/31/2013  . Migraine   . Obstructive sleep apnea   . Generalized anxiety disorder   . Seasonal and perennial allergic rhinitis   . Essential hypertension   . ADD (attention deficit disorder)     Current Outpatient Prescriptions on File Prior to Visit  Medication Sig Dispense Refill  . acyclovir ointment (ZOVIRAX) 5 % APPLY TOPICALLY EVERY 3 HOURS 15 g 0  . albuterol (PROVENTIL) (2.5 MG/3ML) 0.083% nebulizer solution Take 3 mLs (2.5 mg total) by nebulization every 6 (six) hours as needed for wheezing or shortness of breath. 75 mL 12  . amphetamine-dextroamphetamine (ADDERALL XR) 30 MG 24 hr capsule Take 1 capsule (30 mg total) by mouth every morning. 30 capsule 0  . butalbital-acetaminophen-caffeine (FIORICET, ESGIC) 50-325-40 MG tablet Take 1-2 tablets by mouth every 6 (six) hours as needed for migraine. 20 tablet 0  . clonazePAM (KLONOPIN) 0.5 MG tablet Take 1 tablet (0.5 mg total) by mouth 2 (two) times daily. 180 tablet 0  . cyclobenzaprine (FLEXERIL) 5 MG  tablet Take 1 tablet (5 mg total) by mouth 3 (three) times daily as needed for muscle spasms. 90 tablet 1  . Diclofenac Sodium (PENNSAID) 2 % SOLN Place 2 application onto the skin 2 (two) times daily. 112 g 3  . fluticasone (FLONASE) 50 MCG/ACT nasal spray USE TWO SPRAY IN NOSE ONCE DAILY 16 g 5  . hydrOXYzine (ATARAX/VISTARIL) 10 MG tablet Take 1 tablet (10 mg total) by mouth 3 (three) times daily as needed. OFFICE VISIT FOR FUTURE REFILLS 90 tablet 0  . Ibuprofen-Famotidine 800-26.6 MG TABS Take 1 tablet 3 times a day. 270 tablet 1  . Insulin Pen Needle (BD ULTRA-FINE MICRO PEN NEEDLE) 32G X 6 MM MISC Use with Saxenda 100 each 1  . levalbuterol (XOPENEX HFA) 45 MCG/ACT inhaler Inhale 2 puffs into the lungs every 6 (six) hours as needed for wheezing. 3 Inhaler 0  . losartan-hydrochlorothiazide (HYZAAR) 50-12.5 MG tablet Take 1 tablet by mouth daily. 90 tablet 2  . pantoprazole (PROTONIX) 40 MG tablet Take 1 tablet (40 mg total) by mouth daily. 90 tablet 3  . SAXENDA 18 MG/3ML SOPN INJECT 3MG  INTO SKIN DAILY 45 mL 3  . sertraline (ZOLOFT) 100 MG tablet Take 1 tablet (100 mg total) by mouth daily. 90 tablet 2  . SUMAtriptan (IMITREX) 100 MG tablet Take 1 tablet (100 mg total) by mouth every 2 (two) hours as needed for migraine. May repeat in 2 hours if headache persists or recurs. 30 tablet 3  . tiZANidine (ZANAFLEX) 4 MG tablet TAKE 1 TABLET NIGHTLY  30 tablet 0   No current facility-administered medications on file prior to visit.     Past Medical History:  Diagnosis Date  . Allergic rhinitis   . Anxiety    overlap with depression and ADD  . Asthma   . Hepatitis A   . Hypertension   . Migraine   . Migraines   . OSA on CPAP    Sleep study 12/2011: severe, CPAP    Past Surgical History:  Procedure Laterality Date  . LASIK  2005  . WISDOM TOOTH EXTRACTION      Social History   Social History  . Marital status: Single    Spouse name: N/A  . Number of children: N/A  .  Years of education: N/A   Occupational History  . 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 Smoking status: Former Smoker    Packs/day: 0.24    Years: 3.00    Types: Cigarettes    Quit date: 09/22/2008  . Smokeless tobacco: Never Used  . Alcohol use 0.0 oz/week     Comment: OCCASSIONAL  . Drug use: No  . Sexual activity: Not on file   Other Topics Concern  . 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 Hypertension Mother   . Arthritis Father   . Hypertension Father   . Lung cancer Maternal Grandfather   . Diabetes Other   . Emphysema Paternal Grandfather   . Asthma Paternal Grandfather     Review of Systems     Objective:  There were no vitals filed for this visit. Wt Readings from Last 3 Encounters:  07/07/17 174 lb (78.9 kg)  05/07/17 174 lb (78.9 kg)  04/23/17 178 lb (80.7 kg)   There is no height or weight on file to calculate BMI.   Physical Exam       Assessment & Plan:    See Problem List for Assessment and Plan of chronic medical problems.   This encounter was created in error - please disregard.

## 2017-07-13 ENCOUNTER — Encounter: Payer: Self-pay | Admitting: Internal Medicine

## 2017-07-13 ENCOUNTER — Ambulatory Visit (INDEPENDENT_AMBULATORY_CARE_PROVIDER_SITE_OTHER): Payer: BC Managed Care – PPO | Admitting: Internal Medicine

## 2017-07-13 ENCOUNTER — Other Ambulatory Visit (INDEPENDENT_AMBULATORY_CARE_PROVIDER_SITE_OTHER): Payer: BC Managed Care – PPO

## 2017-07-13 VITALS — BP 142/86 | HR 75 | Temp 98.6°F | Resp 16 | Wt 176.0 lb

## 2017-07-13 DIAGNOSIS — R7989 Other specified abnormal findings of blood chemistry: Secondary | ICD-10-CM

## 2017-07-13 DIAGNOSIS — R1011 Right upper quadrant pain: Secondary | ICD-10-CM

## 2017-07-13 DIAGNOSIS — R509 Fever, unspecified: Secondary | ICD-10-CM

## 2017-07-13 DIAGNOSIS — R945 Abnormal results of liver function studies: Secondary | ICD-10-CM

## 2017-07-13 LAB — COMPREHENSIVE METABOLIC PANEL
ALBUMIN: 3.9 g/dL (ref 3.5–5.2)
ALK PHOS: 279 U/L — AB (ref 39–117)
ALT: 170 U/L — AB (ref 0–53)
AST: 103 U/L — AB (ref 0–37)
BUN: 11 mg/dL (ref 6–23)
CO2: 30 mEq/L (ref 19–32)
CREATININE: 0.96 mg/dL (ref 0.40–1.50)
Calcium: 9.2 mg/dL (ref 8.4–10.5)
Chloride: 98 mEq/L (ref 96–112)
GFR: 91.25 mL/min (ref >60.00)
Glucose, Bld: 118 mg/dL — ABNORMAL HIGH (ref 70–99)
Potassium: 3.4 mEq/L — ABNORMAL LOW (ref 3.5–5.1)
SODIUM: 137 meq/L (ref 135–145)
TOTAL PROTEIN: 6.8 g/dL (ref 6.0–8.3)
Total Bilirubin: 3.4 mg/dL — ABNORMAL HIGH (ref 0.2–1.2)

## 2017-07-13 MED ORDER — GADOBENATE DIMEGLUMINE 529 MG/ML IV SOLN
20.0000 mL | Freq: Once | INTRAVENOUS | Status: AC | PRN
  Administered 2017-07-13: 16 mL via INTRAVENOUS

## 2017-07-13 MED ORDER — HYDROXYZINE HCL 25 MG PO TABS: 25.0000 mg | ORAL_TABLET | Freq: Three times a day (TID) | ORAL | 0 refills | Status: DC | PRN

## 2017-07-13 MED ORDER — SUMATRIPTAN SUCCINATE 6 MG/0.5ML ~~LOC~~ SOLN
6.0000 mg | SUBCUTANEOUS | 5 refills | Status: DC | PRN
Start: 2017-07-13 — End: 2020-02-01

## 2017-07-13 NOTE — Patient Instructions (Signed)
  Test(s) ordered today. Your results will be released to MyChart (or called to you) after review, usually within 72hours after test completion. If any changes need to be made, you will be notified at that same time.   Medications reviewed and updated.  Changes include atarax for itching.  We will also try imitrex injections for your migraines.   Your prescription(s) have been submitted to your pharmacy. Please take as directed and contact our office if you believe you are having problem(s) with the medication(s).  A referral was ordered for GI

## 2017-07-13 NOTE — Assessment & Plan Note (Signed)
Liver enzymes continue to be elevated-they did not decrease while in the hospital Recheck CMP today along with autoimmune evaluation, CMV and hepatitis B antibody May need additional lab work Referred to GI Will remain out of work for now

## 2017-07-13 NOTE — Assessment & Plan Note (Signed)
He has some abdominal pain is in his right upper quadrant, right mid quadrant and suprapubic regions-no rebound or guarding Imaging while in the hospital all negative for cause Blood work as stated above

## 2017-07-13 NOTE — Progress Notes (Signed)
Subjective:    Patient ID: Tony [name], male    DOB: [birth year], 41 y.o.   MRN: [id number]  HPI The patient is here for follow up from the hospital.  Admitted 07/06/17 - 07/11/17 for sepsis.  RECOMMENDATIONS FOR OUTPATIENT FOLLOW UP: 1. Patient will need to undergo blood work next week and follow-up to recheck LFTs and CBC 2. Also consider repeating hepatitis B antibodies in 4-6 weeks   He was sent to the ED from our office due to fever, lethargy, body aches, headaches and abdominal pain.  He had the symptoms for 2 days.  He was found to have elevated lfts, leukopenia, fever, tachycardia and tachypnea.  Lactic acid was normal.  He did meet criteria for sepsis.  He was initially started on zosyn and vanco, but those were stopped.  No source of infection was found.   Sepsis:  Viral panel negative, rapid flu negative.  hiv negative.  Hepatitis C, Hepatitis A and Hepatitis B Ag/Core Ab IgM negative.  Blood cultures are negative.   Transaminitis, elevated bilirubin:  lfts remained elevated and stable.  Ct of the abdomen, abdominal US, mrcp were all negative.  It was recommended to recheck Hepatitis B Ab in a few weeks.   Leukopenia, thrombocytopenia:  Related to sepsis. WBC and platelet count improved.  Hypertension, depression, anxiety, ADD, mild asthma, OSA, migraines:  All stable   He had a fever last night of 100.7.  He still does not feel well. He has significantly decreased appetite and fatigue.His voice changes. Sounds weak at times. He does have a dry cough, occasional palpitations, some abdominal pain, body aches which have improved, headaches, lightheadedness and dizziness. He states he still feels he is jaundice and the hospitalist did not believe that he was. He is very concerned because it is not known what is causing the symptoms.  Medications and allergies reviewed with patient and updated if appropriate.  Patient Active Problem List   Diagnosis Date Noted  . Sepsis  (HCC) 07/07/2017  . Abnormal LFTs 07/07/2017  . Abdominal pain 07/07/2017  . Hypokalemia 07/07/2017  . Fever 07/06/2017  . Body aches 07/06/2017  . Leg wound, right, subsequent encounter 01/21/2017  . Acute bronchitis 09/29/2016  . Mild intermittent asthma with acute exacerbation 09/29/2016  . Esophageal reflux 06/17/2016  . Nonallopathic lesion of cervical region 04/02/2016  . Nonallopathic lesion of thoracic region 04/02/2016  . Nonallopathic lesion-rib cage 04/02/2016  . Pain in right acromioclavicular joint 02/21/2016  . Chronic upper back pain 12/18/2015  . Major depressive disorder, recurrent episode, moderate (HCC) 05/31/2015  . Overweight   . Asthma, mild intermittent 03/31/2013  . Migraine   . Obstructive sleep apnea   . Generalized anxiety disorder   . Seasonal and perennial allergic rhinitis   . Essential hypertension   . ADD (attention deficit disorder)     Current Outpatient Prescriptions on File Prior to Visit  Medication Sig Dispense Refill  . acyclovir ointment (ZOVIRAX) 5 % APPLY TOPICALLY EVERY 3 HOURS 15 g 0  . albuterol (PROVENTIL) (2.5 MG/3ML) 0.083% nebulizer solution Take 3 mLs (2.5 mg total) by nebulization every 6 (six) hours as needed for wheezing or shortness of breath. 75 mL 12  . amphetamine-dextroamphetamine (ADDERALL XR) 30 MG 24 hr capsule Take 1 capsule (30 mg total) by mouth every morning. 30 capsule 0  . butalbital-acetaminophen-caffeine (FIORICET, ESGIC) 50-325-40 MG tablet Take 1-2 tablets by mouth every 6 (six) hours as needed for migraine. 20 tablet 0  .  clonazePAM (KLONOPIN) 0.5 MG tablet Take 1 tablet (0.5 mg total) by mouth 2 (two) times daily. 180 tablet 0  . cyclobenzaprine (FLEXERIL) 5 MG tablet Take 1 tablet (5 mg total) by mouth 3 (three) times daily as needed for muscle spasms. 90 tablet 1  . Diclofenac Sodium (PENNSAID) 2 % SOLN Place 2 application onto the skin 2 (two) times daily. 112 g 3  . fluticasone (FLONASE) 50 MCG/ACT nasal  spray USE TWO SPRAY IN NOSE ONCE DAILY 16 g 5  . hydrOXYzine (ATARAX/VISTARIL) 10 MG tablet Take 1 tablet (10 mg total) by mouth 3 (three) times daily as needed. OFFICE VISIT FOR FUTURE REFILLS 90 tablet 0  . Ibuprofen-Famotidine 800-26.6 MG TABS Take 1 tablet 3 times a day. 270 tablet 1  . Insulin Pen Needle (BD ULTRA-FINE MICRO PEN NEEDLE) 32G X 6 MM MISC Use with Saxenda 100 each 1  . levalbuterol (XOPENEX HFA) 45 MCG/ACT inhaler Inhale 2 puffs into the lungs every 6 (six) hours as needed for wheezing. 3 Inhaler 0  . losartan-hydrochlorothiazide (HYZAAR) 50-12.5 MG tablet Take 1 tablet by mouth daily. 90 tablet 2  . pantoprazole (PROTONIX) 40 MG tablet Take 1 tablet (40 mg total) by mouth daily. 90 tablet 3  . SAXENDA 18 MG/3ML SOPN INJECT 3MG  INTO SKIN DAILY 45 mL 3  . sertraline (ZOLOFT) 100 MG tablet Take 1 tablet (100 mg total) by mouth daily. 90 tablet 2  . SUMAtriptan (IMITREX) 100 MG tablet Take 1 tablet (100 mg total) by mouth every 2 (two) hours as needed for migraine. May repeat in 2 hours if headache persists or recurs. 30 tablet 3  . tiZANidine (ZANAFLEX) 4 MG tablet TAKE 1 TABLET NIGHTLY 30 tablet 0   No current facility-administered medications on file prior to visit.     Past Medical History:  Diagnosis Date  . Allergic rhinitis   . Anxiety    overlap with depression and ADD  . Asthma   . Hepatitis A   . Hypertension   . Migraine   . Migraines   . OSA on CPAP    Sleep study 12/2011: severe, CPAP 10mmHg    Past Surgical History:  Procedure Laterality Date  . LASIK  2005  . WISDOM TOOTH EXTRACTION      Social History   Social History  . Marital status: Single    Spouse name: N/A  . Number of children: N/A  . Years of education: N/A   Occupational History  . 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 Smoking status: Former Smoker    Packs/day: 0.24    Years: 3.00    Types: Cigarettes    Quit date: 09/22/2008  . Smokeless  tobacco: Never Used  . Alcohol use 0.0 oz/week     Comment: OCCASSIONAL  . Drug use: No  . Sexual activity: Not Asked   Other Topics Concern  . 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 Hypertension Mother   . Arthritis Father   . Hypertension Father   . Lung cancer Maternal Grandfather   . Diabetes Other   . Emphysema Paternal Grandfather   . Asthma Paternal Grandfather     Review of Systems  Constitutional: Positive for appetite change (decreased - making himself eat), fatigue and fever.  HENT: Positive for voice  change.   Respiratory: Positive for cough. Negative for shortness of breath and wheezing.   Cardiovascular: Positive for palpitations. Negative for chest pain and leg swelling.  Gastrointestinal: Positive for abdominal pain (better - epigastric region). Negative for constipation, diarrhea and nausea.  Genitourinary: Negative for dysuria and hematuria.       Urine dark  Musculoskeletal: Positive for myalgias (improved, weak all over).  Neurological: Positive for dizziness, light-headedness and headaches.       Objective:   Vitals:   07/13/17 1309  BP: (!) 142/86  Pulse: 75  Resp: 16  Temp: 98.6 F (37 C)  SpO2: 98%   Wt Readings from Last 3 Encounters:  07/13/17 176 lb (79.8 kg)  07/07/17 174 lb (78.9 kg)  05/07/17 174 lb (78.9 kg)   Body mass index is 27.57 kg/m.   Physical Exam    Constitutional: Appears well-developed and well-nourished. No distress.  HENT:  Head: Normocephalic and atraumatic.  Neck: Neck supple. No tracheal deviation present. No thyromegaly present.  No cervical lymphadenopathy Cardiovascular: Normal rate, regular rhythm and normal heart sounds.   No murmur heard. No carotid bruit .  No edema Pulmonary/Chest: Effort normal and breath sounds normal. No respiratory distress. No has no wheezes. No rales.    Abdomen:  Soft, tender in RUQ, RMQ and suprapubic region w/o rebound and guarding, non distended Skin: Skin is warm and dry. Not diaphoretic. mild jaundice Psychiatric: Normal mood and affect. Behavior is normal.   MR 3D Recon At Scanner CLINICAL DATA:  Elevated liver function tests.  Fever.  EXAM: MRI ABDOMEN WITHOUT AND WITH CONTRAST (INCLUDING MRCP)  TECHNIQUE: Multiplanar multisequence MR imaging of the abdomen was performed both before and after the administration of intravenous contrast. Heavily T2-weighted images of the biliary and pancreatic ducts were obtained, and three-dimensional MRCP images were rendered by post processing.  CONTRAST:  15 mL MultiHance  COMPARISON:  CT on 07/07/2017  FINDINGS: Image degradation by motion artifact noted.  Lower chest: No acute findings.  Hepatobiliary: No hepatic masses identified. Gallbladder is nearly completely collapsed. No evidence of biliary ductal dilatation, with common bile duct measuring 5 mm. No evidence of choledocholithiasis.  Pancreas:  No mass or inflammatory changes.  Spleen:  Within normal limits in size and appearance.  Adrenals/Urinary Tract: No masses identified. No evidence of hydronephrosis.  Stomach/Bowel: Visualized portions within the abdomen are unremarkable.  Vascular/Lymphatic: No pathologically enlarged lymph nodes identified. No abdominal aortic aneurysm.  Other: Stable small paraumbilical ventral hernia containing only fat.  Musculoskeletal:  No suspicious bone lesions identified.  IMPRESSION: Image degradation by motion artifact. No hepatobiliary abnormality or acute findings.  Stable small paraumbilical hernia containing only fat.  Electronically Signed   By: Myles Rosenthal M.D.   On: 07/08/2017 15:18 MR ABDOMEN MRCP W WO CONTAST CLINICAL DATA:  Elevated liver function tests.  Fever.  EXAM: MRI ABDOMEN WITHOUT AND WITH CONTRAST (INCLUDING MRCP)  TECHNIQUE: Multiplanar  multisequence MR imaging of the abdomen was performed both before and after the administration of intravenous contrast. Heavily T2-weighted images of the biliary and pancreatic ducts were obtained, and three-dimensional MRCP images were rendered by post processing.  CONTRAST:  15 mL MultiHance  COMPARISON:  CT on 07/07/2017  FINDINGS: Image degradation by motion artifact noted.  Lower chest: No acute findings.  Hepatobiliary: No hepatic masses identified. Gallbladder is nearly completely collapsed. No evidence of biliary ductal dilatation, with common bile duct measuring 5 mm. No evidence of choledocholithiasis.  Pancreas:  No mass  or inflammatory changes.  Spleen:  Within normal limits in size and appearance.  Adrenals/Urinary Tract: No masses identified. No evidence of hydronephrosis.  Stomach/Bowel: Visualized portions within the abdomen are unremarkable.  Vascular/Lymphatic: No pathologically enlarged lymph nodes identified. No abdominal aortic aneurysm.  Other: Stable small paraumbilical ventral hernia containing only fat.  Musculoskeletal:  No suspicious bone lesions identified.  IMPRESSION: Image degradation by motion artifact. No hepatobiliary abnormality or acute findings.  Stable small paraumbilical hernia containing only fat.  Electronically Signed   By: Myles Rosenthal M.D.   On: 07/08/2017 15:18    Assessment & Plan:    See Problem List for Assessment and Plan of chronic medical problems.

## 2017-07-13 NOTE — Assessment & Plan Note (Signed)
He continues to have a fever, which is related to whatever is causing his other symptoms Recheck CMP, CMV which does not look like it had been checked previously and blood work for autoimmune hepatitis Will check hepatitis B antibody We'll refer to GI for elevated liver enzymes Continue symptomatic treatment

## 2017-07-14 ENCOUNTER — Encounter: Payer: Self-pay | Admitting: Internal Medicine

## 2017-07-14 ENCOUNTER — Encounter: Payer: Self-pay | Admitting: Nurse Practitioner

## 2017-07-15 ENCOUNTER — Encounter: Payer: Self-pay | Admitting: Internal Medicine

## 2017-07-16 ENCOUNTER — Encounter: Payer: Self-pay | Admitting: Internal Medicine

## 2017-07-16 LAB — CMV ABS, IGG+IGM (CYTOMEGALOVIRUS)
CMV IgM: <30 AU/mL
Cytomegalovirus Ab-IgG: <0.6 U/mL

## 2017-07-16 LAB — ANA: Anti Nuclear Antibody(ANA): NEGATIVE

## 2017-07-16 LAB — ANTI-SMOOTH MUSCLE ANTIBODY, IGG: Actin (Smooth Muscle) Antibody (IGG): <20 U (ref <20)

## 2017-07-16 LAB — ANTI-MICROSOMAL ANTIBODY LIVER / KIDNEY: LKM1 Ab: <=20 U (ref <=20.0)

## 2017-07-16 LAB — HEPATITIS B SURFACE ANTIBODY,QUALITATIVE: HEP B S AB: NONREACTIVE

## 2017-07-16 MED ORDER — ZOLPIDEM TARTRATE 5 MG PO TABS: 5.0000 mg | ORAL_TABLET | Freq: Every evening | ORAL | 0 refills | Status: DC | PRN

## 2017-07-16 NOTE — Telephone Encounter (Signed)
printed

## 2017-07-17 ENCOUNTER — Encounter: Payer: Self-pay | Admitting: Internal Medicine

## 2017-07-19 NOTE — Telephone Encounter (Signed)
Tony [name] - can you see if you can call if a script or find out what pharmacy that is.

## 2017-07-20 ENCOUNTER — Encounter: Payer: Self-pay | Admitting: Internal Medicine

## 2017-07-21 ENCOUNTER — Encounter: Payer: Self-pay | Admitting: Internal Medicine

## 2017-07-21 ENCOUNTER — Other Ambulatory Visit: Payer: Self-pay | Admitting: Family Medicine

## 2017-07-21 NOTE — Telephone Encounter (Signed)
Refill done.  

## 2017-07-22 ENCOUNTER — Ambulatory Visit: Payer: BC Managed Care – PPO | Admitting: Nurse Practitioner

## 2017-07-24 ENCOUNTER — Encounter: Payer: Self-pay | Admitting: Internal Medicine

## 2017-07-24 ENCOUNTER — Other Ambulatory Visit (INDEPENDENT_AMBULATORY_CARE_PROVIDER_SITE_OTHER): Payer: BC Managed Care – PPO

## 2017-07-24 ENCOUNTER — Ambulatory Visit (INDEPENDENT_AMBULATORY_CARE_PROVIDER_SITE_OTHER): Payer: BC Managed Care – PPO | Admitting: Internal Medicine

## 2017-07-24 DIAGNOSIS — B179 Acute viral hepatitis, unspecified: Secondary | ICD-10-CM

## 2017-07-24 LAB — COMPREHENSIVE METABOLIC PANEL
ALT: 54 U/L — ABNORMAL HIGH (ref 0–53)
AST: 26 U/L (ref 0–37)
Albumin: 4.5 g/dL (ref 3.5–5.2)
Alkaline Phosphatase: 152 U/L — ABNORMAL HIGH (ref 39–117)
BUN: 15 mg/dL (ref 6–23)
CHLORIDE: 98 meq/L (ref 96–112)
CO2: 30 meq/L (ref 19–32)
CREATININE: 1 mg/dL (ref 0.40–1.50)
Calcium: 9.7 mg/dL (ref 8.4–10.5)
GFR: 87.04 mL/min (ref >60.00)
GLUCOSE: 118 mg/dL — AB (ref 70–99)
Potassium: 3.9 mEq/L (ref 3.5–5.1)
SODIUM: 135 meq/L (ref 135–145)
Total Bilirubin: 0.9 mg/dL (ref 0.2–1.2)
Total Protein: 7.5 g/dL (ref 6.0–8.3)

## 2017-07-24 NOTE — Progress Notes (Signed)
Subjective:    Patient ID: Tony [name], male    DOB: 1976-08-05, 41 y.o.   MRN: [id number]  HPI The patient is here for follow up.  Viral hepatitis:  He did go to [facility] and they reviewed all of his testing from here, did an abdominal US and repeat blood work.  They feel he likely has viral hepatitis and advised him to rest - no treatment needed.  He is feeling much better.  He still feels fatigued.  He has mild RUQ discomfort.  His appetite is normal.  He feels a little warm at times, but he denies fever.  He has no other concerning symptoms.  He would like to return to work next week.   Medications and allergies reviewed with patient and updated if appropriate.  Patient Active Problem List   Diagnosis Date Noted  . Sepsis (HCC) 07/07/2017  . Abnormal LFTs 07/07/2017  . Abdominal pain 07/07/2017  . Hypokalemia 07/07/2017  . Fever 07/06/2017  . Body aches 07/06/2017  . Leg wound, right, subsequent encounter 01/21/2017  . Acute bronchitis 09/29/2016  . Mild intermittent asthma with acute exacerbation 09/29/2016  . Esophageal reflux 06/17/2016  . Nonallopathic lesion of cervical region 04/02/2016  . Nonallopathic lesion of thoracic region 04/02/2016  . Nonallopathic lesion-rib cage 04/02/2016  . Pain in right acromioclavicular joint 02/21/2016  . Chronic upper back pain 12/18/2015  . Major depressive disorder, recurrent episode, moderate (HCC) 05/31/2015  . Overweight   . Asthma, mild intermittent 03/31/2013  . Migraine   . Obstructive sleep apnea   . Generalized anxiety disorder   . Seasonal and perennial allergic rhinitis   . Essential hypertension   . ADD (attention deficit disorder)     Current Outpatient Prescriptions on File Prior to Visit  Medication Sig Dispense Refill  . acyclovir ointment (ZOVIRAX) 5 % APPLY TOPICALLY EVERY 3 HOURS 15 g 0  . albuterol (PROVENTIL) (2.5 MG/3ML) 0.083% nebulizer solution Take 3 mLs (2.5 mg total) by nebulization every 6  (six) hours as needed for wheezing or shortness of breath. 75 mL 12  . amphetamine-dextroamphetamine (ADDERALL XR) 30 MG 24 hr capsule Take 1 capsule (30 mg total) by mouth every morning. 30 capsule 0  . butalbital-acetaminophen-caffeine (FIORICET, ESGIC) 50-325-40 MG tablet Take 1-2 tablets by mouth every 6 (six) hours as needed for migraine. 20 tablet 0  . clonazePAM (KLONOPIN) 0.5 MG tablet Take 1 tablet (0.5 mg total) by mouth 2 (two) times daily. 180 tablet 0  . cyclobenzaprine (FLEXERIL) 5 MG tablet Take 1 tablet (5 mg total) by mouth 3 (three) times daily as needed for muscle spasms. 90 tablet 1  . Diclofenac Sodium (PENNSAID) 2 % SOLN Place 2 application onto the skin 2 (two) times daily. 112 g 3  . fluticasone (FLONASE) 50 MCG/ACT nasal spray USE TWO SPRAY IN NOSE ONCE DAILY 16 g 5  . hydrOXYzine (ATARAX/VISTARIL) 10 MG tablet TAKE 1 TABLET 3 TIMES A DAYAS NEEDED (OFFICE VISIT    FOR FUTURE REFILLS) 90 tablet 0  . hydrOXYzine (ATARAX/VISTARIL) 25 MG tablet Take 1 tablet (25 mg total) by mouth 3 (three) times daily as needed. 30 tablet 0  . Ibuprofen-Famotidine 800-26.6 MG TABS Take 1 tablet 3 times a day. 270 tablet 1  . Insulin Pen Needle (BD ULTRA-FINE MICRO PEN NEEDLE) 32G X 6 MM MISC Use with Saxenda 100 each 1  . levalbuterol (XOPENEX HFA) 45 MCG/ACT inhaler Inhale 2 puffs into the lungs every 6 (six)  hours as needed for wheezing. 3 Inhaler 0  . losartan-hydrochlorothiazide (HYZAAR) 50-12.5 MG tablet Take 1 tablet by mouth daily. 90 tablet 2  . pantoprazole (PROTONIX) 40 MG tablet Take 1 tablet (40 mg total) by mouth daily. 90 tablet 3  . SAXENDA 18 MG/3ML SOPN INJECT 3MG  INTO SKIN DAILY 45 mL 3  . sertraline (ZOLOFT) 100 MG tablet Take 1 tablet (100 mg total) by mouth daily. 90 tablet 2  . SUMAtriptan (IMITREX) 6 MG/0.5ML SOLN injection Inject 0.5 mLs (6 mg total) into the skin every 2 (two) hours as needed for migraine or headache. May repeat in 2 hrs if headache persists 0.5 mL 5  .  tiZANidine (ZANAFLEX) 4 MG tablet TAKE 1 TABLET NIGHTLY 30 tablet 0  . zolpidem (AMBIEN) 5 MG tablet Take 1-2 tablets (5-10 mg total) by mouth at bedtime as needed for sleep. 30 tablet 0   No current facility-administered medications on file prior to visit.     Past Medical History:  Diagnosis Date  . Allergic rhinitis   . Anxiety    overlap with depression and ADD  . Asthma   . Hepatitis A   . Hypertension   . Migraine   . Migraines   . OSA on CPAP    Sleep study 12/2011: severe, CPAP    Past Surgical History:  Procedure Laterality Date  . LASIK  2005  . WISDOM TOOTH EXTRACTION      Social History   Social History  . Marital status: Single    Spouse name: N/A  . Number of children: N/A  . Years of education: N/A   Occupational History  . 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 Smoking status: Former Smoker    Packs/day: 0.24    Years: 3.00    Types: Cigarettes    Quit date: 09/22/2008  . Smokeless tobacco: Never Used  . Alcohol use 0.0 oz/week     Comment: OCCASSIONAL  . Drug use: No  . Sexual activity: Not on file   Other Topics Concern  . 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 Hypertension Mother   . Arthritis Father   . Hypertension Father   . Lung cancer Maternal Grandfather   . Diabetes Other   . Emphysema Paternal Grandfather   . Asthma Paternal Grandfather     Review of Systems  Constitutional: Positive for fatigue (improved). Negative for appetite change (normal), chills and fever (subjective fever - low grade).  Respiratory: Negative for cough, shortness of breath and wheezing.   Cardiovascular: Negative for chest pain, palpitations and leg swelling.  Gastrointestinal: Negative for abdominal pain, blood in stool, constipation, diarrhea and nausea.       No gerd    Genitourinary:       Urine is normal color  Neurological: Negative for light-headedness and headaches.       Objective:   Vitals:   07/24/17 1133  BP: 138/88  Pulse: 93  Resp: 16  Temp: 98.7 F (37.1 C)  SpO2: 98%   Wt Readings from Last 3 Encounters:  07/24/17 179 lb (81.2 kg)  07/13/17 176 lb (79.8 kg)  07/07/17 174 lb (78.9 kg)   Body mass index is 28.04 kg/m.   Physical Exam    Constitutional: Appears well-developed and well-nourished.  No distress.  HENT:  Head: Normocephalic and atraumatic.  Cardiovascular: Normal rate, regular rhythm and normal heart sounds.   No murmur heard. No carotid bruit .  No edema Pulmonary/Chest: Effort normal and breath sounds normal. No respiratory distress. No has no wheezes. No rales.  Abdomen:  Soft, mild tenderness in RUQ w/o rebound or guarding, non distended Skin: Skin is warm and dry. Not diaphoretic.  Psychiatric: Normal mood and affect. Behavior is normal.     Assessment & Plan:    See Problem List for Assessment and Plan of chronic medical problems.

## 2017-07-24 NOTE — Patient Instructions (Addendum)
  Test(s) ordered today. Your results will be released to MyChart (or called to you) after review, usually within 72hours after test completion. If any changes need to be made, you will be notified at that same time.   Medications reviewed and updated.  No changes recommended at this time.    Please followup in 3 months   

## 2017-07-24 NOTE — Assessment & Plan Note (Signed)
Likely viral  Clinically much improved Will recheck cmp today Continue increased rest, fluids - expect fatigue, RUQ tenderness to resolve completely Will recheck hepatitis Ab at next visit

## 2017-07-28 ENCOUNTER — Other Ambulatory Visit: Payer: Self-pay | Admitting: Internal Medicine

## 2017-07-28 ENCOUNTER — Encounter: Payer: Self-pay | Admitting: Internal Medicine

## 2017-07-28 ENCOUNTER — Other Ambulatory Visit: Payer: Self-pay | Admitting: Family Medicine

## 2017-07-29 ENCOUNTER — Other Ambulatory Visit: Payer: Self-pay | Admitting: Family Medicine

## 2017-07-29 MED ORDER — PROMETHAZINE HCL 25 MG PO TABS: 25.0000 mg | ORAL_TABLET | Freq: Three times a day (TID) | ORAL | 0 refills | Status: DC | PRN

## 2017-07-29 NOTE — Telephone Encounter (Signed)
Refill done.  

## 2017-07-29 NOTE — Telephone Encounter (Signed)
Refill denied. Per pt he does not want this rx to go to [facility].

## 2017-08-04 ENCOUNTER — Other Ambulatory Visit: Payer: Self-pay | Admitting: Internal Medicine

## 2017-08-04 ENCOUNTER — Encounter: Payer: Self-pay | Admitting: Internal Medicine

## 2017-08-04 DIAGNOSIS — R945 Abnormal results of liver function studies: Principal | ICD-10-CM

## 2017-08-04 DIAGNOSIS — R739 Hyperglycemia, unspecified: Secondary | ICD-10-CM

## 2017-08-04 DIAGNOSIS — R7989 Other specified abnormal findings of blood chemistry: Secondary | ICD-10-CM

## 2017-08-05 MED ORDER — AMPHETAMINE-DEXTROAMPHET ER 30 MG PO CP24: 30.0000 mg | ORAL_CAPSULE | ORAL | 0 refills | Status: DC

## 2017-08-05 NOTE — Addendum Note (Signed)
Addended by: Zenovia JordanMITCHELL, TAYLOR B on: 08/05/2017 09:07 AM   Modules accepted: Orders

## 2017-08-05 NOTE — Telephone Encounter (Signed)
[place] Controlled Substance Database checked. Last filled on 06/24/2017

## 2017-08-10 ENCOUNTER — Encounter: Payer: Self-pay | Admitting: Internal Medicine

## 2017-08-11 ENCOUNTER — Other Ambulatory Visit (INDEPENDENT_AMBULATORY_CARE_PROVIDER_SITE_OTHER): Payer: BC Managed Care – PPO

## 2017-08-11 DIAGNOSIS — R945 Abnormal results of liver function studies: Secondary | ICD-10-CM

## 2017-08-11 DIAGNOSIS — R739 Hyperglycemia, unspecified: Secondary | ICD-10-CM | POA: Diagnosis not present

## 2017-08-11 DIAGNOSIS — R7989 Other specified abnormal findings of blood chemistry: Secondary | ICD-10-CM

## 2017-08-11 LAB — COMPREHENSIVE METABOLIC PANEL
ALBUMIN: 4.7 g/dL (ref 3.5–5.2)
ALK PHOS: 92 U/L (ref 39–117)
ALT: 48 U/L (ref 0–53)
AST: 28 U/L (ref 0–37)
BILIRUBIN TOTAL: 0.6 mg/dL (ref 0.2–1.2)
BUN: 14 mg/dL (ref 6–23)
CALCIUM: 9.9 mg/dL (ref 8.4–10.5)
CO2: 31 mEq/L (ref 19–32)
Chloride: 99 mEq/L (ref 96–112)
Creatinine, Ser: 1.23 mg/dL (ref 0.40–1.50)
GFR: 68.53 mL/min (ref >60.00)
Glucose, Bld: 91 mg/dL (ref 70–99)
Potassium: 4.2 mEq/L (ref 3.5–5.1)
Sodium: 136 mEq/L (ref 135–145)
TOTAL PROTEIN: 7.3 g/dL (ref 6.0–8.3)

## 2017-08-11 LAB — HEMOGLOBIN A1C: Hgb A1c MFr Bld: 5.5 % (ref 4.6–6.5)

## 2017-08-13 ENCOUNTER — Encounter: Payer: Self-pay | Admitting: Internal Medicine

## 2017-08-15 ENCOUNTER — Other Ambulatory Visit: Payer: Self-pay | Admitting: Family Medicine

## 2017-08-20 ENCOUNTER — Encounter: Payer: Self-pay | Admitting: Internal Medicine

## 2017-09-14 ENCOUNTER — Other Ambulatory Visit: Payer: Self-pay | Admitting: Family Medicine

## 2017-09-14 ENCOUNTER — Other Ambulatory Visit: Payer: Self-pay | Admitting: Internal Medicine

## 2017-09-14 NOTE — Telephone Encounter (Signed)
Refill done.  

## 2017-10-09 ENCOUNTER — Other Ambulatory Visit: Payer: Self-pay | Admitting: Internal Medicine

## 2017-10-09 ENCOUNTER — Encounter: Payer: Self-pay | Admitting: Internal Medicine

## 2017-10-09 MED ORDER — AMPHETAMINE-DEXTROAMPHET ER 30 MG PO CP24: 30.0000 mg | ORAL_CAPSULE | ORAL | 0 refills | Status: DC

## 2017-10-09 MED ORDER — LOSARTAN POTASSIUM-HCTZ 50-12.5 MG PO TABS: 1.0000 | ORAL_TABLET | Freq: Every day | ORAL | 0 refills | Status: DC

## 2017-10-09 NOTE — Telephone Encounter (Signed)
[place] Controlled Substance Database checked. Last filled on 08/05/17 

## 2017-10-09 NOTE — Telephone Encounter (Signed)
BP med has been sent to [facility].

## 2017-10-10 MED ORDER — HYDROCODONE-HOMATROPINE 5-1.5 MG/5ML PO SYRP: 5.0000 mL | ORAL_SOLUTION | Freq: Three times a day (TID) | ORAL | 0 refills | Status: DC | PRN

## 2017-10-14 ENCOUNTER — Other Ambulatory Visit: Payer: Self-pay | Admitting: Family Medicine

## 2017-10-14 NOTE — Telephone Encounter (Signed)
Refill done.  

## 2017-12-03 ENCOUNTER — Other Ambulatory Visit: Payer: Self-pay | Admitting: Internal Medicine

## 2017-12-03 MED ORDER — AMPHETAMINE-DEXTROAMPHET ER 30 MG PO CP24: 30.0000 mg | ORAL_CAPSULE | ORAL | 0 refills | Status: DC

## 2017-12-03 NOTE — Telephone Encounter (Signed)
[place] Controlled Substance Database checked. Last filled on 10/10/17

## 2017-12-09 ENCOUNTER — Encounter: Payer: Self-pay | Admitting: Internal Medicine

## 2017-12-10 MED ORDER — BUTALBITAL-APAP-CAFFEINE 50-325-40 MG PO TABS: 1.0000 | ORAL_TABLET | Freq: Four times a day (QID) | ORAL | 0 refills | Status: DC | PRN

## 2018-01-15 ENCOUNTER — Encounter: Payer: Self-pay | Admitting: Internal Medicine

## 2018-01-15 ENCOUNTER — Other Ambulatory Visit: Payer: Self-pay | Admitting: Internal Medicine

## 2018-01-16 MED ORDER — PROMETHAZINE HCL 25 MG PO TABS: 25.0000 mg | ORAL_TABLET | Freq: Three times a day (TID) | ORAL | 0 refills | Status: DC | PRN

## 2018-01-27 ENCOUNTER — Other Ambulatory Visit: Payer: Self-pay | Admitting: Internal Medicine

## 2018-01-28 MED ORDER — AMPHETAMINE-DEXTROAMPHET ER 30 MG PO CP24: 30.0000 mg | ORAL_CAPSULE | ORAL | 0 refills | Status: DC

## 2018-01-28 NOTE — Telephone Encounter (Signed)
[place] Controlled Substance Database checked. Last filled on 12/10/17 

## 2018-04-10 ENCOUNTER — Encounter: Payer: Self-pay | Admitting: Family Medicine

## 2018-04-10 ENCOUNTER — Ambulatory Visit: Payer: BC Managed Care – PPO | Admitting: Family Medicine

## 2018-04-10 VITALS — BP 124/76 | HR 76 | Temp 98.7°F | Ht 67.0 in | Wt 191.0 lb

## 2018-04-10 DIAGNOSIS — R599 Enlarged lymph nodes, unspecified: Secondary | ICD-10-CM

## 2018-04-10 MED ORDER — DICLOFENAC SODIUM 75 MG PO TBEC: 75.0000 mg | DELAYED_RELEASE_TABLET | Freq: Two times a day (BID) | ORAL | 0 refills | Status: DC

## 2018-04-10 NOTE — Patient Instructions (Addendum)
It was very nice to see you today!  You have a reactive lymph node.  There are several things that can cause this.  They are typically benign.  We will watch it for another few weeks.  Please let your primary know if your symptoms worsen or do not improve over the next few weeks.  We will also send in diclofenac to use as needed.  Take care, Dr Jimmey RalphParker  Lymphadenopathy Lymphadenopathy refers to swollen or enlarged lymph glands, also called lymph nodes. Lymph glands are part of your body's defense (immune) system, which protects the body from infections, germs, and diseases. Lymph glands are found in many locations in your body, including the neck, underarm, and groin. Many things can cause lymph glands to become enlarged. When your immune system responds to germs, such as viruses or bacteria, infection-fighting cells and fluid build up. This causes the glands to grow in size. Usually, this is not something to worry about. The swelling and any soreness often go away without treatment. However, swollen lymph glands can also be caused by a number of diseases. Your health care provider may do various tests to help determine the cause. If the cause of your swollen lymph glands cannot be found, it is important to monitor your condition to make sure the swelling goes away. Follow these instructions at home: Watch your condition for any changes. The following actions may help to lessen any discomfort you are feeling:  Get plenty of rest.  Take medicines only as directed by your health care provider. Your health care provider may recommend over-the-counter medicines for pain.  Apply moist heat compresses to the site of swollen lymph nodes as directed by your health care provider. This can help reduce any pain.  Check your lymph nodes daily for any changes.  Keep all follow-up visits as directed by your health care provider. This is important.  Contact a health care provider if:  Your lymph nodes are  still swollen after 2 weeks.  Your swelling increases or spreads to other areas.  Your lymph nodes are hard, seem fixed to the skin, or are growing rapidly.  Your skin over the lymph nodes is red and inflamed.  You have a fever.  You have chills.  You have fatigue.  You develop a sore throat.  You have abdominal pain.  You have weight loss.  You have night sweats. Get help right away if:  You notice fluid leaking from the area of the enlarged lymph node.  You have severe pain in any area of your body.  You have chest pain.  You have shortness of breath. This information is not intended to replace advice given to you by your health care provider. Make sure you discuss any questions you have with your health care provider. Document Released: 06/17/2008 Document Revised: 02/14/2016 Document Reviewed: 04/13/2014 Elsevier Interactive Patient Education  [employer]2018 Elsevier Inc.

## 2018-04-10 NOTE — Progress Notes (Signed)
   Subjective:  Tony [name] is a 42 y.o. male who presents today for same-day appointment with a chief complaint of swollen lymph nodes.   HPI:  Swollen Lymph Nodes, Acute problem Started about a week ago. Worsened over that time. Located to right neck. Area is tender. Associated with night sweats. No fevers or chills.  No obvious precipitating events.  No recent illnesses. Tried warm compresses which have not helped.  No other specific treatments tried.  No body aches.  No other obvious alleviating or aggravating factors.  ROS: Per HPI  PMH: He reports that he quit smoking about 9 years ago. His smoking use included cigarettes. He has a 0.72 pack-year smoking history. He has never used smokeless tobacco. He reports that he drinks alcohol. He reports that he does not use drugs.  Objective:  Physical Exam: BP 124/76 (BP Location: Left Arm, Patient Position: Sitting, Cuff Size: Normal)   Pulse 76   Temp 98.7 F (37.1 C) (Oral)   Ht 5\' 7"  (1.702 m)   Wt 191 lb (86.6 kg)   SpO2 96%   BMI 29.91 kg/m   Gen: NAD, resting comfortably HEENT: Mild tenderness and fullness to right submandibular area.  No anterior or posterior cervical LAD.  No supraclavicular LAD.  Mouth without abnormalities.  TMs clear bilaterally.   MSK: Axilla without abnormality. CV: RRR with no murmurs appreciated Pulm: NWOB, CTAB with no crackles, wheezes, or rhonchi  Assessment/Plan:  Reactive lymph node No red flag signs or symptoms. He was recently working out in the yard and had some exposure to contact irritants-this is the most likely source of his reactive lymph node.  Discussed diagnostic options with patient at this point including CBC and blood work.  Given that symptoms have been present for only a week, we will continue with watchful waiting.  I will send in a prescription for diclofenac to help him with his pain.  Discussed reasons to return to care including worsening pain, swelling, or failure to improve  over the next few weeks. Follow up as needed.  Katina Degreealeb M. Jimmey RalphParker, MD 04/10/2018 10:24 AM

## 2018-05-01 ENCOUNTER — Other Ambulatory Visit: Payer: Self-pay | Admitting: Internal Medicine

## 2018-05-25 ENCOUNTER — Other Ambulatory Visit: Payer: Self-pay

## 2018-05-25 MED ORDER — LOSARTAN POTASSIUM-HCTZ 50-12.5 MG PO TABS: 1.0000 | ORAL_TABLET | Freq: Every day | ORAL | 0 refills | Status: DC

## 2018-05-30 NOTE — Patient Instructions (Addendum)
  Test(s) ordered today. Your results will be released to MyChart (or called to you) after review, usually within 72hours after test completion. If any changes need to be made, you will be notified at that same time.  Flu immunization administered today.    Medications reviewed and updated.  Changes include:  Sertraline 50 mg for one week and then stop, when you stop the sertralien started effexor   Your prescription(s) have been submitted to your pharmacy. Please take as directed and contact our office if you believe you are having problem(s) with the medication(s).  Please followup in 6 months

## 2018-05-30 NOTE — Progress Notes (Signed)
Subjective:    Patient ID: Tony [name], male    DOB: 1976/06/08, 42 y.o.   MRN: [id number]  HPI The patient is here for follow up.  ADD:  He is taking his medication as prescribed.  He feels the medication is effective.  He denies side effects, including palpitations, headaches, lightheadedness, decreased appetite and weight loss.   Anxiety: He is taking his medication daily as prescribed. He denies any side effects from the medication.  He does not feel his anxiety is well controlled, but manageable.  The clonazepam does not seem to be as effective as the alprazolam was.  Depression: He is taking his medication daily as prescribed. He denies any side effects from the medication.  He does not feel the sertraline is that effective anymore.  He does wonder about changing the medication to something different.  Overweight: He did try Saxenda for a while.  He is currently not exercising regularly.  He is not eating as well as he should.  He did wonder about Adipex.  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  Hypertension: He is taking his medication daily. He is compliant with a low sodium diet.  He denies chest pain, palpitations, edema, shortness of breath and regular headaches. He is not exercising regularly.     GERD:  He is taking his medication daily as prescribed.  He denies any GERD symptoms and feels his GERD is well controlled.   Asthma, mild intermittent: He has occasional wheeze.  He uses his inhaler as needed, which improves his symptoms.  He only uses the inhaler on occasion.  Feels his asthma is well controlled.  Hyperglycemia: He is not eating as well as he should and he has gained weight.  He tends to snack more than he should.  He is not currently exercising.   Medications and allergies reviewed with patient and updated if appropriate.  Patient Active Problem List   Diagnosis Date Noted  . Hepatitis, acute 07/24/2017  . Sepsis (HCC) 07/07/2017  .  Abnormal LFTs 07/07/2017  . Abdominal pain 07/07/2017  . Hypokalemia 07/07/2017  . Esophageal reflux 06/17/2016  . Nonallopathic lesion of cervical region 04/02/2016  . Nonallopathic lesion of thoracic region 04/02/2016  . Nonallopathic lesion-rib cage 04/02/2016  . Pain in right acromioclavicular joint 02/21/2016  . Chronic upper back pain 12/18/2015  . Major depressive disorder, recurrent episode, moderate (HCC) 05/31/2015  . Overweight   . Asthma, mild intermittent 03/31/2013  . Migraine   . Obstructive sleep apnea   . Generalized anxiety disorder   . Seasonal and perennial allergic rhinitis   . Essential hypertension   . ADD (attention deficit disorder)     Current Outpatient Medications on File Prior to Visit  Medication Sig Dispense Refill  . acyclovir ointment (ZOVIRAX) 5 % APPLY TOPICALLY EVERY 3 HOURS 15 g 0  . albuterol (PROVENTIL) (2.5 MG/3ML) 0.083% nebulizer solution Take 3 mLs (2.5 mg total) by nebulization every 6 (six) hours as needed for wheezing or shortness of breath. 75 mL 12  . amphetamine-dextroamphetamine (ADDERALL XR) 30 MG 24 hr capsule Take 1 capsule (30 mg total) by mouth every morning. -- Office visit needed for further refills 30 capsule 0  . butalbital-acetaminophen-caffeine (FIORICET, ESGIC) 50-325-40 MG tablet Take 1-2 tablets by mouth every 6 (six) hours as needed for migraine. 20 tablet 0  . clonazePAM (KLONOPIN) 0.5 MG tablet Take 1 tablet (0.5 mg total) by mouth 2 (two) times daily. 180  tablet 0  . cyclobenzaprine (FLEXERIL) 5 MG tablet Take 1 tablet (5 mg total) by mouth 3 (three) times daily as needed for muscle spasms. 90 tablet 1  . diclofenac (VOLTAREN) 75 MG EC tablet Take 1 tablet (75 mg total) by mouth 2 (two) times daily. 30 tablet 0  . fluticasone (FLONASE) 50 MCG/ACT nasal spray USE TWO SPRAY IN NOSE ONCE DAILY 16 g 5  . hydrOXYzine (ATARAX/VISTARIL) 25 MG tablet Take 1 tablet (25 mg total) by mouth 3 (three) times daily as needed. 30  tablet 0  . Ibuprofen-Famotidine 800-26.6 MG TABS Take 1 tablet 3 times a day. 270 tablet 1  . Insulin Pen Needle (BD ULTRA-FINE MICRO PEN NEEDLE) 32G X 6 MM MISC Use with Saxenda 100 each 1  . levalbuterol (XOPENEX HFA) 45 MCG/ACT inhaler Inhale 2 puffs into the lungs every 6 (six) hours as needed for wheezing. 3 Inhaler 0  . losartan-hydrochlorothiazide (HYZAAR) 50-12.5 MG tablet Take 1 tablet by mouth daily. -- Office visit needed for further refills 30 tablet 0  . pantoprazole (PROTONIX) 40 MG tablet TAKE ONE TABLET BY MOUTH DAILY 90 tablet 1  . promethazine (PHENERGAN) 25 MG tablet Take 1 tablet (25 mg total) by mouth every 8 (eight) hours as needed for nausea or vomiting. 20 tablet 0  . SAXENDA 18 MG/3ML SOPN INJECT 3MG  INTO SKIN DAILY 45 mL 3  . sertraline (ZOLOFT) 100 MG tablet TAKE 1 TABLET DAILY 90 tablet 1  . SUMAtriptan (IMITREX) 6 MG/0.5ML SOLN injection Inject 0.5 mLs (6 mg total) into the skin every 2 (two) hours as needed for migraine or headache. May repeat in 2 hrs if headache persists 0.5 mL 5  . tiZANidine (ZANAFLEX) 4 MG tablet TAKE ONE TABLET BY MOUTH THREE TIMES A DAY 90 tablet 0   No current facility-administered medications on file prior to visit.     Past Medical History:  Diagnosis Date  . Allergic rhinitis   . Anxiety    overlap with depression and ADD  . Asthma   . Hepatitis A   . Hypertension   . Migraine   . Migraines   . OSA on CPAP    Sleep study 12/2011: severe, CPAP    Past Surgical History:  Procedure Laterality Date  . LASIK  2005  . WISDOM TOOTH EXTRACTION      Social History   Socioeconomic History  . Marital status: Single    Spouse name: Not on file  . Number of children: Not on file  . Years of education: Not on file  . Highest education level: Not on file  Occupational History  . Occupation: RESIDENT Copy: [employer]  Social Needs  . Financial resource strain: Not on file  . Food insecurity:      Worry: Not on file    Inability: Not on file  . Transportation needs:    Medical: Not on file    Non-medical: Not on file  Tobacco Use  . Smoking status: Former Smoker    Packs/day: 0.24    Years: 3.00    Pack years: 0.72    Types: Cigarettes    Last attempt to quit: 09/22/2008    Years since quitting: 9.6  . Smokeless tobacco: Never Used  Substance and Sexual Activity  . Alcohol use: Yes    Alcohol/week: 0.0 standard drinks    Comment: OCCASSIONAL  . Drug use: No  . Sexual activity: Not on  file  Lifestyle  . Physical activity:    Days per week: Not on file    Minutes per session: Not on file  . Stress: Not on file  Relationships  . Social connections:    Talks on phone: Not on file    Gets together: Not on file    Attends religious service: Not on file    Active member of club or organization: Not on file    Attends meetings of clubs or organizations: Not on file    Relationship status: Not on file  Other Topics Concern  . 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 Hypertension Mother   . Arthritis Father   . Hypertension Father   . Lung cancer Maternal Grandfather   . Diabetes Other   . Emphysema Paternal Grandfather   . Asthma Paternal Grandfather     Review of Systems  Constitutional: Negative for chills and fever.  Respiratory: Negative for cough, shortness of breath and wheezing.   Cardiovascular: Negative for chest pain, palpitations and leg swelling.  Neurological: Negative for light-headedness and headaches.       Objective:   Vitals:   06/01/18 1120  BP: (!) 142/94  Pulse: 71  Resp: 16  Temp: 98.1 F (36.7 C)  SpO2: 98%   BP Readings from Last 3 Encounters:  06/01/18 (!) 142/94  04/10/18 124/76  07/24/17 138/88   Wt Readings from Last 3 Encounters:  06/01/18 195 lb 9.6 oz (88.7 kg)  04/10/18 191 lb (86.6 kg)   07/24/17 179 lb (81.2 kg)   Body mass index is 30.64 kg/m.   Physical Exam    Constitutional: Appears well-developed and well-nourished. No distress.  HENT:  Head: Normocephalic and atraumatic.  Neck: Neck supple. No tracheal deviation present. No thyromegaly present.  No cervical lymphadenopathy Cardiovascular: Normal rate, regular rhythm and normal heart sounds.   No murmur heard. No carotid bruit .  No edema Pulmonary/Chest: Effort normal and breath sounds normal. No respiratory distress. No has no wheezes. No rales.  Skin: Skin is warm and dry. Not diaphoretic.  Psychiatric: Normal mood and affect. Behavior is normal.      Assessment & Plan:    See Problem List for Assessment and Plan of chronic medical problems.

## 2018-06-01 ENCOUNTER — Encounter: Payer: Self-pay | Admitting: Internal Medicine

## 2018-06-01 ENCOUNTER — Ambulatory Visit (INDEPENDENT_AMBULATORY_CARE_PROVIDER_SITE_OTHER): Payer: BC Managed Care – PPO | Admitting: Internal Medicine

## 2018-06-01 ENCOUNTER — Other Ambulatory Visit (INDEPENDENT_AMBULATORY_CARE_PROVIDER_SITE_OTHER): Payer: BC Managed Care – PPO

## 2018-06-01 VITALS — BP 142/94 | HR 71 | Temp 98.1°F | Resp 16 | Ht 67.0 in | Wt 195.6 lb

## 2018-06-01 DIAGNOSIS — K219 Gastro-esophageal reflux disease without esophagitis: Secondary | ICD-10-CM | POA: Diagnosis not present

## 2018-06-01 DIAGNOSIS — R739 Hyperglycemia, unspecified: Secondary | ICD-10-CM

## 2018-06-01 DIAGNOSIS — Z23 Encounter for immunization: Secondary | ICD-10-CM

## 2018-06-01 DIAGNOSIS — F411 Generalized anxiety disorder: Secondary | ICD-10-CM | POA: Diagnosis not present

## 2018-06-01 DIAGNOSIS — I10 Essential (primary) hypertension: Secondary | ICD-10-CM

## 2018-06-01 DIAGNOSIS — E663 Overweight: Secondary | ICD-10-CM

## 2018-06-01 DIAGNOSIS — F988 Other specified behavioral and emotional disorders with onset usually occurring in childhood and adolescence: Secondary | ICD-10-CM | POA: Diagnosis not present

## 2018-06-01 DIAGNOSIS — F3289 Other specified depressive episodes: Secondary | ICD-10-CM

## 2018-06-01 LAB — CBC WITH DIFFERENTIAL/PLATELET
Basophils Absolute: 0 10*3/uL (ref 0.0–0.1)
Basophils Relative: 0.7 % (ref 0.0–3.0)
EOS ABS: 0.2 10*3/uL (ref 0.0–0.7)
EOS PCT: 2.9 % (ref 0.0–5.0)
HCT: 44.9 % (ref 39.0–52.0)
HEMOGLOBIN: 15.5 g/dL (ref 13.0–17.0)
LYMPHS ABS: 1.6 10*3/uL (ref 0.7–4.0)
Lymphocytes Relative: 28.3 % (ref 12.0–46.0)
MCHC: 34.5 g/dL (ref 30.0–36.0)
MCV: 82.2 fl (ref 78.0–100.0)
MONO ABS: 0.5 10*3/uL (ref 0.1–1.0)
Monocytes Relative: 9.7 % (ref 3.0–12.0)
NEUTROS PCT: 58.4 % (ref 43.0–77.0)
Neutro Abs: 3.3 10*3/uL (ref 1.4–7.7)
Platelets: 286 10*3/uL (ref 150.0–400.0)
RBC: 5.46 Mil/uL (ref 4.22–5.81)
RDW: 14.5 % (ref 11.5–15.5)
WBC: 5.6 10*3/uL (ref 4.0–10.5)

## 2018-06-01 LAB — COMPREHENSIVE METABOLIC PANEL
ALBUMIN: 4.7 g/dL (ref 3.5–5.2)
ALT: 43 U/L (ref 0–53)
AST: 20 U/L (ref 0–37)
Alkaline Phosphatase: 72 U/L (ref 39–117)
BILIRUBIN TOTAL: 0.5 mg/dL (ref 0.2–1.2)
BUN: 16 mg/dL (ref 6–23)
CO2: 28 mEq/L (ref 19–32)
Calcium: 9.7 mg/dL (ref 8.4–10.5)
Chloride: 99 mEq/L (ref 96–112)
Creatinine, Ser: 1.09 mg/dL (ref 0.40–1.50)
GFR: 78.48 mL/min (ref >60.00)
GLUCOSE: 92 mg/dL (ref 70–99)
POTASSIUM: 4.1 meq/L (ref 3.5–5.1)
SODIUM: 134 meq/L — AB (ref 135–145)
Total Protein: 7.5 g/dL (ref 6.0–8.3)

## 2018-06-01 LAB — LIPID PANEL
CHOLESTEROL: 199 mg/dL (ref 0–200)
HDL: 61.6 mg/dL (ref >39.00)
LDL CALC: 116 mg/dL — AB (ref 0–99)
NonHDL: 137.26
TRIGLYCERIDES: 107 mg/dL (ref 0.0–149.0)
Total CHOL/HDL Ratio: 3
VLDL: 21.4 mg/dL (ref 0.0–40.0)

## 2018-06-01 LAB — HEMOGLOBIN A1C: HEMOGLOBIN A1C: 5.8 % (ref 4.6–6.5)

## 2018-06-01 MED ORDER — PANTOPRAZOLE SODIUM 40 MG PO TBEC: 40.0000 mg | DELAYED_RELEASE_TABLET | Freq: Every day | ORAL | 1 refills | Status: DC

## 2018-06-01 MED ORDER — LOSARTAN POTASSIUM-HCTZ 50-12.5 MG PO TABS: 1.0000 | ORAL_TABLET | Freq: Every day | ORAL | 1 refills | Status: DC

## 2018-06-01 MED ORDER — AMPHETAMINE-DEXTROAMPHET ER 30 MG PO CP24: 30.0000 mg | ORAL_CAPSULE | ORAL | 0 refills | Status: DC

## 2018-06-01 MED ORDER — VENLAFAXINE HCL ER 37.5 MG PO CP24: ORAL_CAPSULE | ORAL | 3 refills | Status: DC

## 2018-06-01 NOTE — Assessment & Plan Note (Signed)
GERD controlled Continue daily medication  

## 2018-06-01 NOTE — Assessment & Plan Note (Addendum)
Depression not ideally controlled-sertraline does not seem to be effective Sertraline 50 mg for one week and then stop effexor 37.5 mg daily - increase to 75 mg daily after two weeks Follow up in 6 months, sooner if needed.  He will update me via MyChart how he is doing

## 2018-06-01 NOTE — Assessment & Plan Note (Signed)
He continues to struggle with his weight, which is partially related to stress and depression and working full-time and working on his dissertation Advised trying to make some food choices He is not a candidate for Adipex

## 2018-06-01 NOTE — Assessment & Plan Note (Signed)
Blood pressure slightly elevated here today Encouraged him to make adjustments to his diet Work on weight loss Will not change any medication right now

## 2018-06-01 NOTE — Assessment & Plan Note (Addendum)
Controlled, stable Continue current dose of medication Medication refills Follow-up in 6 months

## 2018-06-01 NOTE — Assessment & Plan Note (Signed)
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

## 2018-06-01 NOTE — Assessment & Plan Note (Signed)
Not eating as well as he should Has gained weight Will check A1c

## 2018-06-03 ENCOUNTER — Encounter: Payer: Self-pay | Admitting: Internal Medicine

## 2018-06-03 DIAGNOSIS — R7303 Prediabetes: Secondary | ICD-10-CM | POA: Insufficient documentation

## 2018-06-11 ENCOUNTER — Other Ambulatory Visit: Payer: Self-pay | Admitting: Internal Medicine

## 2018-06-14 ENCOUNTER — Other Ambulatory Visit: Payer: Self-pay | Admitting: Internal Medicine

## 2018-06-14 MED ORDER — CLONAZEPAM 0.5 MG PO TABS: 0.5000 mg | ORAL_TABLET | Freq: Two times a day (BID) | ORAL | 0 refills | Status: DC

## 2018-06-14 MED ORDER — BUTALBITAL-APAP-CAFFEINE 50-325-40 MG PO TABS: 1.0000 | ORAL_TABLET | Freq: Four times a day (QID) | ORAL | 0 refills | Status: DC | PRN

## 2018-06-14 NOTE — Telephone Encounter (Signed)
[place] Controlled Substance Database checked. Last filled on 06/09/17  Last OV

## 2018-06-16 NOTE — Progress Notes (Signed)
[name] [address] [place], [place] [address] Phone: [phone] Subjective:     CC: Right shoulder pain and neck pain follow-up  [id number]  Tony [name] is a 42 y.o. male coming in with complaint of neck and right shoulder pain. Does have pain the majority of his day. Pain in the right trap. Does use ice, heat and massage. Pain does radiate up into his neck. Does have headaches but is unsure of their etiology.  Has been nearly 1 year since we have seen patient.  Had been doing very well but no worsening pain is waking him up at night.  Starting to affect daily activities.  Patient has been sitting writing a paper on a much more regular basis.      Past Medical History:  Diagnosis Date  . Allergic rhinitis   . Anxiety    overlap with depression and ADD  . Asthma   . Hepatitis A   . Hypertension   . Migraine   . Migraines   . OSA on CPAP    Sleep study 12/2011: severe, CPAP   Past Surgical History:  Procedure Laterality Date  . LASIK  2005  . WISDOM TOOTH EXTRACTION     Social History   Socioeconomic History  . Marital status: Single    Spouse name: Not on file  . Number of children: Not on file  . Years of education: Not on file  . Highest education level: Not on file  Occupational History  . Occupation: RESIDENT Copy: [employer]  Social Needs  . Financial resource strain: Not on file  . Food insecurity:    Worry: Not on file    Inability: Not on file  . Transportation needs:    Medical: Not on file    Non-medical: Not on file  Tobacco Use  . Smoking status: Former Smoker    Packs/day: 0.24    Years: 3.00    Pack years: 0.72    Types: Cigarettes    Last attempt to quit: 09/22/2008    Years since quitting: 9.7  . Smokeless tobacco: Never Used  Substance and Sexual Activity  . Alcohol use: Yes    Alcohol/week: 0.0 standard drinks    Comment: OCCASSIONAL  . Drug use: No  . Sexual  activity: Not on file  Lifestyle  . Physical activity:    Days per week: Not on file    Minutes per session: Not on file  . Stress: Not on file  Relationships  . Social connections:    Talks on phone: Not on file    Gets together: Not on file    Attends religious service: Not on file    Active member of club or organization: Not on file    Attends meetings of clubs or organizations: Not on file    Relationship status: Not on file  Other Topics Concern  . Not on file  Social History Narrative   Single, 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, international aid at [employer]   Allergies  Allergen Reactions  . Erythromycin     Pt states med make him sick   Family History  Problem Relation Age of Onset  . Hypertension Mother   . Arthritis Father   . Hypertension Father   . Lung cancer Maternal Grandfather   . Diabetes Other   . Emphysema Paternal Grandfather   . Asthma Paternal Grandfather  Current Outpatient Medications (Cardiovascular):  .  losartan-hydrochlorothiazide (HYZAAR) 50-12.5 MG tablet, Take 1 tablet by mouth daily. Marland Kitchen  losartan-hydrochlorothiazide (HYZAAR) 50-12.5 MG tablet, Take 1 tablet by mouth daily.  Current Outpatient Medications (Respiratory):  .  albuterol (PROVENTIL) (2.5 MG/3ML) 0.083% nebulizer solution, Take 3 mLs (2.5 mg total) by nebulization every 6 (six) hours as needed for wheezing or shortness of breath. .  fluticasone (FLONASE) 50 MCG/ACT nasal spray, USE TWO SPRAY IN NOSE ONCE DAILY .  levalbuterol (XOPENEX HFA) 45 MCG/ACT inhaler, Inhale 2 puffs into the lungs every 6 (six) hours as needed for wheezing.  Current Outpatient Medications (Analgesics):  .  butalbital-acetaminophen-caffeine (FIORICET, ESGIC) 50-325-40 MG tablet, Take 1-2 tablets by mouth every 6 (six) hours as needed for migraine. .  diclofenac (VOLTAREN) 75 MG EC tablet, Take 1 tablet (75 mg total) by mouth 2 (two) times daily. .  SUMAtriptan (IMITREX) 6 MG/0.5ML SOLN injection,  Inject 0.5 mLs (6 mg total) into the skin every 2 (two) hours as needed for migraine or headache. May repeat in 2 hrs if headache persists .  Ibuprofen-Famotidine 800-26.6 MG TABS, Take 1 tablet 3 times a day.   Current Outpatient Medications (Other):  .  acyclovir ointment (ZOVIRAX) 5 %, APPLY TOPICALLY EVERY 3 HOURS .  amphetamine-dextroamphetamine (ADDERALL XR) 30 MG 24 hr capsule, Take 1 capsule (30 mg total) by mouth every morning. .  clonazePAM (KLONOPIN) 0.5 MG tablet, Take 1 tablet (0.5 mg total) by mouth 2 (two) times daily. .  cyclobenzaprine (FLEXERIL) 5 MG tablet, Take 1 tablet (5 mg total) by mouth 3 (three) times daily as needed for muscle spasms. .  hydrOXYzine (ATARAX/VISTARIL) 25 MG tablet, Take 1 tablet (25 mg total) by mouth 3 (three) times daily as needed. .  Insulin Pen Needle (BD ULTRA-FINE MICRO PEN NEEDLE) 32G X 6 MM MISC, Use with Saxenda .  pantoprazole (PROTONIX) 40 MG tablet, Take 1 tablet (40 mg total) by mouth daily. Marland Kitchen  tiZANidine (ZANAFLEX) 4 MG tablet, Take 1 tablet (4 mg total) by mouth 3 (three) times daily. Marland Kitchen  venlafaxine XR (EFFEXOR XR) 37.5 MG 24 hr capsule, Take one tab daily for two weeks and then increase to 2 tabs daily    Past medical history, social, surgical and family history all reviewed in electronic medical record.  No pertanent information unless stated regarding to the chief complaint.   Review of Systems:  No headache, visual changes, nausea, vomiting, diarrhea, constipation, dizziness, abdominal pain, skin rash, fevers, chills, night sweats, weight loss, swollen lymph nodes, body aches, joint swelling, , chest pain, shortness of breath, mood changes.  Positive muscle aches  Objective  Blood pressure (!) 142/104, pulse 92, height 5\' 7"  (1.702 m), weight 195 lb (88.5 kg), SpO2 98 %.    General: No apparent distress alert and oriented x3 mood and affect normal, dressed appropriately.  HEENT: Pupils equal, extraocular movements intact    Respiratory: Patient's speak in full sentences and does not appear short of breath  Cardiovascular: No lower extremity edema, non tender, no erythema  Skin: Warm dry intact with no signs of infection or rash on extremities or on axial skeleton.  Abdomen: Soft nontender  Neuro: Cranial nerves II through XII are intact, neurovascularly intact in all extremities with 2+ DTRs and 2+ pulses.  Lymph: No lymphadenopathy of posterior or anterior cervical chain or axillae bilaterally.  Gait normal with good balance and coordination.  MSK:  Non tender with full range of motion and good stability  and symmetric strength and tone of , elbows, wrist, hip, knee and ankles bilaterally.   Shoulder: Right Inspection reveals no abnormalities, atrophy or asymmetry. Palpation is normal with no tenderness over AC joint or bicipital groove. ROM is full in all planes passively. Rotator cuff strength normal throughout. signs of impingement with positive Neer and Hawkin's tests, but negative empty can sign. Speeds and Yergason's tests normal. No labral pathology noted with negative Obrien's, negative clunk and good stability. Normal scapular function observed. No painful arc and no drop arm sign. No apprehension sign Contralateral shoulder unremarkable except for mild impingement  Neck exam shows some mild loss of lordosis.  Tightness with sidebending and rotation to the right.  Negative Spurling's.  Grip strength is full and symmetric   MSK US performed of: Right This study was ordered, performed, and interpreted by Terrilee FilesZach Smith D.O.  Shoulder:   Supraspinatus:  Appears normal on long and transverse views, Bursal bulge seen with shoulder abduction on impingement view. Infraspinatus:  Appears normal on long and transverse views. Significant increase in Doppler flow Subscapularis:  Appears normal on long and transverse views. Positive bursa Teres Minor:  Appears normal on long and transverse views. AC joint:   Capsule undistended, no geyser sign. Glenohumeral Joint:  Appears normal without effusion. Glenoid Labrum:  Intact without visualized tears. Biceps Tendon:  Appears normal on long and transverse views, no fraying of tendon, tendon located in intertubercular groove, no subluxation with shoulder internal or external rotation.  Impression: Subacromial bursitis  Procedure: Real-time Ultrasound Guided Injection of right glenohumeral joint Device: GE Logiq E  Ultrasound guided injection is preferred based studies that show increased duration, increased effect, greater accuracy, decreased procedural pain, increased response rate with ultrasound guided versus blind injection.  Verbal informed consent obtained.  Time-out conducted.  Noted no overlying erythema, induration, or other signs of local infection.  Skin prepped in a sterile fashion.  Local anesthesia: Topical Ethyl chloride.  With sterile technique and under real time ultrasound guidance:  Joint visualized.  23g 1  inch needle inserted posterior approach. Pictures taken for needle placement. Patient did have injection of 2 cc of 1% lidocaine, 2 cc of 0.5% Marcaine, and 1.0 cc of Kenalog 40 mg/dL. Completed without difficulty  Pain immediately resolved suggesting accurate placement of the medication.  Advised to call if fevers/chills, erythema, induration, drainage, or persistent bleeding.  Images permanently stored and available for review in the ultrasound unit.  Impression: Technically successful ultrasound guided injection.  Osteopathic findings  C2 flexed rotated and side bent right C4 flexed rotated and side bent left  T9 extended rotated and side bent left    Impression and Recommendations:     This case required medical decision making of moderate complexity. The above documentation has been reviewed and is accurate and complete Judi SaaZachary M Smith, DO       Note: This dictation was prepared with Dragon dictation along  with smaller phrase technology. Any transcriptional errors that result from this process are unintentional.

## 2018-06-17 ENCOUNTER — Ambulatory Visit: Payer: Self-pay

## 2018-06-17 ENCOUNTER — Ambulatory Visit (INDEPENDENT_AMBULATORY_CARE_PROVIDER_SITE_OTHER): Payer: BC Managed Care – PPO | Admitting: Family Medicine

## 2018-06-17 ENCOUNTER — Other Ambulatory Visit: Payer: Self-pay

## 2018-06-17 VITALS — BP 142/104 | HR 92 | Ht 67.0 in | Wt 195.0 lb

## 2018-06-17 DIAGNOSIS — M549 Dorsalgia, unspecified: Secondary | ICD-10-CM

## 2018-06-17 DIAGNOSIS — G8929 Other chronic pain: Secondary | ICD-10-CM

## 2018-06-17 DIAGNOSIS — M7551 Bursitis of right shoulder: Secondary | ICD-10-CM | POA: Diagnosis not present

## 2018-06-17 DIAGNOSIS — M25511 Pain in right shoulder: Principal | ICD-10-CM

## 2018-06-17 DIAGNOSIS — M999 Biomechanical lesion, unspecified: Secondary | ICD-10-CM | POA: Diagnosis not present

## 2018-06-17 MED ORDER — IBUPROFEN-FAMOTIDINE 800-26.6 MG PO TABS: ORAL_TABLET | ORAL | 1 refills | Status: DC

## 2018-06-17 MED ORDER — TIZANIDINE HCL 4 MG PO TABS: 4.0000 mg | ORAL_TABLET | Freq: Three times a day (TID) | ORAL | 0 refills | Status: DC

## 2018-06-17 NOTE — Patient Instructions (Addendum)
Good to see you  Ice is your friend  Ice 20 minutes 2 times daily. Usually after activity and before bed. Stay active Injected the shoulder  Refilled the zanaflex  Refilled duexis and they should clal you  See me when you need me

## 2018-06-18 ENCOUNTER — Encounter: Payer: Self-pay | Admitting: Family Medicine

## 2018-06-18 DIAGNOSIS — M7551 Bursitis of right shoulder: Secondary | ICD-10-CM | POA: Insufficient documentation

## 2018-06-18 NOTE — Assessment & Plan Note (Signed)
Secondary to posture and ergonomics.  We discussed different changes to make while he is changed her but she is sitting in writing.  Discussed the possibility of an adjustable standing desk.  Responds well to osteopathic manipulation.  Discussed icing regimen and home exercises.  Patient has muscle relaxers that were refilled as well.  Follow-up again in 4 weeks

## 2018-06-18 NOTE — Assessment & Plan Note (Signed)
Injected today.  Tolerated the procedure well.  Discussed sitting posture and ergonomics, topical anti-inflammatories, oral anti-inflammatories prescribed as well for short-term.  Follow-up again in 6 weeks if not completely resolved.  History of acromioclavicular joint pain in the knee injection there is well

## 2018-06-18 NOTE — Assessment & Plan Note (Signed)
Decision today to treat with OMT was based on Physical Exam  After verbal consent patient was treated with HVLA, ME, FPR techniques in cervical, thoracic, rib,  areas  Patient tolerated the procedure well with improvement in symptoms  Patient given exercises, stretches and lifestyle modifications  See medications in patient instructions if given  Patient will follow up in 4 weeks 

## 2018-07-22 ENCOUNTER — Other Ambulatory Visit: Payer: Self-pay | Admitting: Internal Medicine

## 2018-07-23 ENCOUNTER — Other Ambulatory Visit: Payer: Self-pay | Admitting: Internal Medicine

## 2018-07-23 MED ORDER — AMPHETAMINE-DEXTROAMPHET ER 30 MG PO CP24: 30.0000 mg | ORAL_CAPSULE | ORAL | 0 refills | Status: DC

## 2018-07-23 NOTE — Telephone Encounter (Signed)
[place] controlled substance database checked.  Ok to fill medication.  

## 2018-08-11 ENCOUNTER — Ambulatory Visit: Payer: BC Managed Care – PPO | Admitting: Internal Medicine

## 2018-08-13 ENCOUNTER — Ambulatory Visit: Payer: BC Managed Care – PPO | Admitting: Internal Medicine

## 2018-08-13 ENCOUNTER — Other Ambulatory Visit (INDEPENDENT_AMBULATORY_CARE_PROVIDER_SITE_OTHER): Payer: BC Managed Care – PPO

## 2018-08-13 ENCOUNTER — Ambulatory Visit (INDEPENDENT_AMBULATORY_CARE_PROVIDER_SITE_OTHER)
Admission: RE | Admit: 2018-08-13 | Discharge: 2018-08-13 | Disposition: A | Discharge status: Home / Self Care | Payer: BC Managed Care – PPO | Source: Ambulatory Visit | Attending: Internal Medicine | Admitting: Internal Medicine

## 2018-08-13 ENCOUNTER — Encounter: Payer: Self-pay | Admitting: Internal Medicine

## 2018-08-13 VITALS — BP 120/90 | HR 106 | Temp 98.7°F | Ht 67.0 in | Wt 186.0 lb

## 2018-08-13 DIAGNOSIS — R1110 Vomiting, unspecified: Secondary | ICD-10-CM

## 2018-08-13 DIAGNOSIS — R109 Unspecified abdominal pain: Secondary | ICD-10-CM

## 2018-08-13 LAB — CBC
HEMATOCRIT: 48.8 % (ref 39.0–52.0)
Hemoglobin: 16.6 g/dL (ref 13.0–17.0)
MCHC: 34 g/dL (ref 30.0–36.0)
MCV: 84.5 fl (ref 78.0–100.0)
PLATELETS: 364 10*3/uL (ref 150.0–400.0)
RBC: 5.77 Mil/uL (ref 4.22–5.81)
RDW: 14.3 % (ref 11.5–15.5)
WBC: 7.3 10*3/uL (ref 4.0–10.5)

## 2018-08-13 LAB — COMPREHENSIVE METABOLIC PANEL
ALK PHOS: 92 U/L (ref 39–117)
ALT: 59 U/L — ABNORMAL HIGH (ref 0–53)
AST: 27 U/L (ref 0–37)
Albumin: 5.3 g/dL — ABNORMAL HIGH (ref 3.5–5.2)
BUN: 16 mg/dL (ref 6–23)
CO2: 29 meq/L (ref 19–32)
Calcium: 10 mg/dL (ref 8.4–10.5)
Chloride: 96 mEq/L (ref 96–112)
Creatinine, Ser: 1.22 mg/dL (ref 0.40–1.50)
GFR: 68.85 mL/min (ref >60.00)
GLUCOSE: 99 mg/dL (ref 70–99)
POTASSIUM: 3.7 meq/L (ref 3.5–5.1)
SODIUM: 135 meq/L (ref 135–145)
TOTAL PROTEIN: 8 g/dL (ref 6.0–8.3)
Total Bilirubin: 0.8 mg/dL (ref 0.2–1.2)

## 2018-08-13 LAB — LIPASE: Lipase: 10 U/L — ABNORMAL LOW (ref 11.0–59.0)

## 2018-08-13 MED ORDER — PROMETHAZINE HCL 12.5 MG PO TABS: 12.5000 mg | ORAL_TABLET | Freq: Three times a day (TID) | ORAL | 0 refills | Status: DC | PRN

## 2018-08-13 NOTE — Assessment & Plan Note (Signed)
Diminished BS on exam with mild diffuse tenderness and vomiting without known last BM. Checking x-ray abdomen stat to rule out SBO and CBC, CMP, lipase for etiology and treat as appropriate.

## 2018-08-13 NOTE — Progress Notes (Signed)
   Subjective:    Patient ID: Tony [name], male    DOB: 1975-12-08, 42 y.o.   MRN: [id number]  HPI The patient is a 42 YO man coming in for vomiting and abdominal pain. Intermittent over the last 2-3 weeks or so. Appetite is poor and trouble keeping down food. Mostly no desire to eat. Some abdominal pain with eating midline. Having less BM since eating more and cannot recall last BM but not today. Some vomiting which can be hours after eating and those food particles are there. Denies pain in RUQ. CT and MRI reviewed from last year and no signs of gallstones or other abnormality. Prior episode of acute viral hepatitis last year. LFTs about 2 months ago normal. Denies vomiting up yellow stuff or bile. Denies blood in stool. Denies diarrhea. Denies fevers or chills. Taking protonix and denies missing doses. Has some old promethazine which has helped temporarily with vomiting. Tried effexor after last visit with PCP (Sept) for less than 1 week. Got side effects and stopped. Not taking anything not on med list. Is tired and low energy since not eating much. Drinking water only for fluids and this stays down okay.   Review of Systems  Constitutional: Negative.   HENT: Negative.   Eyes: Negative.   Respiratory: Negative for cough, chest tightness and shortness of breath.   Cardiovascular: Negative for chest pain, palpitations and leg swelling.  Gastrointestinal: Positive for abdominal pain, nausea and vomiting. Negative for abdominal distention, constipation and diarrhea.  Musculoskeletal: Negative.   Skin: Negative.   Neurological: Negative.   Psychiatric/Behavioral: Negative.       Objective:   Physical Exam  Constitutional: He is oriented to person, place, and time. He appears well-developed and well-nourished.  HENT:  Head: Normocephalic and atraumatic.  Eyes: EOM are normal.  Neck: Normal range of motion.  Cardiovascular: Normal rate and regular rhythm.  Pulmonary/Chest: Effort normal  and breath sounds normal. No respiratory distress. He has no wheezes. He has no rales.  Abdominal: Soft. He exhibits no distension and no mass. There is tenderness. There is no rebound and no guarding. No hernia.  Diminished BS, mild diffuse tenderness.   Musculoskeletal: He exhibits no edema.  Neurological: He is alert and oriented to person, place, and time. Coordination normal.  Skin: Skin is warm and dry.  Psychiatric: He has a normal mood and affect.   Vitals:   08/13/18 1506  BP: 120/90  Pulse: (!) 106  Temp: 98.7 F (37.1 C)  TempSrc: Oral  SpO2: 98%  Weight: 186 lb (84.4 kg)  Height: 5\' 7"  (1.702 m)      Assessment & Plan:

## 2018-08-13 NOTE — Patient Instructions (Signed)
We are checking the x-ray and the labs today and will call you back about the results.  We would like you to double the protonix for the next few days to 1 pill in the morning and 1 pill in the evening. We may adjust this plan based on the labs and x-ray

## 2018-08-16 ENCOUNTER — Encounter: Payer: Self-pay | Admitting: Internal Medicine

## 2018-08-17 ENCOUNTER — Ambulatory Visit: Payer: BC Managed Care – PPO | Admitting: Internal Medicine

## 2018-09-29 ENCOUNTER — Other Ambulatory Visit: Payer: Self-pay | Admitting: Internal Medicine

## 2018-09-29 ENCOUNTER — Other Ambulatory Visit: Payer: Self-pay | Admitting: Family Medicine

## 2018-09-29 MED ORDER — AMPHETAMINE-DEXTROAMPHET ER 30 MG PO CP24: 30.0000 mg | ORAL_CAPSULE | ORAL | 0 refills | Status: DC

## 2018-09-29 MED ORDER — TIZANIDINE HCL 4 MG PO TABS: 4.0000 mg | ORAL_TABLET | Freq: Three times a day (TID) | ORAL | 0 refills | Status: DC

## 2018-09-29 NOTE — Telephone Encounter (Signed)
[place] Controlled Substance Database checked. Last filled on 07/23/18.   Last OV 06/01/18 Next OV 10/11/18

## 2018-10-11 ENCOUNTER — Ambulatory Visit: Payer: BC Managed Care – PPO | Admitting: Internal Medicine

## 2018-10-14 NOTE — Patient Instructions (Addendum)
  Medications reviewed and updated.  Changes include :   Start alprazolam as needed.  Start sertraline 50 mg daily.  Take the allergy medications as prscribed.  Your prescription(s) have been submitted to your pharmacy. Please take as directed and contact our office if you believe you are having problem(s) with the medication(s).   Please followup in 6 months

## 2018-10-14 NOTE — Progress Notes (Signed)
Subjective:    Patient ID: Tony [name], male    DOB: October 23, 1975, 43 y.o.   MRN: [id number]  HPI The patient is here for follow up.  Allergic rhinitis:    He is not currently taking his allergy medication on a consistent basis.  His allergy symptoms are not controlled and very bothersome.  He wonders if there is anything new because most of the things he has tried over-the-counter are not effective.  He did allergy injection years ago.  He would like to avoid injections because of the are not convenient.  He does have a cold associated with his allergy symptoms, but no fevers or chills.  His most concerning allergy symptom is the drainage.  Hypertension: He is taking his medication daily. He is compliant with a low sodium diet.  He denies chest pain, palpitations, edema, shortness of breath and regular headaches. He is not exercising regularly.      Prediabetes:  He is compliant with a low sugar/carbohydrate diet.  He is not exercising regularly.  Anxiety, depression: He is feeling increased stress and anxiety.  He states there is likely some depression there as well.  He did not tolerate the Effexor and stopped taking it.  He does feel his stress level is high.  He thinks his episode of abdominal pain and vomiting last fall was related to stress.  He still vomits occasionally and then is fine.  He did not have any illness when he was not working over the winter break.  Again he thinks this could be related to stress, possibly the sinus drainage from his allergies could also be causing some other symptoms.  Prior to going on the Effexor he was on sertraline and he felt like that stopped working.  He wonders since he has been off of it for a little while if it would be more effective now.  He would like to retry it.  ADD:  He is taking his medication as prescribed.  He feels the medication is effective.  He denies side effects, including palpitations, headaches, lightheadedness, decreased  appetite and weight loss.    Medications and allergies reviewed with patient and updated if appropriate.  Patient Active Problem List   Diagnosis Date Noted  . Acute bursitis of right shoulder 06/18/2018  . Prediabetes 06/03/2018  . Hepatitis, acute 07/24/2017  . Abnormal LFTs 07/07/2017  . Esophageal reflux 06/17/2016  . Nonallopathic lesion of cervical region 04/02/2016  . Nonallopathic lesion of thoracic region 04/02/2016  . Nonallopathic lesion-rib cage 04/02/2016  . Pain in right acromioclavicular joint 02/21/2016  . Chronic upper back pain 12/18/2015  . Depression 05/31/2015  . Overweight   . Asthma, mild intermittent 03/31/2013  . Migraine   . Obstructive sleep apnea   . Generalized anxiety disorder   . Seasonal and perennial allergic rhinitis   . Essential hypertension   . ADD (attention deficit disorder)     Current Outpatient Medications on File Prior to Visit  Medication Sig Dispense Refill  . acyclovir ointment (ZOVIRAX) 5 % APPLY TOPICALLY EVERY 3 HOURS 15 g 0  . albuterol (PROVENTIL) (2.5 MG/3ML) 0.083% nebulizer solution Take 3 mLs (2.5 mg total) by nebulization every 6 (six) hours as needed for wheezing or shortness of breath. 75 mL 12  . amphetamine-dextroamphetamine (ADDERALL XR) 30 MG 24 hr capsule Take 1 capsule (30 mg total) by mouth every morning. 30 capsule 0  . butalbital-acetaminophen-caffeine (FIORICET, ESGIC) 50-325-40 MG tablet TAKE 1-2 TABLETS BY MOUTH  EVERY 6 HOURS AS NEEDED FOR MIGRAINE 20 tablet 0  . fluticasone (FLONASE) 50 MCG/ACT nasal spray USE TWO SPRAY IN NOSE ONCE DAILY 16 g 5  . Ibuprofen-Famotidine 800-26.6 MG TABS Take 1 tablet 3 times a day. 270 tablet 1  . Insulin Pen Needle (BD ULTRA-FINE MICRO PEN NEEDLE) 32G X 6 MM MISC Use with Saxenda 100 each 1  . levalbuterol (XOPENEX HFA) 45 MCG/ACT inhaler Inhale 2 puffs into the lungs every 6 (six) hours as needed for wheezing. 3 Inhaler 0  . losartan-hydrochlorothiazide (HYZAAR) 50-12.5 MG  tablet Take 1 tablet by mouth daily. 90 tablet 1  . pantoprazole (PROTONIX) 40 MG tablet Take 1 tablet (40 mg total) by mouth daily. 90 tablet 1  . SUMAtriptan (IMITREX) 6 MG/0.5ML SOLN injection Inject 0.5 mLs (6 mg total) into the skin every 2 (two) hours as needed for migraine or headache. May repeat in 2 hrs if headache persists 0.5 mL 5  . tiZANidine (ZANAFLEX) 4 MG tablet Take 1 tablet (4 mg total) by mouth 3 (three) times daily. 90 tablet 0   No current facility-administered medications on file prior to visit.     Past Medical History:  Diagnosis Date  . Allergic rhinitis   . Anxiety    overlap with depression and ADD  . Asthma   . Hepatitis A   . Hypertension   . Migraine   . Migraines   . OSA on CPAP    Sleep study 12/2011: severe, CPAP    Past Surgical History:  Procedure Laterality Date  . LASIK  2005  . WISDOM TOOTH EXTRACTION      Social History   Socioeconomic History  . Marital status: Single    Spouse name: Not on file  . Number of children: Not on file  . Years of education: Not on file  . Highest education level: Not on file  Occupational History  . Occupation: RESIDENT Copy: [employer]  Social Needs  . Financial resource strain: Not on file  . Food insecurity:    Worry: Not on file    Inability: Not on file  . Transportation needs:    Medical: Not on file    Non-medical: Not on file  Tobacco Use  . Smoking status: Former Smoker    Packs/day: 0.24    Years: 3.00    Pack years: 0.72    Types: Cigarettes    Last attempt to quit: 09/22/2008    Years since quitting: 10.0  . Smokeless tobacco: Never Used  Substance and Sexual Activity  . Alcohol use: Yes    Alcohol/week: 0.0 standard drinks    Comment: OCCASSIONAL  . Drug use: No  . Sexual activity: Not on file  Lifestyle  . Physical activity:    Days per week: Not on file    Minutes per session: Not on file  . Stress: Not on file  Relationships  .  Social connections:    Talks on phone: Not on file    Gets together: Not on file    Attends religious service: Not on file    Active member of club or organization: Not on file    Attends meetings of clubs or organizations: Not on file    Relationship status: Not on file  Other Topics Concern  . 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 Hypertension Mother   . Arthritis Father   . Hypertension Father   . Lung cancer Maternal Grandfather   . Diabetes Other   . Emphysema Paternal Grandfather   . Asthma Paternal Grandfather     Review of Systems  Constitutional: Negative for fever.  HENT: Positive for congestion, postnasal drip and rhinorrhea.   Respiratory: Positive for cough (allergy related). Negative for shortness of breath and wheezing.   Cardiovascular: Negative for chest pain, palpitations and leg swelling.  Neurological: Negative for light-headedness and headaches.       Objective:   Vitals:   10/15/18 1105  BP: 134/88  Pulse: (!) 102  Resp: 16  Temp: 98.8 F (37.1 C)  SpO2: 99%   BP Readings from Last 3 Encounters:  10/15/18 134/88  08/13/18 120/90  06/17/18 (!) 142/104   Wt Readings from Last 3 Encounters:  10/15/18 188 lb (85.3 kg)  08/13/18 186 lb (84.4 kg)  06/17/18 195 lb (88.5 kg)   Body mass index is 29.44 kg/m.   Physical Exam    Constitutional: Appears well-developed and well-nourished. No distress.  HENT:  Head: Normocephalic and atraumatic.  Neck: Neck supple. No tracheal deviation present. No thyromegaly present.  No cervical lymphadenopathy Cardiovascular: Normal rate, regular rhythm and normal heart sounds.   No murmur heard. No carotid bruit .  No edema Pulmonary/Chest: Effort normal and breath sounds normal. No respiratory distress. No has no wheezes. No rales.  Skin: Skin is warm and dry. Not diaphoretic.    Psychiatric: Normal mood and affect. Behavior is normal.      Assessment & Plan:    See Problem List for Assessment and Plan of chronic medical problems.

## 2018-10-15 ENCOUNTER — Encounter: Payer: Self-pay | Admitting: Internal Medicine

## 2018-10-15 ENCOUNTER — Ambulatory Visit (INDEPENDENT_AMBULATORY_CARE_PROVIDER_SITE_OTHER): Payer: BC Managed Care – PPO | Admitting: Internal Medicine

## 2018-10-15 ENCOUNTER — Telehealth: Payer: Self-pay | Admitting: Internal Medicine

## 2018-10-15 VITALS — BP 134/88 | HR 102 | Temp 98.8°F | Resp 16 | Ht 67.0 in | Wt 188.0 lb

## 2018-10-15 DIAGNOSIS — I10 Essential (primary) hypertension: Secondary | ICD-10-CM | POA: Diagnosis not present

## 2018-10-15 DIAGNOSIS — J302 Other seasonal allergic rhinitis: Secondary | ICD-10-CM

## 2018-10-15 DIAGNOSIS — R7303 Prediabetes: Secondary | ICD-10-CM | POA: Diagnosis not present

## 2018-10-15 DIAGNOSIS — F3289 Other specified depressive episodes: Secondary | ICD-10-CM

## 2018-10-15 DIAGNOSIS — F411 Generalized anxiety disorder: Secondary | ICD-10-CM

## 2018-10-15 DIAGNOSIS — J3089 Other allergic rhinitis: Secondary | ICD-10-CM

## 2018-10-15 DIAGNOSIS — F988 Other specified behavioral and emotional disorders with onset usually occurring in childhood and adolescence: Secondary | ICD-10-CM

## 2018-10-15 MED ORDER — CHLORPHENIRAMINE MALEATE 4 MG PO TABS: 4.0000 mg | ORAL_TABLET | Freq: Four times a day (QID) | ORAL | 3 refills | Status: DC | PRN

## 2018-10-15 MED ORDER — ALPRAZOLAM 0.5 MG PO TBDP: 0.5000 mg | ORAL_TABLET | Freq: Two times a day (BID) | ORAL | 0 refills | Status: DC | PRN

## 2018-10-15 MED ORDER — FLUTICASONE PROPIONATE 50 MCG/ACT NA SUSP: NASAL | 5 refills | Status: AC

## 2018-10-15 MED ORDER — SERTRALINE HCL 50 MG PO TABS: 50.0000 mg | ORAL_TABLET | Freq: Every day | ORAL | 1 refills | Status: DC

## 2018-10-15 MED ORDER — AZELASTINE-FLUTICASONE 137-50 MCG/ACT NA SUSP: 1.0000 | Freq: Two times a day (BID) | NASAL | 5 refills | Status: DC

## 2018-10-15 NOTE — Assessment & Plan Note (Signed)
Controlled, stable Continue current dose of medication  

## 2018-10-15 NOTE — Assessment & Plan Note (Signed)
Not controlled Clonazepam not working as well Wants to restart sertraline - restart at 50 mg daily Restart alprazolam daily as needed-advised him to only take this as needed and not on a regular basis Restart regular exercise up with stress Follow-up in 6 months or sooner if needed

## 2018-10-15 NOTE — Assessment & Plan Note (Signed)
Is having some increased depression, but mostly anxiety Restart sertraline 50 mg daily-we will titrate as needed

## 2018-10-15 NOTE — Assessment & Plan Note (Signed)
BP well controlled Current regimen effective and well tolerated Continue current medications at current doses  

## 2018-10-15 NOTE — Telephone Encounter (Signed)
Copied from CRM [id number]. Topic: Quick Communication - See Telephone Encounter >> Oct 15, 2018  3:19 PM Lorrine Kin, NT wrote: CRM for notification. See Telephone encounter for: 10/15/18. Pharmacy calling and states that they are needing the Azelastine-Fluticasone 137-50 MCG/ACT SUSP prescription resent. States they had a computer issue and it was accidentally deleted. Please advise. [address] - [place], [place] - [address]

## 2018-10-15 NOTE — Telephone Encounter (Signed)
Rx has been resent 

## 2018-10-15 NOTE — Assessment & Plan Note (Signed)
Has not been taking his medication on a daily basis, but also feels most of the medications he has taken are not effective Stressed taking them regularly Deferred allergy referral-would not like to start allergy injections at this time Try Dymista nasal spray daily Chlorpheniramine daily -if this is not effective we will try Xyzal Did not tolerate Singulair in the past-caused GERD

## 2018-10-15 NOTE — Assessment & Plan Note (Signed)
Low sugar/carbohydrate diet Stressed regular exercise He is working on weight loss We will recheck A1c at his next visit

## 2018-10-19 NOTE — Progress Notes (Signed)
Tawana ScaleZach Tiffny Gemmer D.O. [facility] [address] [place], [place] [address] Phone: [phone] Subjective:   [name], Valerie Wolf, am serving as a scribe for Dr. Antoine PrimasZachary Ladelle Teodoro.   CC: Bilateral shoulder pain, neck pain  LKG:[id number]:Subjective  Orland MustardStacy J Bevel is a 43 y.o. male coming in with complaint of neck and bilateral shoulder pain. Has been having an increase in pain one month ago. Denies any radicular symptoms. Has had relief from shoulder injections previously. Would like adjustment and injections today. Is concerned that he will not be able to come back as often due to an increase in co-pay.  Patient states that overall though has responded well to manipulation previously.  Also responded well to injections of the shoulders.      Past Medical History:  Diagnosis Date  . Allergic rhinitis   . Anxiety    overlap with depression and ADD  . Asthma   . Hepatitis A   . Hypertension   . Migraine   . Migraines   . OSA on CPAP    Sleep study 12/2011: severe, CPAP 10mmHg   Past Surgical History:  Procedure Laterality Date  . LASIK  2005  . WISDOM TOOTH EXTRACTION     Social History   Socioeconomic History  . Marital status: Single    Spouse name: Not on file  . Number of children: Not on file  . Years of education: Not on file  . Highest education level: Not on file  Occupational History  . Occupation: RESIDENT CopyHALL DIRECTOR    Employer: [employer]  Social Needs  . Financial resource strain: Not on file  . Food insecurity:    Worry: Not on file    Inability: Not on file  . Transportation needs:    Medical: Not on file    Non-medical: Not on file  Tobacco Use  . Smoking status: Former Smoker    Packs/day: 0.24    Years: 3.00    Pack years: 0.72    Types: Cigarettes    Last attempt to quit: 09/22/2008    Years since quitting: 10.0  . Smokeless tobacco: Never Used  Substance and Sexual Activity  . Alcohol use: Yes    Alcohol/week: 0.0 standard drinks   Comment: OCCASSIONAL  . Drug use: No  . Sexual activity: Not on file  Lifestyle  . Physical activity:    Days per week: Not on file    Minutes per session: Not on file  . Stress: Not on file  Relationships  . Social connections:    Talks on phone: Not on file    Gets together: Not on file    Attends religious service: Not on file    Active member of club or organization: Not on file    Attends meetings of clubs or organizations: Not on file    Relationship status: Not on file  Other Topics Concern  . 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 Erythromycin     Pt states med make him sick   Family History  Problem Relation Age of Onset  . Hypertension Mother   . Arthritis Father   . Hypertension Father   . Lung cancer Maternal Grandfather   . Diabetes Other   . Emphysema Paternal Grandfather   . Asthma Paternal Grandfather      Current Outpatient Medications (Cardiovascular):  .  losartan-hydrochlorothiazide (HYZAAR) 50-12.5 MG tablet, Take 1 tablet by mouth daily.  Current Outpatient Medications (Respiratory):  .  albuterol (PROVENTIL) (2.5 MG/3ML) 0.083% nebulizer solution, Take 3 mLs (2.5 mg total) by nebulization every 6 (six) hours as needed for wheezing or shortness of breath. .  Azelastine-Fluticasone 137-50 MCG/ACT SUSP, Place 1 spray into the nose 2 (two) times daily. .  chlorpheniramine (CHLOR-TRIMETON) 4 MG tablet, Take 1 tablet (4 mg total) by mouth every 6 (six) hours as needed for allergies. .  fluticasone (FLONASE) 50 MCG/ACT nasal spray, USE TWO SPRAY IN NOSE ONCE DAILY .  levalbuterol (XOPENEX HFA) 45 MCG/ACT inhaler, Inhale 2 puffs into the lungs every 6 (six) hours as needed for wheezing.  Current Outpatient Medications (Analgesics):  .  butalbital-acetaminophen-caffeine (FIORICET, ESGIC) 50-325-40 MG tablet, TAKE 1-2 TABLETS BY MOUTH EVERY 6  HOURS AS NEEDED FOR MIGRAINE .  Ibuprofen-Famotidine 800-26.6 MG TABS, Take 1 tablet 3 times a day. .  SUMAtriptan (IMITREX) 6 MG/0.5ML SOLN injection, Inject 0.5 mLs (6 mg total) into the skin every 2 (two) hours as needed for migraine or headache. May repeat in 2 hrs if headache persists   Current Outpatient Medications (Other):  .  acyclovir ointment (ZOVIRAX) 5 %, APPLY TOPICALLY EVERY 3 HOURS .  ALPRAZolam (NIRAVAM) 0.5 MG dissolvable tablet, Take 1 tablet (0.5 mg total) by mouth 2 (two) times daily as needed for anxiety. Marland Kitchen.  amphetamine-dextroamphetamine (ADDERALL XR) 30 MG 24 hr capsule, Take 1 capsule (30 mg total) by mouth every morning. .  Insulin Pen Needle (BD ULTRA-FINE MICRO PEN NEEDLE) 32G X 6 MM MISC, Use with Saxenda .  pantoprazole (PROTONIX) 40 MG tablet, Take 1 tablet (40 mg total) by mouth daily. .  sertraline (ZOLOFT) 50 MG tablet, Take 1 tablet (50 mg total) by mouth daily. Marland Kitchen.  tiZANidine (ZANAFLEX) 4 MG tablet, Take 1 tablet (4 mg total) by mouth 3 (three) times daily.    Past medical history, social, surgical and family history all reviewed in electronic medical record.  No pertanent information unless stated regarding to the chief complaint.   Review of Systems:  No headache, visual changes, nausea, vomiting, diarrhea, constipation, dizziness, abdominal pain, skin rash, fevers, chills, night sweats, weight loss, swollen lymph nodes, body aches, joint swelling, chest pain, shortness of breath, mood changes.  Positive muscle aches  Objective  Blood pressure (!) 158/120, pulse 92, height 5\' 7"  (1.702 m), weight 194 lb (88 kg), SpO2 98 %.   General: No apparent distress alert and oriented x3 mood and affect normal, dressed appropriately.  HEENT: Pupils equal, extraocular movements intact  Respiratory: Patient's speak in full sentences and does not appear short of breath  Cardiovascular: No lower extremity edema, non tender, no erythema  Skin: Warm dry intact with no  signs of infection or rash on extremities or on axial skeleton.  Abdomen: Soft nontender  Neuro: Cranial nerves II through XII are intact, neurovascularly intact in all extremities with 2+ DTRs and 2+ pulses.  Lymph: No lymphadenopathy of posterior or anterior cervical chain or axillae bilaterally.  Gait antalgic MSK:  Non tender with full range of motion and good stability and symmetric strength and tone of , elbows, wrist, hip, knee and ankles bilaterally.   Shoulder: Bilateral Inspection reveals no abnormalities, atrophy or asymmetry. Palpation is normal with no tenderness over AC joint or bicipital groove. ROM is full in all planes passively. Rotator cuff strength normal throughout. signs of impingement with positive Neer and Hawkin's tests, but negative  empty can sign. Speeds and Yergason's tests normal. No labral pathology noted with negative Obrien's, negative clunk and good stability. Normal scapular function observed. No painful arc and no drop arm sign. No apprehension sign   After informed written and verbal consent, patient was seated on exam table. Right shoulder was prepped with alcohol swab and utilizing posterior approach, patient's right glenohumeral space was injected with 4:1  marcaine 0.5%: Kenalog 40mg /dL. Patient tolerated the procedure well without immediate complications.  After informed written and verbal consent, patient was seated on exam table. Left shoulder was prepped with alcohol swab and utilizing posterior approach, patient's right glenohumeral space was injected with 4:1  marcaine 0.5%: Kenalog 40mg /dL. Patient tolerated the procedure well without immediate complications.  Osteopathic findings C2 flexed rotated and side bent right C6 flexed rotated and side bent left T3 extended rotated and side bent right inhaled third rib T5 extended rotated and side bent left L2 flexed rotated and side bent right Sacrum right on right     Impression and  Recommendations:     This case required medical decision making of moderate complexity. The above documentation has been reviewed and is accurate and complete Judi Saa, DO       Note: This dictation was prepared with Dragon dictation along with smaller phrase technology. Any transcriptional errors that result from this process are unintentional.

## 2018-10-20 ENCOUNTER — Encounter: Payer: Self-pay | Admitting: Family Medicine

## 2018-10-20 ENCOUNTER — Ambulatory Visit: Payer: BC Managed Care – PPO | Admitting: Family Medicine

## 2018-10-20 VITALS — BP 158/120 | HR 92 | Ht 67.0 in | Wt 194.0 lb

## 2018-10-20 DIAGNOSIS — M7551 Bursitis of right shoulder: Secondary | ICD-10-CM | POA: Diagnosis not present

## 2018-10-20 DIAGNOSIS — M7552 Bursitis of left shoulder: Secondary | ICD-10-CM | POA: Diagnosis not present

## 2018-10-20 DIAGNOSIS — M999 Biomechanical lesion, unspecified: Secondary | ICD-10-CM

## 2018-10-20 NOTE — Patient Instructions (Addendum)
Good to see you  Ice is your friend Exercises 3 times a week.  You should do well  See your primary care on your blood pressure please  MAke an appointment in 3 months in case you need me

## 2018-10-20 NOTE — Assessment & Plan Note (Signed)
Decision today to treat with OMT was based on Physical Exam  After verbal consent patient was treated with HVLA, ME, FPR techniques in cervical, thoracic, rib areas  Patient tolerated the procedure well with improvement in symptoms  Patient given exercises, stretches and lifestyle modifications  See medications in patient instructions if given  Patient will follow up in 4-8 weeks 

## 2018-10-20 NOTE — Assessment & Plan Note (Signed)
Injection given today.  Tolerated the procedure well.  Discussed icing regimen and home exercise.  Discussed which activities to do which was to avoid.  Increase activity slowly over the course of neck several weeks.  Follow-up with me again in 4 to 8 weeks

## 2018-10-21 ENCOUNTER — Encounter: Payer: Self-pay | Admitting: Family Medicine

## 2018-10-26 IMAGING — CT CT ABD-PELV W/ CM
2 of 5 series · 16 of 46 positions shown, 18 images · IV contrast (ISOVUE)
Comparison: Ultrasound abdomen 07/07/2017

CLINICAL DATA: Fever, body aches, nausea, diarrhea. Negative flu
test.

EXAM:
CT ABDOMEN AND PELVIS WITH CONTRAST
TECHNIQUE: Multidetector CT imaging of the abdomen and pelvis was performed
using the standard protocol following bolus administration of
intravenous contrast.
CONTRAST:  100 mL [id number]

[Series 2: abd/pel with · axial · 0.70mm/px · z∈[+1046,+1471]mm · 13 of 97 slices shown, 15 images]
[im 6/97  soft-tissue]
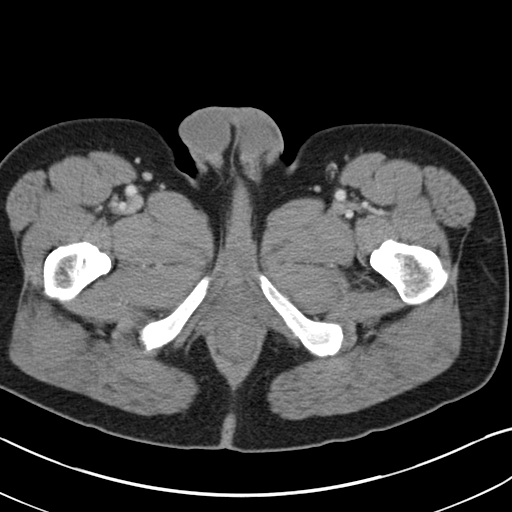
[im 6/97  bone]
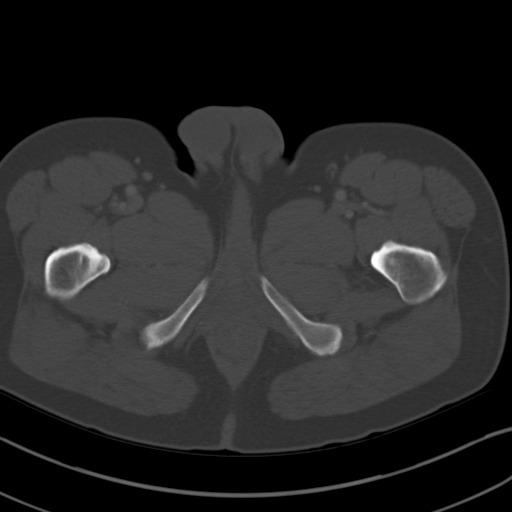
[im 12/97  soft-tissue]
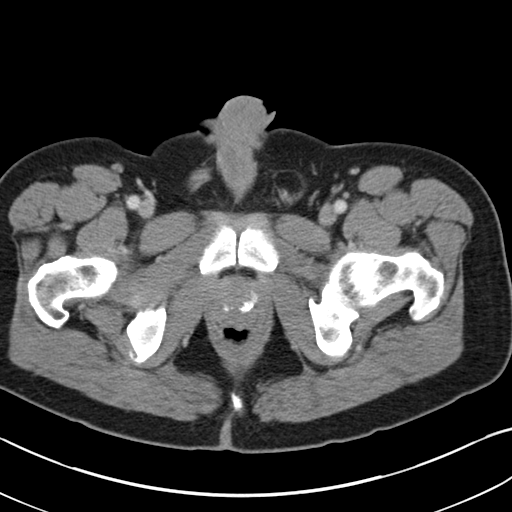
[im 23/97  soft-tissue]
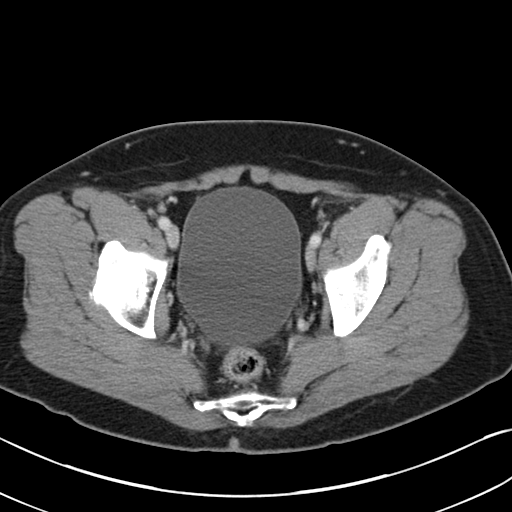
[im 29/97  soft-tissue]
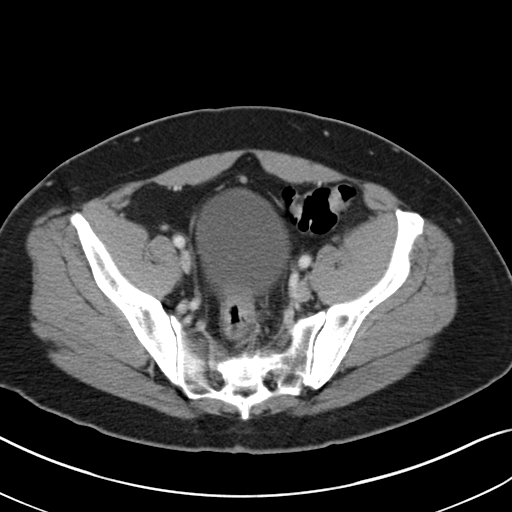
[im 34/97  soft-tissue]
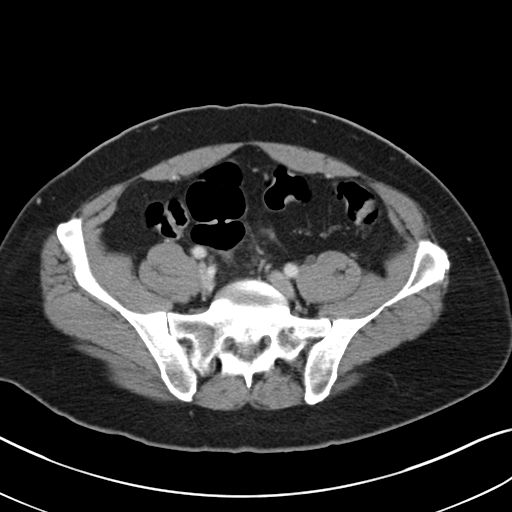
[im 40/97  soft-tissue]
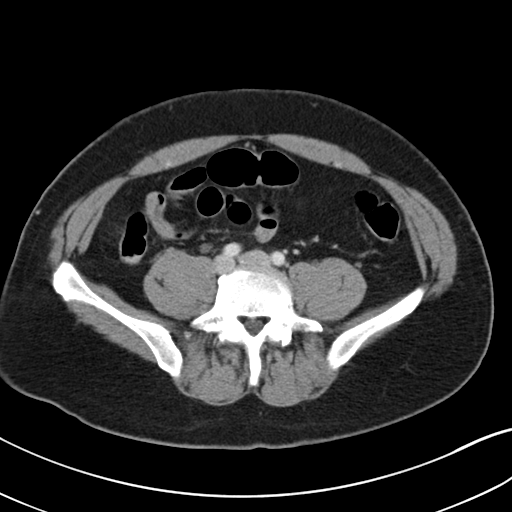
[im 51/97  soft-tissue]
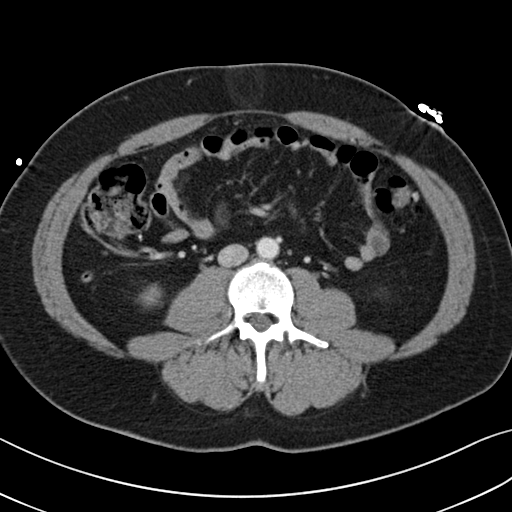
[im 57/97  soft-tissue]
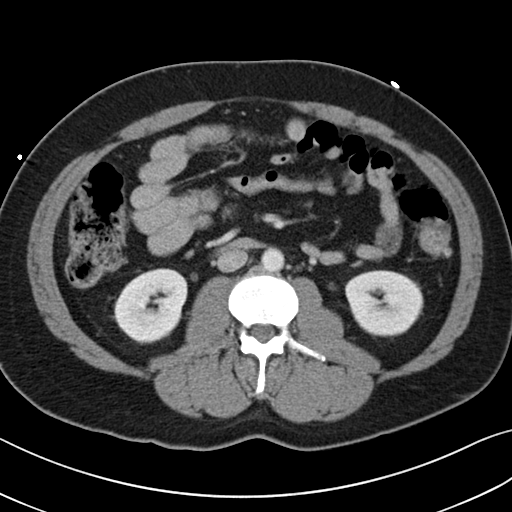
[im 63/97  soft-tissue]
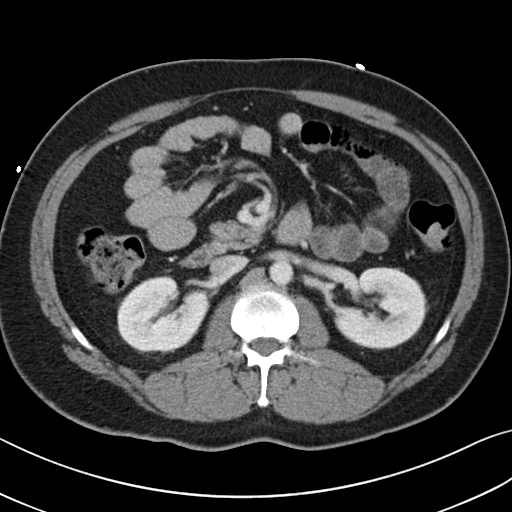
[im 63/97  bone]
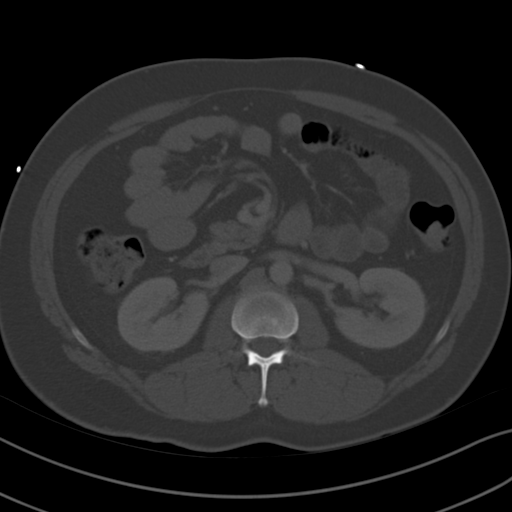
[im 68/97  soft-tissue]
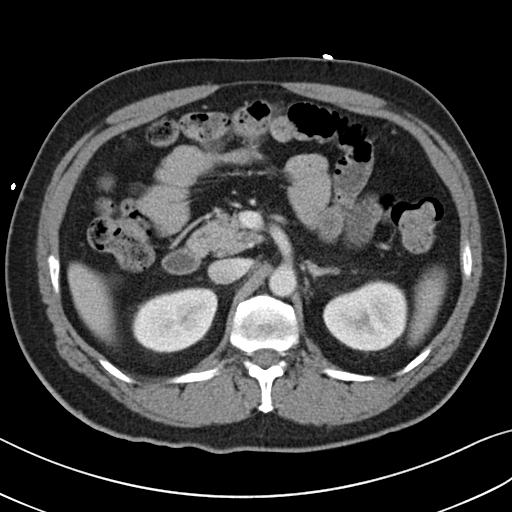
[im 74/97  soft-tissue]
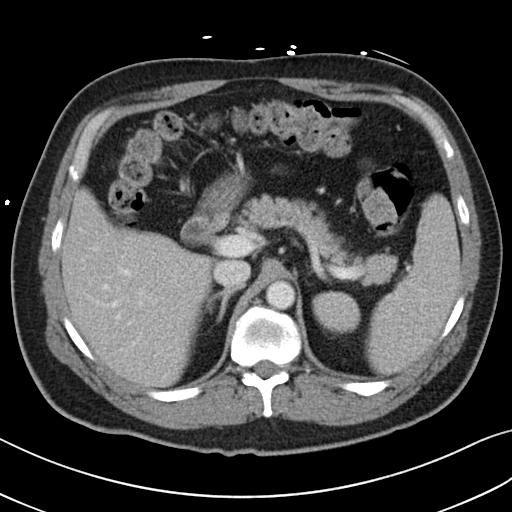
[im 85/97  soft-tissue]
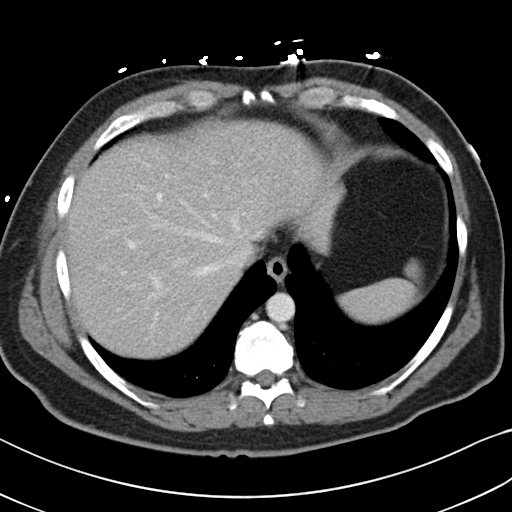
[im 91/97  soft-tissue]
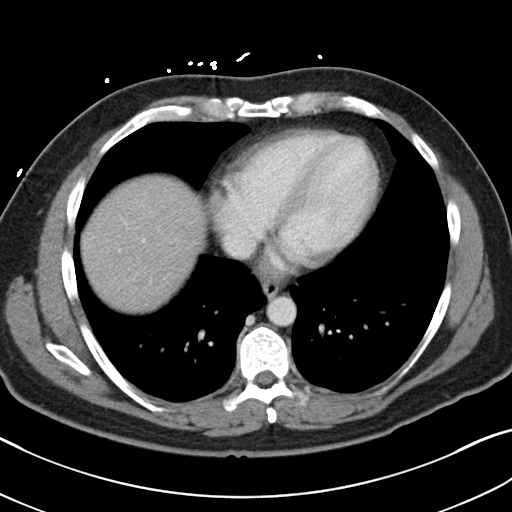

[Series 4: coronal a/|p · coronal · 0.67mm/px · 3 of 124 slices shown]
[im 42/124  soft-tissue]
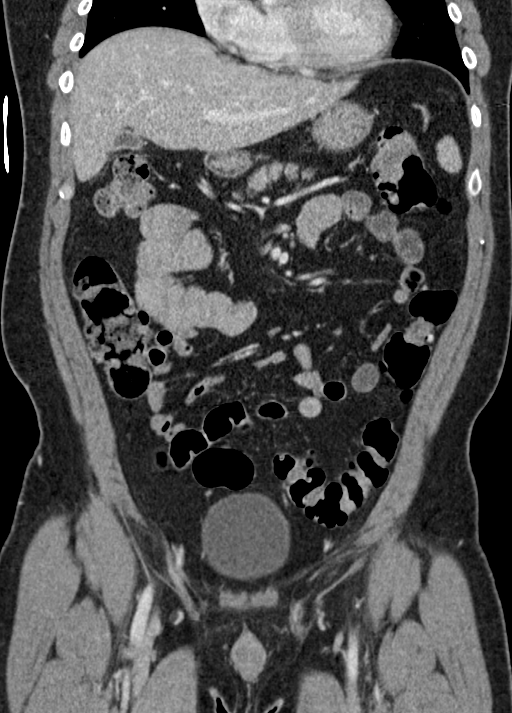
[im 55/124  soft-tissue]
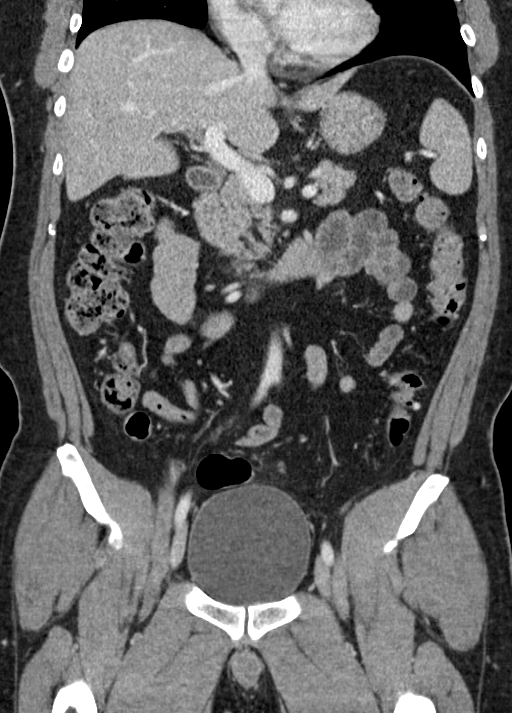
[im 69/124  soft-tissue]
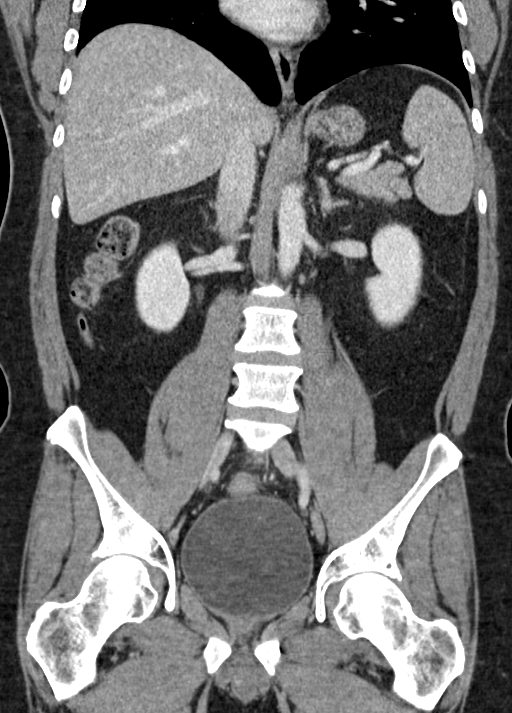

[16 of 46 positions shown; findings below may reference images not displayed]

FINDINGS: Lower chest: Mild dependent changes in the lung bases.

Hepatobiliary: Gallbladder is contracted, likely due to nonfasting
state. No bile duct dilatation. No focal liver lesions.

Pancreas: Unremarkable. No pancreatic ductal dilatation or
surrounding inflammatory changes.

Spleen: Normal in size without focal abnormality.

Adrenals/Urinary Tract: Adrenal glands are unremarkable. Kidneys are
normal, without renal calculi, focal lesion, or hydronephrosis.
Bladder is unremarkable.

Stomach/Bowel: Stomach is within normal limits. Appendix appears
normal. Colonic diverticula. No evidence of bowel wall thickening,
distention, or inflammatory changes.

Vascular/Lymphatic: No significant vascular findings are present. No
enlarged abdominal or pelvic lymph nodes.

Reproductive: Prostate is unremarkable.

Other: Small bilateral inguinal hernias containing fat. Small
periumbilical hernia containing fat. Infiltration in the herniated
fat at the periumbilical region may indicate fat necrosis. No bowel
herniation. No free air or free fluid in the abdomen.

Musculoskeletal: No acute or significant osseous findings.
IMPRESSION: 1. Small periumbilical hernia containing fat with possible fat
necrosis.
2. Small bilateral inguinal hernias containing fat.
3. No evidence of bowel obstruction or inflammation. Scattered
colonic diverticula.

## 2018-11-12 ENCOUNTER — Other Ambulatory Visit: Payer: Self-pay | Admitting: Internal Medicine

## 2018-11-15 MED ORDER — AMPHETAMINE-DEXTROAMPHET ER 30 MG PO CP24: 30.0000 mg | ORAL_CAPSULE | ORAL | 0 refills | Status: DC

## 2018-11-15 NOTE — Telephone Encounter (Signed)
Last refill was 10/01/18 Last OV 10/15/18 Next OV 04/18/19

## 2018-11-23 ENCOUNTER — Encounter: Payer: Self-pay | Admitting: Family Medicine

## 2018-11-23 ENCOUNTER — Encounter: Payer: Self-pay | Admitting: *Deleted

## 2018-11-23 ENCOUNTER — Ambulatory Visit: Payer: BC Managed Care – PPO | Admitting: Family Medicine

## 2018-11-23 VITALS — BP 128/72 | HR 80 | Temp 100.2°F | Ht 67.0 in | Wt 190.8 lb

## 2018-11-23 DIAGNOSIS — R6889 Other general symptoms and signs: Secondary | ICD-10-CM | POA: Diagnosis not present

## 2018-11-23 LAB — POC INFLUENZA A&B (BINAX/QUICKVUE)
INFLUENZA B, POC: NEGATIVE
Influenza A, POC: NEGATIVE

## 2018-11-23 MED ORDER — OSELTAMIVIR PHOSPHATE 75 MG PO CAPS: 75.0000 mg | ORAL_CAPSULE | Freq: Every day | ORAL | 0 refills | Status: DC

## 2018-11-23 MED ORDER — HYDROCODONE-HOMATROPINE 5-1.5 MG/5ML PO SYRP: 5.0000 mL | ORAL_SOLUTION | Freq: Two times a day (BID) | ORAL | 0 refills | Status: DC | PRN

## 2018-11-23 NOTE — Progress Notes (Signed)
HPI:  Using dictation device. Unfortunately this device frequently misinterprets words/phrases.   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  Past Medical History:  Diagnosis Date  . Allergic rhinitis   . Anxiety    overlap with depression and ADD  . Asthma   . Hepatitis A   . Hypertension   . Migraine   . Migraines   . OSA on CPAP    Sleep study 12/2011: severe, CPAP    Past Surgical History:  Procedure Laterality Date  . LASIK  2005  . WISDOM TOOTH EXTRACTION      Family History  Problem Relation Age of Onset  . Hypertension Mother   . Arthritis Father   . Hypertension Father   . Lung cancer Maternal Grandfather   . Diabetes Other   . Emphysema Paternal Grandfather   . Asthma Paternal Grandfather     Social History   Socioeconomic History  . Marital status: Single    Spouse name: Not on file  . Number of children: Not on file  . Years of education: Not on file  . Highest education level: Not on file  Occupational History  . Occupation: RESIDENT Copy: [employer]  Social Needs  . Financial resource strain: Not on file  . Food insecurity:    Worry: Not on file    Inability: Not on file  . Transportation needs:    Medical: Not on file    Non-medical: Not on file  Tobacco Use  . Smoking status: Former Smoker    Packs/day: 0.24      Years: 3.00    Pack years: 0.72    Types: Cigarettes    Last attempt to quit: 09/22/2008    Years since quitting: 10.1  . Smokeless tobacco: Never Used  Substance and Sexual Activity  . Alcohol use: Yes    Alcohol/week: 0.0 standard drinks    Comment: OCCASSIONAL  . Drug use: No  . Sexual activity: Not on file  Lifestyle  . Physical activity:    Days per week: Not on file    Minutes per session: Not on file  . Stress: Not on file  Relationships  . Social connections:    Talks on phone: Not on file    Gets together: Not on file    Attends religious service: Not on file    Active member of club or organization: Not on file    Attends meetings of clubs or organizations: Not on file    Relationship status: Not on file  Other Topics Concern  . 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  acyclovir ointment (ZOVIRAX) 5 %, APPLY TOPICALLY EVERY 3 HOURS, Disp: 15 g, Rfl: 0 .  albuterol (PROVENTIL) (2.5 MG/3ML) 0.083% nebulizer  solution, Take 3 mLs (2.5 mg total) by nebulization every 6 (six) hours as needed for wheezing or shortness of breath., Disp: 75 mL, Rfl: 12 .  ALPRAZolam (NIRAVAM) 0.5 MG dissolvable tablet, Take 1 tablet (0.5 mg total) by mouth 2 (two) times daily as needed for anxiety., Disp: 20 tablet, Rfl: 0 .  amphetamine-dextroamphetamine (ADDERALL XR) 30 MG 24 hr capsule, Take 1 capsule (30 mg total) by mouth every morning., Disp: 30 capsule, Rfl: 0 .  Azelastine-Fluticasone 137-50 MCG/ACT SUSP, Place 1 spray into the nose 2 (two) times daily., Disp: 23 g, Rfl: 5 .  butalbital-acetaminophen-caffeine (FIORICET, ESGIC) 50-325-40 MG tablet, TAKE 1-2 TABLETS BY MOUTH EVERY 6 HOURS AS NEEDED FOR MIGRAINE, Disp: 20 tablet, Rfl: 0 .  chlorpheniramine (CHLOR-TRIMETON) 4 MG tablet, Take 1 tablet (4 mg total) by mouth every 6 (six) hours as needed for allergies., Disp:  120 tablet, Rfl: 3 .  fluticasone (FLONASE) 50 MCG/ACT nasal spray, USE TWO SPRAY IN NOSE ONCE DAILY, Disp: 16 g, Rfl: 5 .  Ibuprofen-Famotidine 800-26.6 MG TABS, Take 1 tablet 3 times a day., Disp: 270 tablet, Rfl: 1 .  Insulin Pen Needle (BD ULTRA-FINE MICRO PEN NEEDLE) 32G X 6 MM MISC, Use with Saxenda, Disp: 100 each, Rfl: 1 .  levalbuterol (XOPENEX HFA) 45 MCG/ACT inhaler, Inhale 2 puffs into the lungs every 6 (six) hours as needed for wheezing., Disp: 3 Inhaler, Rfl: 0 .  losartan-hydrochlorothiazide (HYZAAR) 50-12.5 MG tablet, Take 1 tablet by mouth daily., Disp: 90 tablet, Rfl: 1 .  pantoprazole (PROTONIX) 40 MG tablet, Take 1 tablet (40 mg total) by mouth daily., Disp: 90 tablet, Rfl: 1 .  sertraline (ZOLOFT) 50 MG tablet, Take 1 tablet (50 mg total) by mouth daily., Disp: 90 tablet, Rfl: 1 .  SUMAtriptan (IMITREX) 6 MG/0.5ML SOLN injection, Inject 0.5 mLs (6 mg total) into the skin every 2 (two) hours as needed for migraine or headache. May repeat in 2 hrs if headache persists, Disp: 0.5 mL, Rfl: 5 .  tiZANidine (ZANAFLEX) 4 MG tablet, Take 1 tablet (4 mg total) by mouth 3 (three) times daily., Disp: 90 tablet, Rfl: 0 .  HYDROcodone-homatropine (HYCODAN) 5-1.5 MG/5ML syrup, Take 5 mLs by mouth every 12 (twelve) hours as needed for cough., Disp: 50 mL, Rfl: 0 .  oseltamivir (TAMIFLU) 75 MG capsule, Take 1 capsule (75 mg total) by mouth daily., Disp: 10 capsule, Rfl: 0  EXAM:  Vitals:   11/23/18 1424  BP: 128/72  Pulse: 80  Temp: 100.2 F (37.9 C)  SpO2: 99%    Body mass index is 29.88 kg/m.  GENERAL: vitals reviewed and listed above, alert, oriented, appears well hydrated and in no acute distress  HEENT: atraumatic, conjunttiva clear, no obvious abnormalities on inspection of external nose and ears, normal appearance of ear canals and TMs, clear nasal congestion, mild post oropharyngeal erythema with PND, no tonsillar edema or exudate, no sinus TTP  NECK: no obvious masses on  inspection  LUNGS: clear to auscultation bilaterally, no wheezes, rales or rhonchi, good air movement  CV: HRRR, no peripheral edema  MS: moves all extremities without noticeable abnormality  PSYCH: pleasant and cooperative, no obvious depression or anxiety  ASSESSMENT AND PLAN:  Discussed the following assessment and plan:  Influenza-like symptoms  We discussed potential/likely etiologies, with influenza being likely or possible vs. viral infection or other. We discussed risks/benefits/indications/best timing for tamiflu, symptomatic care, likely course, transmission, potential complications, signs of developing a serious illness and return and emergency  precuations.  He opted for tamiflu and rx sent. Also sent hycodan per his request after lengthy discussion risks/interactions. Advise ER evaluation if difficulty breathing, worsening, or very sick and follow up or call if any questions or concerns.     Patient Instructions  BEFORE YOU LEAVE: -flu testing and then patient will leave - call him with results. We are sending tamiflu regardless.  Take the Tamiflu as instructed.  Use the cough medication only as prescribe and as little as possible. Do not mix with sedative medications or alcohol or drive while taking this medication.  Nasal saline twice daily.  I hope you are feeling better soon! Seek emergency care promptly if your symptoms worsen, new concerns arise or you are not improving with treatment.    Influenza like illness, Adult Influenza is also called "the flu." It is an infection in the lungs, nose, and throat (respiratory tract). It is caused by a virus. The flu causes symptoms that are similar to symptoms of a cold. It also causes a high fever and body aches. The flu spreads easily from person to person (is contagious). Getting a flu shot (influenza vaccination) every year is the best way to prevent the flu. What are the causes? This condition is caused by the  influenza virus. You can get the virus by:  Breathing in droplets that are in the air from the cough or sneeze of a person who has the virus.  Touching something that has the virus on it (is contaminated) and then touching your mouth, nose, or eyes. What increases the risk? Certain things may make you more likely to get the flu. These include:  Not washing your hands often.  Having close contact with many people during cold and flu season.  Touching your mouth, eyes, or nose without first washing your hands.  Not getting a flu shot every year. You may have a higher risk for the flu, along with serious problems such as a lung infection (pneumonia), if you:  Are older than 65.  Are pregnant.  Have a weakened disease-fighting system (immune system) because of a disease or taking certain medicines.  Have a long-term (chronic) illness, such as: ? Heart, kidney, or lung disease. ? Diabetes. ? Asthma.  Have a liver disorder.  Are very overweight (morbidly obese).  Have anemia. This is a condition that affects your red blood cells. What are the signs or symptoms? Symptoms usually begin suddenly and last 4-14 days. They may include:  Fever and chills.  Headaches, body aches, or muscle aches.  Sore throat.  Cough.  Runny or stuffy (congested) nose.  Chest discomfort.  Not wanting to eat as much as normal (poor appetite).  Weakness or feeling tired (fatigue).  Dizziness.  Feeling sick to your stomach (nauseous) or throwing up (vomiting). How is this treated? If the flu is found early, you can be treated with medicine that can help reduce how bad the illness is and how long it lasts (antiviral medicine). This may be given by mouth (orally) or through an IV tube. Taking care of yourself at home can help your symptoms get better. Your doctor may suggest:  Taking over-the-counter medicines.  Drinking plenty of fluids. The flu often goes away on its own. If you have very  bad symptoms or other problems, you may be treated in a hospital. Follow these instructions at home:     Activity  Rest as needed. Get plenty of sleep.  Stay home from work or school  as told by your doctor. ? Do not leave home until you do not have a fever for 24 hours without taking medicine. ? Leave home only to visit your doctor. Eating and drinking  Take an ORS (oral rehydration solution). This is a drink that is sold at pharmacies and stores.  Drink enough fluid to keep your pee (urine) pale yellow.  Drink clear fluids in small amounts as you are able. Clear fluids include: ? Water. ? Ice chips. ? Fruit juice that has water added (diluted fruit juice). ? Low-calorie sports drinks.  Eat bland, easy-to-digest foods in small amounts as you are able. These foods include: ? Bananas. ? Applesauce. ? Rice. ? Lean meats. ? Toast. ? Crackers.  Do not eat or drink: ? Fluids that have a lot of sugar or caffeine. ? Alcohol. ? Spicy or fatty foods. General instructions  Take over-the-counter and prescription medicines only as told by your doctor.  Use a cool mist humidifier to add moisture to the air in your home. This can make it easier for you to breathe.  Cover your mouth and nose when you cough or sneeze.  Wash your hands with soap and water often, especially after you cough or sneeze. If you cannot use soap and water, use alcohol-based hand sanitizer.  Keep all follow-up visits as told by your doctor. This is important. How is this prevented?   Get a flu shot every year. You may get the flu shot in late summer, fall, or winter. Ask your doctor when you should get your flu shot.  Avoid contact with people who are sick during fall and winter (cold and flu season). Contact a doctor if:  You get new symptoms.  You have: ? Chest pain. ? Watery poop (diarrhea). ? A fever.  Your cough gets worse.  You start to have more mucus.  You feel sick to your  stomach.  You throw up. Get help right away if you at the emergency room - call ahead:  Have shortness of breath.  Have trouble breathing.  Have skin or nails that turn a bluish color.  Have very bad pain or stiffness in your neck.  Get a sudden headache.  Get sudden pain in your face or ear.  Cannot eat or drink without throwing up. Summary  Influenza ("the flu") is an infection in the lungs, nose, and throat. It is caused by a virus.  Take over-the-counter and prescription medicines only as told by your doctor.  Getting a flu shot every year is the best way to avoid getting the flu. This information is not intended to replace advice given to you by your health care provider. Make sure you discuss any questions you have with your health care provider. Document Released: 06/17/2008 Document Revised: 02/24/2018 Document Reviewed: 02/24/2018 Elsevier Interactive Patient Education  2019 [employer].    Terressa Koyanagi, DO

## 2018-11-23 NOTE — Addendum Note (Signed)
Addended by: Johnella Moloney on: 11/23/2018 03:08 PM   Modules accepted: Orders

## 2018-11-23 NOTE — Patient Instructions (Addendum)
BEFORE YOU LEAVE: -flu testing and then patient will leave - call him with results. We are sending tamiflu regardless.  Take the Tamiflu as instructed.  Use the cough medication only as prescribe and as little as possible. Do not mix with sedative medications or alcohol or drive while taking this medication.  Nasal saline twice daily.  I hope you are feeling better soon! Seek emergency care promptly if your symptoms worsen, new concerns arise or you are not improving with treatment.    Influenza like illness, Adult Influenza is also called "the flu." It is an infection in the lungs, nose, and throat (respiratory tract). It is caused by a virus. The flu causes symptoms that are similar to symptoms of a cold. It also causes a high fever and body aches. The flu spreads easily from person to person (is contagious). Getting a flu shot (influenza vaccination) every year is the best way to prevent the flu. What are the causes? This condition is caused by the influenza virus. You can get the virus by:  Breathing in droplets that are in the air from the cough or sneeze of a person who has the virus.  Touching something that has the virus on it (is contaminated) and then touching your mouth, nose, or eyes. What increases the risk? Certain things may make you more likely to get the flu. These include:  Not washing your hands often.  Having close contact with many people during cold and flu season.  Touching your mouth, eyes, or nose without first washing your hands.  Not getting a flu shot every year. You may have a higher risk for the flu, along with serious problems such as a lung infection (pneumonia), if you:  Are older than 65.  Are pregnant.  Have a weakened disease-fighting system (immune system) because of a disease or taking certain medicines.  Have a long-term (chronic) illness, such as: ? Heart, kidney, or lung disease. ? Diabetes. ? Asthma.  Have a liver  disorder.  Are very overweight (morbidly obese).  Have anemia. This is a condition that affects your red blood cells. What are the signs or symptoms? Symptoms usually begin suddenly and last 4-14 days. They may include:  Fever and chills.  Headaches, body aches, or muscle aches.  Sore throat.  Cough.  Runny or stuffy (congested) nose.  Chest discomfort.  Not wanting to eat as much as normal (poor appetite).  Weakness or feeling tired (fatigue).  Dizziness.  Feeling sick to your stomach (nauseous) or throwing up (vomiting). How is this treated? If the flu is found early, you can be treated with medicine that can help reduce how bad the illness is and how long it lasts (antiviral medicine). This may be given by mouth (orally) or through an IV tube. Taking care of yourself at home can help your symptoms get better. Your doctor may suggest:  Taking over-the-counter medicines.  Drinking plenty of fluids. The flu often goes away on its own. If you have very bad symptoms or other problems, you may be treated in a hospital. Follow these instructions at home:     Activity  Rest as needed. Get plenty of sleep.  Stay home from work or school as told by your doctor. ? Do not leave home until you do not have a fever for 24 hours without taking medicine. ? Leave home only to visit your doctor. Eating and drinking  Take an ORS (oral rehydration solution). This is a drink that is  sold at pharmacies and stores.  Drink enough fluid to keep your pee (urine) pale yellow.  Drink clear fluids in small amounts as you are able. Clear fluids include: ? Water. ? Ice chips. ? Fruit juice that has water added (diluted fruit juice). ? Low-calorie sports drinks.  Eat bland, easy-to-digest foods in small amounts as you are able. These foods include: ? Bananas. ? Applesauce. ? Rice. ? Lean meats. ? Toast. ? Crackers.  Do not eat or drink: ? Fluids that have a lot of sugar or  caffeine. ? Alcohol. ? Spicy or fatty foods. General instructions  Take over-the-counter and prescription medicines only as told by your doctor.  Use a cool mist humidifier to add moisture to the air in your home. This can make it easier for you to breathe.  Cover your mouth and nose when you cough or sneeze.  Wash your hands with soap and water often, especially after you cough or sneeze. If you cannot use soap and water, use alcohol-based hand sanitizer.  Keep all follow-up visits as told by your doctor. This is important. How is this prevented?   Get a flu shot every year. You may get the flu shot in late summer, fall, or winter. Ask your doctor when you should get your flu shot.  Avoid contact with people who are sick during fall and winter (cold and flu season). Contact a doctor if:  You get new symptoms.  You have: ? Chest pain. ? Watery poop (diarrhea). ? A fever.  Your cough gets worse.  You start to have more mucus.  You feel sick to your stomach.  You throw up. Get help right away if you at the emergency room - call ahead:  Have shortness of breath.  Have trouble breathing.  Have skin or nails that turn a bluish color.  Have very bad pain or stiffness in your neck.  Get a sudden headache.  Get sudden pain in your face or ear.  Cannot eat or drink without throwing up. Summary  Influenza ("the flu") is an infection in the lungs, nose, and throat. It is caused by a virus.  Take over-the-counter and prescription medicines only as told by your doctor.  Getting a flu shot every year is the best way to avoid getting the flu. This information is not intended to replace advice given to you by your health care provider. Make sure you discuss any questions you have with your health care provider. Document Released: 06/17/2008 Document Revised: 02/24/2018 Document Reviewed: 02/24/2018 Elsevier Interactive Patient Education  2019 [employer].

## 2018-12-01 ENCOUNTER — Ambulatory Visit: Payer: BC Managed Care – PPO | Admitting: Internal Medicine

## 2018-12-01 NOTE — Progress Notes (Signed)
Subjective:    Patient ID: Tony [name], male    DOB: 1976-01-30, 43 y.o.   MRN: [id number]  HPI The patient is here for follow up.  Coughing: He was seen last week at a different office for flulike symptoms.  He was tested negative for the flu.  He was prescribed Tamiflu and completed that.  He does feel better, but is still coughing.  He has used a cough syrup and would like a refill if possible because that does help him sleep.  He does feel tired and has an occasional wheeze.  He is using his inhaler, which is helping.  He has some residual nasal congestion.  He denies other symptoms.  1 of the main reasons he is here is to get a note from work stating that he is not contagious/can return to work.        Medications and allergies reviewed with patient and updated if appropriate.  Patient Active Problem List   Diagnosis Date Noted   Bilateral shoulder bursitis 10/20/2018   Acute bursitis of right shoulder 06/18/2018   Prediabetes 06/03/2018   Hepatitis, acute 07/24/2017   Abnormal LFTs 07/07/2017   Esophageal reflux 06/17/2016   Nonallopathic lesion of cervical region 04/02/2016   Nonallopathic lesion of thoracic region 04/02/2016   Nonallopathic lesion-rib cage 04/02/2016   Pain in right acromioclavicular joint 02/21/2016   Chronic upper back pain 12/18/2015   Depression 05/31/2015   Overweight    Asthma, mild intermittent 03/31/2013   Migraine    Obstructive sleep apnea    Generalized anxiety disorder    Seasonal and perennial allergic rhinitis    Essential hypertension    ADD (attention deficit disorder)     Current Outpatient Medications on File Prior to Visit  Medication Sig Dispense Refill   acyclovir ointment (ZOVIRAX) 5 % APPLY TOPICALLY EVERY 3 HOURS 15 g 0   albuterol (PROVENTIL) (2.5 MG/3ML) 0.083% nebulizer solution Take 3 mLs (2.5 mg total) by nebulization every 6 (six) hours as needed for wheezing or shortness of breath. 75 mL  12   ALPRAZolam (NIRAVAM) 0.5 MG dissolvable tablet Take 1 tablet (0.5 mg total) by mouth 2 (two) times daily as needed for anxiety. 20 tablet 0   amphetamine-dextroamphetamine (ADDERALL XR) 30 MG 24 hr capsule Take 1 capsule (30 mg total) by mouth every morning. 30 capsule 0   Azelastine-Fluticasone 137-50 MCG/ACT SUSP Place 1 spray into the nose 2 (two) times daily. 23 g 5   butalbital-acetaminophen-caffeine (FIORICET, ESGIC) 50-325-40 MG tablet TAKE 1-2 TABLETS BY MOUTH EVERY 6 HOURS AS NEEDED FOR MIGRAINE 20 tablet 0   chlorpheniramine (CHLOR-TRIMETON) 4 MG tablet Take 1 tablet (4 mg total) by mouth every 6 (six) hours as needed for allergies. 120 tablet 3   fluticasone (FLONASE) 50 MCG/ACT nasal spray USE TWO SPRAY IN NOSE ONCE DAILY 16 g 5   HYDROcodone-homatropine (HYCODAN) 5-1.5 MG/5ML syrup Take 5 mLs by mouth every 12 (twelve) hours as needed for cough. 50 mL 0   Ibuprofen-Famotidine 800-26.6 MG TABS Take 1 tablet 3 times a day. 270 tablet 1   Insulin Pen Needle (BD ULTRA-FINE MICRO PEN NEEDLE) 32G X 6 MM MISC Use with Saxenda 100 each 1   levalbuterol (XOPENEX HFA) 45 MCG/ACT inhaler Inhale 2 puffs into the lungs every 6 (six) hours as needed for wheezing. 3 Inhaler 0   losartan-hydrochlorothiazide (HYZAAR) 50-12.5 MG tablet Take 1 tablet by mouth daily. 90 tablet 1   oseltamivir (TAMIFLU) 75  MG capsule Take 1 capsule (75 mg total) by mouth daily. 10 capsule 0   pantoprazole (PROTONIX) 40 MG tablet Take 1 tablet (40 mg total) by mouth daily. 90 tablet 1   sertraline (ZOLOFT) 50 MG tablet Take 1 tablet (50 mg total) by mouth daily. 90 tablet 1   SUMAtriptan (IMITREX) 6 MG/0.5ML SOLN injection Inject 0.5 mLs (6 mg total) into the skin every 2 (two) hours as needed for migraine or headache. May repeat in 2 hrs if headache persists 0.5 mL 5   tiZANidine (ZANAFLEX) 4 MG tablet Take 1 tablet (4 mg total) by mouth 3 (three) times daily. 90 tablet 0   No current  facility-administered medications on file prior to visit.     Past Medical History:  Diagnosis Date   Allergic rhinitis    Anxiety    overlap with depression and ADD   Asthma    Hepatitis A    Hypertension    Migraine    Migraines    OSA on CPAP    Sleep study 12/2011: severe, CPAP    Past Surgical History:  Procedure Laterality Date   LASIK  2005   WISDOM TOOTH EXTRACTION      Social History   Socioeconomic History   Marital status: Single    Spouse name: Not on file   Number of children: Not on file   Years of education: Not on file   Highest education level: Not on file  Occupational History   Occupation: RESIDENT HALL DIRECTOR    Employer: [employer]  Social Needs   Financial resource strain: Not on file   Food insecurity:    Worry: Not on file    Inability: Not on file   Transportation needs:    Medical: Not on file    Non-medical: Not on file  Tobacco Use   Smoking status: Former Smoker    Packs/day: 0.24    Years: 3.00    Pack years: 0.72    Types: Cigarettes    Last attempt to quit: 09/22/2008    Years since quitting: 10.2   Smokeless tobacco: Never Used  Substance and Sexual Activity   Alcohol use: Yes    Alcohol/week: 0.0 standard drinks    Comment: OCCASSIONAL   Drug use: No   Sexual activity: Not on file  Lifestyle   Physical activity:    Days per week: Not on file    Minutes per session: Not on file   Stress: Not on file  Relationships   Social connections:    Talks on phone: Not on file    Gets together: Not on file    Attends religious service: Not on file    Active member of club or organization: Not on file    Attends meetings of clubs or organizations: Not on file    Relationship status: Not on file  Other Topics Concern   Not on file  Social History Narrative   Single, 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  HENT: Positive for congestion. Negative for ear pain, sinus pain and  sore throat.   Respiratory: Positive for cough (dry) and wheezing (occ). Negative for shortness of breath.   Cardiovascular: Negative for chest pain.  Neurological: Negative for headaches.       Objective:   Vitals:   12/02/18 1036  BP: 138/88  Pulse: 96  Resp: 16  Temp: 98.6 F (37 C)  SpO2: 98%   BP Readings from Last 3 Encounters:  12/02/18 138/88  11/23/18 128/72  10/20/18 (!) 158/120   Wt Readings from Last 3 Encounters:  12/02/18 187 lb (84.8 kg)  11/23/18 190 lb 12.8 oz (86.5 kg)  10/20/18 194 lb (88 kg)   Body mass index is 29.29 kg/m.   Physical Exam    Constitutional: Appears well-developed and well-nourished. No distress.  HENT:  Head: Normocephalic and atraumatic.  Neck: Neck supple. No tracheal deviation present. No thyromegaly present.  No cervical lymphadenopathy Cardiovascular: Normal rate, regular rhythm and normal heart sounds.   No murmur heard. No carotid bruit .  No edema Pulmonary/Chest: Effort normal and breath sounds normal. No respiratory distress. No has no wheezes. No rales.  Skin: Skin is warm and dry. Not diaphoretic.  Psychiatric: Normal mood and affect. Behavior is normal.      Assessment & Plan:    See Problem List for Assessment and Plan of chronic medical problems.

## 2018-12-02 ENCOUNTER — Encounter: Payer: Self-pay | Admitting: Internal Medicine

## 2018-12-02 ENCOUNTER — Ambulatory Visit: Payer: BC Managed Care – PPO | Admitting: Internal Medicine

## 2018-12-02 ENCOUNTER — Other Ambulatory Visit: Payer: Self-pay

## 2018-12-02 VITALS — BP 138/88 | HR 96 | Temp 98.6°F | Resp 16 | Ht 67.0 in | Wt 187.0 lb

## 2018-12-02 DIAGNOSIS — R05 Cough: Secondary | ICD-10-CM | POA: Diagnosis not present

## 2018-12-02 DIAGNOSIS — R059 Cough, unspecified: Secondary | ICD-10-CM | POA: Insufficient documentation

## 2018-12-02 MED ORDER — HYDROCODONE-HOMATROPINE 5-1.5 MG/5ML PO SYRP: 5.0000 mL | ORAL_SOLUTION | Freq: Two times a day (BID) | ORAL | 0 refills | Status: DC | PRN

## 2018-12-02 NOTE — Patient Instructions (Signed)
Use the cough syrup as needed.  Continue your inhaler.  Take over the counter cold medications as needed.   Continue increased rest and fluids.

## 2018-12-02 NOTE — Assessment & Plan Note (Signed)
Hycodan cough syrup renewed Continue albuterol as needed Continue allergy/over-the-counter cold medications as needed Continue increased rest and fluids Note provided for work saying that he can return to work Call if symptoms do not continue to improve and resolve

## 2018-12-12 ENCOUNTER — Encounter: Payer: Self-pay | Admitting: Internal Medicine

## 2018-12-13 ENCOUNTER — Encounter: Payer: Self-pay | Admitting: Internal Medicine

## 2018-12-13 ENCOUNTER — Ambulatory Visit (INDEPENDENT_AMBULATORY_CARE_PROVIDER_SITE_OTHER): Payer: BC Managed Care – PPO | Admitting: Internal Medicine

## 2018-12-13 DIAGNOSIS — R059 Cough, unspecified: Secondary | ICD-10-CM

## 2018-12-13 DIAGNOSIS — R05 Cough: Secondary | ICD-10-CM | POA: Diagnosis not present

## 2018-12-13 DIAGNOSIS — F411 Generalized anxiety disorder: Secondary | ICD-10-CM

## 2018-12-13 MED ORDER — ALPRAZOLAM 0.5 MG PO TABS: 0.5000 mg | ORAL_TABLET | Freq: Two times a day (BID) | ORAL | 2 refills | Status: DC | PRN

## 2018-12-13 MED ORDER — SERTRALINE HCL 100 MG PO TABS: 100.0000 mg | ORAL_TABLET | Freq: Every day | ORAL | 3 refills | Status: DC

## 2018-12-13 MED ORDER — HYDROCODONE-HOMATROPINE 5-1.5 MG/5ML PO SYRP: 5.0000 mL | ORAL_SOLUTION | Freq: Two times a day (BID) | ORAL | 0 refills | Status: DC | PRN

## 2018-12-13 NOTE — Progress Notes (Signed)
Virtual Visit via Video Note  I connected with Tony [name] on 12/13/18 at  1:30 PM EDT by a video enabled telemedicine application and verified that I am speaking with the correct person using two identifiers.   I discussed the limitations of evaluation and management by telemedicine and the availability of in person appointments. The patient expressed understanding and agreed to proceed.  History of Present Illness: This visit is for follow up of his anxiety.  He is experiencing increased anxiety and the other day he did have a panic attack.  He was in the grocery store arm when he saw that the shelves were empty he had a panic attack.  He was fearful that he would not be able to find food and would not be able to eat.  He then subsequently was almost afraid to eat because of running out of food.  This whole situation has caused increased stress.  He is taking his sertraline daily as prescribed.  He uses the Xanax as needed.  He is not like the dissolvable tablets and would prefer the regular Xanax tablets.  He denies any depression.  He has been experiencing difficulty sleeping, chest pain with anxiety only, palpitations with anxiety.  He continues to experience a dry cough from his recent upper respiratory/viral infection.  He did use the cough medication at night and that really helped.  He still has an occasional wheeze, but denies any shortness of breath.  He is using his inhaler, which helps.  He denies any fevers or headaches.  He was hopeful of having another prescription for the cough medication to allow him to get good sleep.  He is the option of working from home, but would need a note to support this.   Observations/Objective: No vitals available  Appears in no acute distress.  He is well-appearing and dressed appropriately.  He does not appear anxious in this moment.  His behavior, thought content and judgment are all normal.  Assessment and Plan:  See Problem List for  Assessment and Plan of chronic medical problems.   Follow Up Instructions:  He is considered to be higher risk of COVID-19 infection secondary to his asthma.  He does has the option to work from home and will provide a note for him to give to his employer.   I discussed the assessment and treatment plan with the patient. The patient was provided an opportunity to ask questions and all were answered. The patient agreed with the plan and demonstrated an understanding of the instructions.   The patient was advised to call back or seek an in-person evaluation if the symptoms worsen or if the condition fails to improve as anticipated.  I provided 15 minutes of non-face-to-face time during this encounter.   Pincus Sanes, MD

## 2018-12-13 NOTE — Assessment & Plan Note (Signed)
Generalized anxiety overall increased and not currently controlled He did have a panic attack the other day with chest pain, palpitations he is also experiencing difficulty sleeping Discussed treatment options and decided to increase sertraline to 100 mg daily We will continue alprazolam twice daily as needed-we will switch to the non-dissolvable tablets, which is what he prefers [place] controlled substance database reviewed-Xanax prescription refilled Follow-up if anxiety does not improve and if he does not feel it is well controlled

## 2018-12-13 NOTE — Assessment & Plan Note (Signed)
Persistent, residual dry cough from recent viral illness Occasional wheeze, no shortness of breath or fevers Using albuterol inhaler and that is helpful-continue I will renew the Hycodan cough syrup for nighttime-he is aware this will be his last prescription He does state that the cough is slowly improving and I expect this to continue to improve and completely resolve-he will call me if it does not

## 2019-01-07 ENCOUNTER — Encounter: Payer: Self-pay | Admitting: Internal Medicine

## 2019-01-07 MED ORDER — AMPHETAMINE-DEXTROAMPHET ER 30 MG PO CP24: 30.0000 mg | ORAL_CAPSULE | ORAL | 0 refills | Status: DC

## 2019-01-07 NOTE — Telephone Encounter (Signed)
Check [place] registry last filled 11/17/2018../lmb  

## 2019-01-12 ENCOUNTER — Encounter: Payer: Self-pay | Admitting: Internal Medicine

## 2019-02-01 IMAGING — US US ABDOMEN COMPLETE
1 series · 13 of 25 positions shown · non-contrast
Comparison: None available.

CLINICAL DATA: Initial evaluation for elevated LFTs, fever.

EXAM:
ABDOMEN ULTRASOUND COMPLETE

[Series 1: us abdomen complete · 0.23mm/px · 13 of 65 slices shown]
[im 1/65]
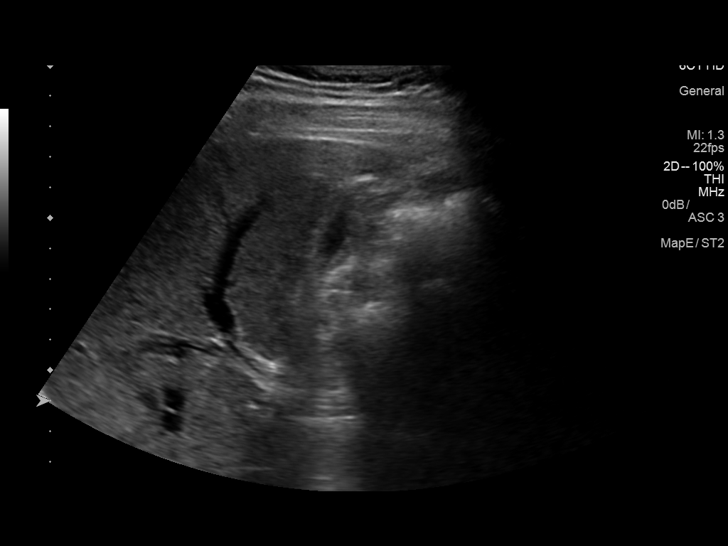
[im 6/65]
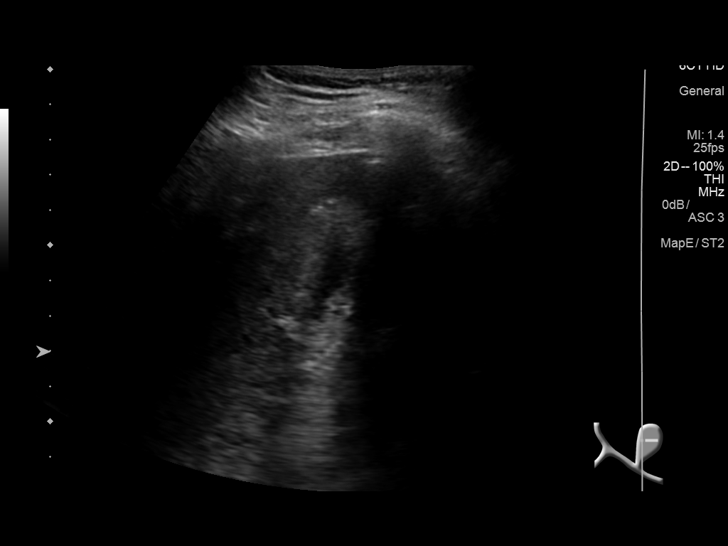
[im 11/65]
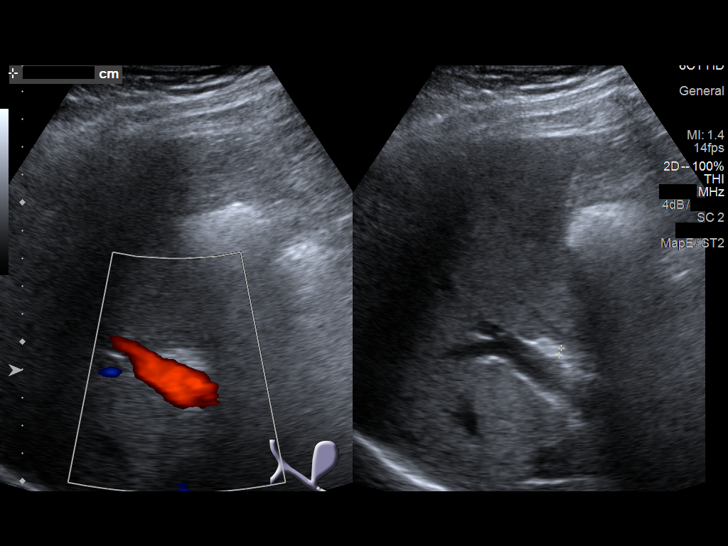
[im 17/65]
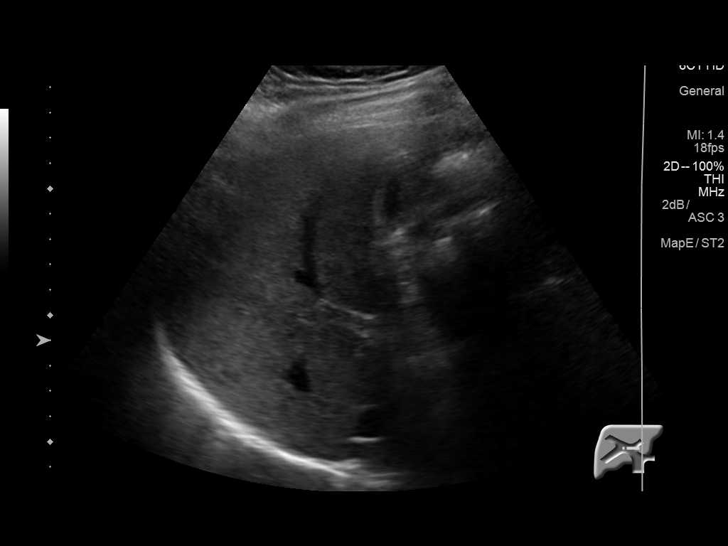
[im 22/65]
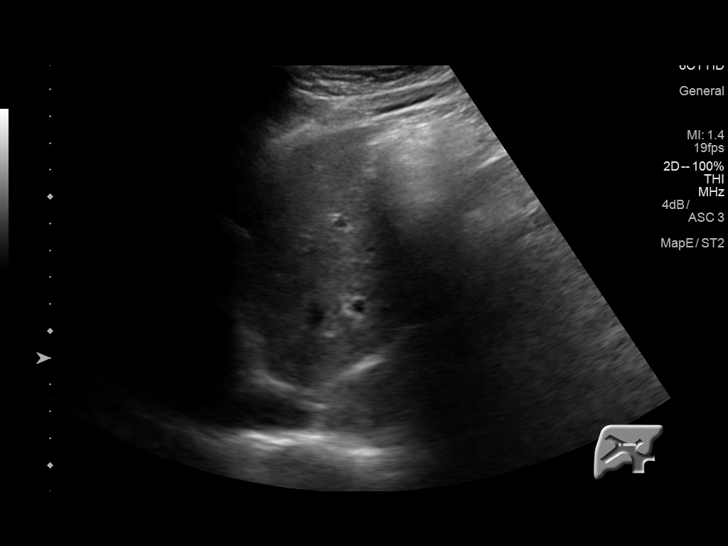
[im 27/65]
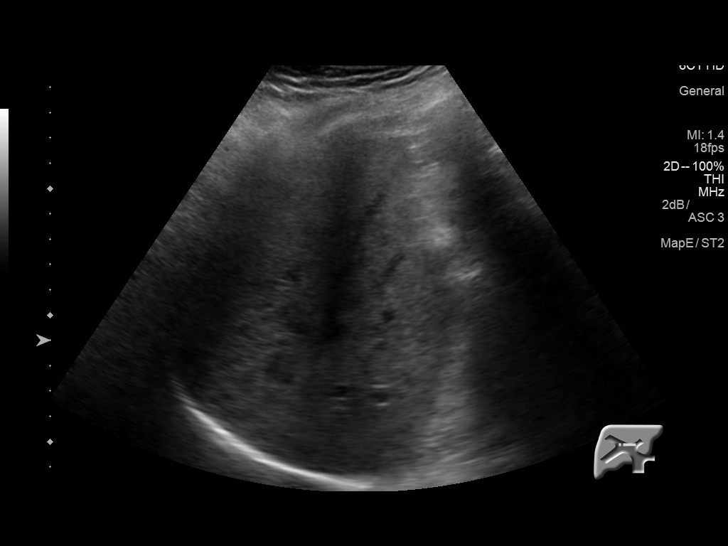
[im 33/65]
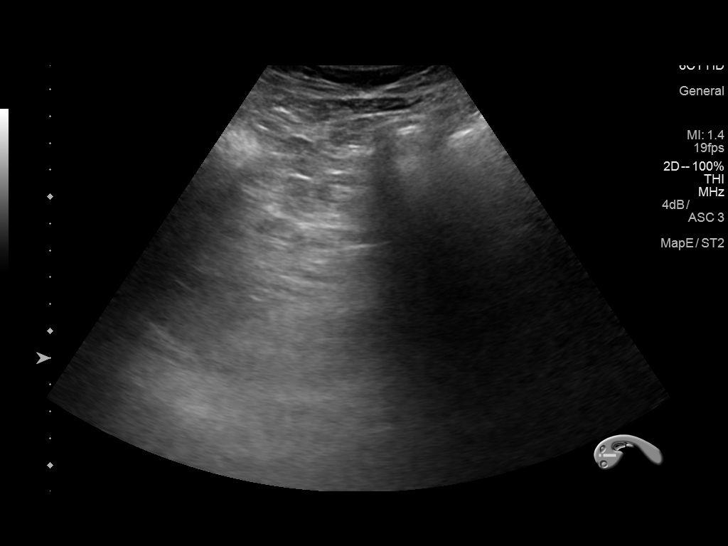
[im 38/65]
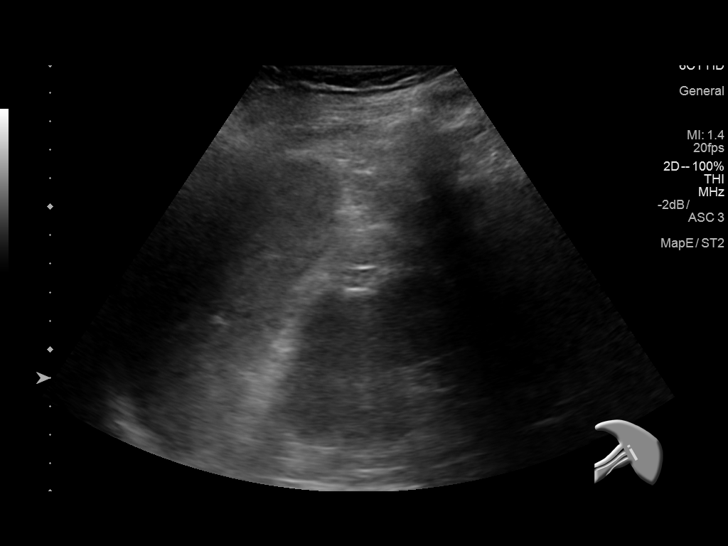
[im 43/65]
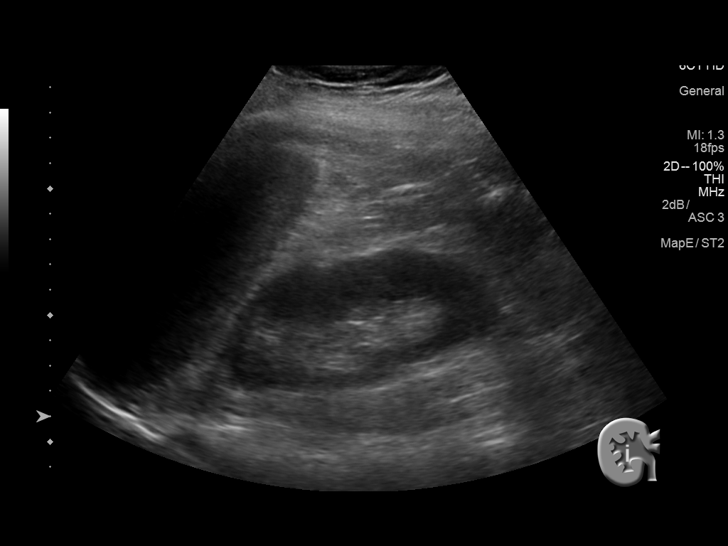
[im 49/65]
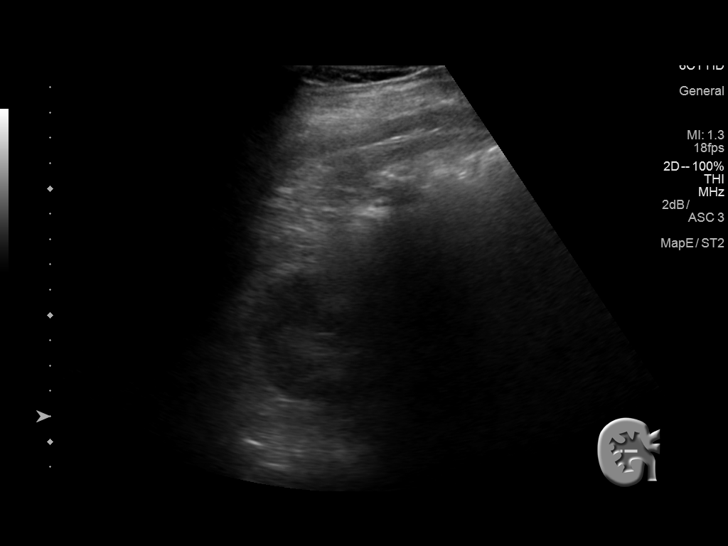
[im 54/65]
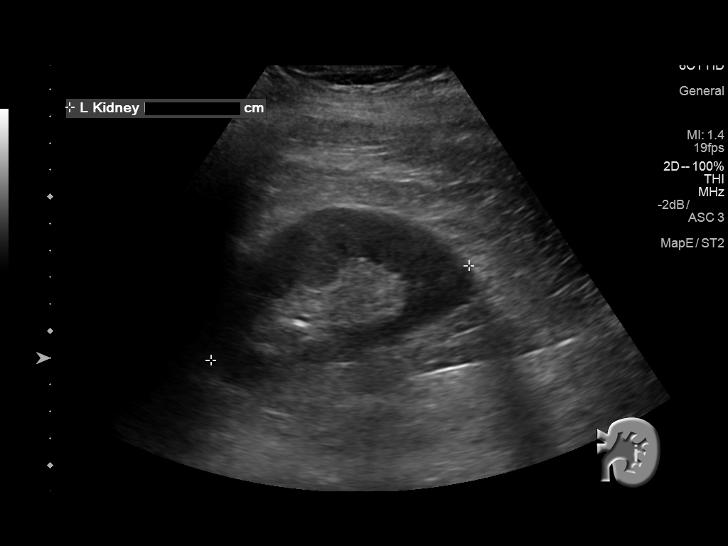
[im 59/65]
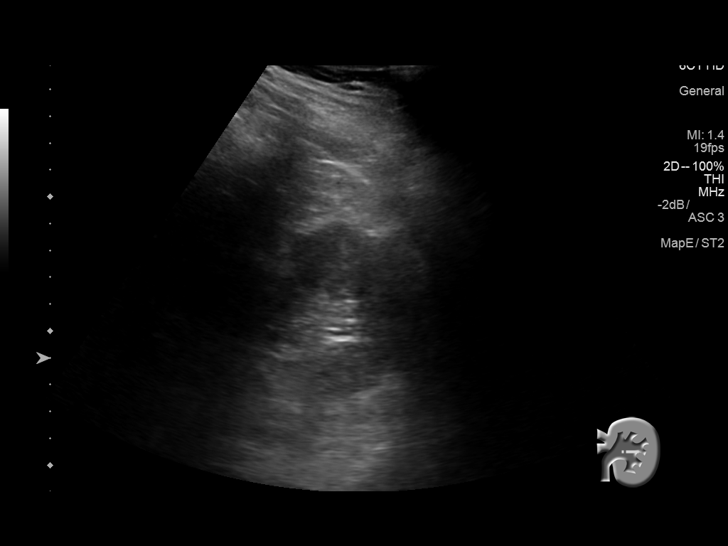
[im 65/65]
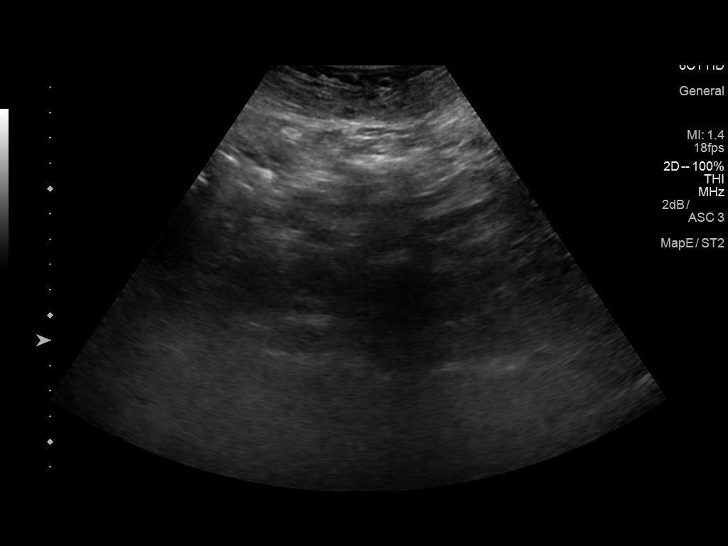

[13 of 25 positions shown; findings below may reference images not displayed]

FINDINGS: Gallbladder: Gallbladder contracted, somewhat limiting evaluation.
No echogenic calculi identified. No internal sludge. Mild diffuse
gallbladder wall thickening up to 3.5 mm favored to be related to
contraction. No free pericholecystic fluid. No sonographic Murphy
sign elicited on exam.

Common bile duct: Diameter: 2.7 mm

Liver: No focal lesion identified. Within normal limits in
parenchymal echogenicity. Portal vein is patent on color Doppler
imaging with normal direction of blood flow towards the liver.

IVC: No abnormality visualized.

Pancreas: Visualized portion unremarkable.

Spleen: Size and appearance within normal limits.

Right Kidney: Length: 10.8 cm. Echogenicity within normal limits. No
mass or hydronephrosis visualized.

Left Kidney: Length: 10.3 cm. Echogenicity within normal limits. No
mass or hydronephrosis visualized.

Abdominal aorta: Not well visualized due to shadowing from overlying
bowel gas.

Other findings: None.
IMPRESSION: 1. Negative abdominal ultrasound.  No acute abnormality identified.
2. Mild diffuse gallbladder wall thickening, felt to be related to
contraction. No cholelithiasis or other sonographic features to
suggest acute cholecystitis. No biliary dilatation.

## 2019-02-02 ENCOUNTER — Other Ambulatory Visit: Payer: Self-pay

## 2019-02-02 ENCOUNTER — Encounter: Payer: Self-pay | Admitting: Internal Medicine

## 2019-02-02 MED ORDER — SERTRALINE HCL 100 MG PO TABS: 100.0000 mg | ORAL_TABLET | Freq: Every day | ORAL | 0 refills | Status: DC

## 2019-02-02 MED ORDER — LOSARTAN POTASSIUM-HCTZ 50-12.5 MG PO TABS: 1.0000 | ORAL_TABLET | Freq: Every day | ORAL | 0 refills | Status: DC

## 2019-02-02 MED ORDER — PANTOPRAZOLE SODIUM 40 MG PO TBEC: 40.0000 mg | DELAYED_RELEASE_TABLET | Freq: Every day | ORAL | 0 refills | Status: DC

## 2019-02-10 MED ORDER — AMPHETAMINE-DEXTROAMPHET ER 30 MG PO CP24: 30.0000 mg | ORAL_CAPSULE | ORAL | 0 refills | Status: DC

## 2019-03-15 ENCOUNTER — Encounter: Payer: Self-pay | Admitting: Internal Medicine

## 2019-03-16 MED ORDER — ALPRAZOLAM 0.5 MG PO TABS: 0.5000 mg | ORAL_TABLET | Freq: Two times a day (BID) | ORAL | 2 refills | Status: DC | PRN

## 2019-03-16 MED ORDER — AMPHETAMINE-DEXTROAMPHET ER 30 MG PO CP24: 30.0000 mg | ORAL_CAPSULE | ORAL | 0 refills | Status: DC

## 2019-04-18 ENCOUNTER — Ambulatory Visit: Payer: BC Managed Care – PPO | Admitting: Internal Medicine

## 2019-04-20 ENCOUNTER — Encounter: Payer: Self-pay | Admitting: Internal Medicine

## 2019-04-21 MED ORDER — AMPHETAMINE-DEXTROAMPHET ER 30 MG PO CP24: 30.0000 mg | ORAL_CAPSULE | ORAL | 0 refills | Status: DC

## 2019-04-21 NOTE — Telephone Encounter (Signed)
Check [place] registry last filled 11/17/2018.Marland KitchenJohny Chess

## 2019-05-08 NOTE — Patient Instructions (Addendum)
  Tests ordered today. Your results will be released to [place] (or called to you) after review.  If any changes need to be made, you will be notified at that same time.   Medications reviewed and updated.  Changes include :   Increase sertraline to 150 mg daily.  Your prescription(s) have been submitted to your pharmacy. Please take as directed and contact our office if you believe you are having problem(s) with the medication(s).    Please followup in 6 months

## 2019-05-08 NOTE — Progress Notes (Signed)
Subjective:    Patient ID: Tony [name], male    DOB: [birth year], 43 y.o.   MRN: [id number]  HPI The patient is here for an acute visit.  He is active, but not exercising regularly.   He has lost a little weight.  He has been back in [place] for one week.  He does not do well with mask-it makes him cough and he has been getting sores on the edge of his mouth and his nose.  It increases anxiety.  He just struggles to wear it.  He is very anxious about returning to work and feels that his school could do more to keep him safe.  He wonders if his elevated risk of giving COVID would allow him to work from home.   Anxiety: he is taking the sertraline daily.  He takes xanax only as needed and he tries not to take it too much.  He does not feel that his anxiety is controlled.  ADD:  He is taking his medication as prescribed.  He feels the medication is effective.  He denies side effects, including palpitations, headaches, lightheadedness, decreased appetite and weight loss.   Depression: He is taking his medication daily as prescribed. He denies any side effects from the medication. He feels his depression is well controlled and he is happy with his current dose of medication.   Prediabetes:  He is compliant with a low sugar/carbohydrate diet.  He is not exercising regularly.  Hypertension: He is taking his medication daily. He is compliant with a low sodium diet.  He denies chest pain, palpitations, edema, shortness of breath and regular headaches.  GERD:  He is taking his medication daily as prescribed.  He denies any GERD symptoms and feels his GERD is well controlled.    Migraine headaches: His last migraine was a couple of weeks ago.  Overall he feels his migraines are controlled.  And he is happy with his medications..    Medications and allergies reviewed with patient and updated if appropriate.  Patient Active Problem List   Diagnosis Date Noted   Cough 12/02/2018   Bilateral  shoulder bursitis 10/20/2018   Acute bursitis of right shoulder 06/18/2018   Prediabetes 06/03/2018   Hepatitis, acute 07/24/2017   Esophageal reflux 06/17/2016   Nonallopathic lesion of cervical region 04/02/2016   Nonallopathic lesion of thoracic region 04/02/2016   Nonallopathic lesion-rib cage 04/02/2016   Pain in right acromioclavicular joint 02/21/2016   Chronic upper back pain 12/18/2015   Depression 05/31/2015   Overweight    Asthma, mild intermittent 03/31/2013   Migraine    Obstructive sleep apnea    Generalized anxiety disorder    Seasonal and perennial allergic rhinitis    Essential hypertension    ADD (attention deficit disorder)     Current Outpatient Medications on File Prior to Visit  Medication Sig Dispense Refill   acyclovir ointment (ZOVIRAX) 5 % APPLY TOPICALLY EVERY 3 HOURS 15 g 0   albuterol (PROVENTIL) (2.5 MG/3ML) 0.083% nebulizer solution Take 3 mLs (2.5 mg total) by nebulization every 6 (six) hours as needed for wheezing or shortness of breath. 75 mL 12   ALPRAZolam (XANAX) 0.5 MG tablet Take 1 tablet (0.5 mg total) by mouth 2 (two) times daily as needed for anxiety. 60 tablet 2   amphetamine-dextroamphetamine (ADDERALL XR) 30 MG 24 hr capsule Take 1 capsule (30 mg total) by mouth every morning. 30 capsule 0   Azelastine-Fluticasone 137-50 MCG/ACT SUSP Place 1  spray into the nose 2 (two) times daily. 23 g 5   butalbital-acetaminophen-caffeine (FIORICET, ESGIC) 50-325-40 MG tablet TAKE 1-2 TABLETS BY MOUTH EVERY 6 HOURS AS NEEDED FOR MIGRAINE 20 tablet 0   chlorpheniramine (CHLOR-TRIMETON) 4 MG tablet Take 1 tablet (4 mg total) by mouth every 6 (six) hours as needed for allergies. 120 tablet 3   fluticasone (FLONASE) 50 MCG/ACT nasal spray USE TWO SPRAY IN NOSE ONCE DAILY 16 g 5   Ibuprofen-Famotidine 800-26.6 MG TABS Take 1 tablet 3 times a day. 270 tablet 1   Insulin Pen Needle (BD ULTRA-FINE MICRO PEN NEEDLE) 32G X 6 MM MISC Use  with Saxenda 100 each 1   levalbuterol (XOPENEX HFA) 45 MCG/ACT inhaler Inhale 2 puffs into the lungs every 6 (six) hours as needed for wheezing. 3 Inhaler 0   losartan-hydrochlorothiazide (HYZAAR) 50-12.5 MG tablet Take 1 tablet by mouth daily. 90 tablet 0   pantoprazole (PROTONIX) 40 MG tablet Take 1 tablet (40 mg total) by mouth daily. 90 tablet 0   sertraline (ZOLOFT) 100 MG tablet Take 1 tablet (100 mg total) by mouth daily. 90 tablet 0   SUMAtriptan (IMITREX) 6 MG/0.5ML SOLN injection Inject 0.5 mLs (6 mg total) into the skin every 2 (two) hours as needed for migraine or headache. May repeat in 2 hrs if headache persists 0.5 mL 5   tiZANidine (ZANAFLEX) 4 MG tablet Take 1 tablet (4 mg total) by mouth 3 (three) times daily. 90 tablet 0   No current facility-administered medications on file prior to visit.     Past Medical History:  Diagnosis Date   Allergic rhinitis    Anxiety    overlap with depression and ADD   Asthma    Hepatitis A    Hypertension    Migraine    Migraines    OSA on CPAP    Sleep study 12/2011: severe, CPAP 15mmHg    Past Surgical History:  Procedure Laterality Date   LASIK  2005   WISDOM TOOTH EXTRACTION      Social History   Socioeconomic History   Marital status: Single    Spouse name: Not on file   Number of children: Not on file   Years of education: Not on file   Highest education level: Not on file  Occupational History   Occupation: RESIDENT HALL DIRECTOR    Employer: [employer]  Social Needs   Financial resource strain: Not on file   Food insecurity    Worry: Not on file    Inability: Not on file   Transportation needs    Medical: Not on file    Non-medical: Not on file  Tobacco Use   Smoking status: Former Smoker    Packs/day: 0.24    Years: 3.00    Pack years: 0.72    Types: Cigarettes    Quit date: 09/22/2008    Years since quitting: 10.6   Smokeless tobacco: Never Used  Substance and Sexual  Activity   Alcohol use: Yes    Alcohol/week: 0.0 standard drinks    Comment: OCCASSIONAL   Drug use: No   Sexual activity: Not on file  Lifestyle   Physical activity    Days per week: Not on file    Minutes per session: Not on file   Stress: Not on file  Relationships   Social connections    Talks on phone: Not on file    Gets together: Not on file  Attends religious service: Not on file    Active member of club or organization: Not on file    Attends meetings of clubs or organizations: Not on file    Relationship status: Not on file  Other Topics Concern   Not on file  Social History Narrative   Single, 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  Respiratory: Positive for cough (allergy related) and wheezing (allergy related). Negative for shortness of breath.   Cardiovascular: Negative for chest pain, palpitations and leg swelling.  Neurological: Negative for light-headedness and headaches.  Psychiatric/Behavioral: Positive for dysphoric mood. The patient is nervous/anxious.        Objective:   Vitals:   05/09/19 1118  BP: 138/84  Pulse: 83  Resp: 16  Temp: 98.6 F (37 C)  SpO2: 99%   BP Readings from Last 3 Encounters:  05/09/19 138/84  12/02/18 138/88  11/23/18 128/72   Wt Readings from Last 3 Encounters:  05/09/19 186 lb 12.8 oz (84.7 kg)  12/02/18 187 lb (84.8 kg)  11/23/18 190 lb 12.8 oz (86.5 kg)   Body mass index is 29.26 kg/m.   Physical Exam         Assessment & Plan:    See Problem List for Assessment and Plan of chronic medical problems.

## 2019-05-09 ENCOUNTER — Encounter: Payer: Self-pay | Admitting: Internal Medicine

## 2019-05-09 ENCOUNTER — Other Ambulatory Visit (INDEPENDENT_AMBULATORY_CARE_PROVIDER_SITE_OTHER): Payer: BC Managed Care – PPO

## 2019-05-09 ENCOUNTER — Other Ambulatory Visit: Payer: Self-pay

## 2019-05-09 ENCOUNTER — Ambulatory Visit (INDEPENDENT_AMBULATORY_CARE_PROVIDER_SITE_OTHER): Payer: BC Managed Care – PPO | Admitting: Internal Medicine

## 2019-05-09 VITALS — BP 138/84 | HR 83 | Temp 98.6°F | Resp 16 | Ht 67.0 in | Wt 186.8 lb

## 2019-05-09 DIAGNOSIS — K219 Gastro-esophageal reflux disease without esophagitis: Secondary | ICD-10-CM | POA: Diagnosis not present

## 2019-05-09 DIAGNOSIS — R7303 Prediabetes: Secondary | ICD-10-CM | POA: Diagnosis not present

## 2019-05-09 DIAGNOSIS — F988 Other specified behavioral and emotional disorders with onset usually occurring in childhood and adolescence: Secondary | ICD-10-CM

## 2019-05-09 DIAGNOSIS — F411 Generalized anxiety disorder: Secondary | ICD-10-CM

## 2019-05-09 DIAGNOSIS — I10 Essential (primary) hypertension: Secondary | ICD-10-CM

## 2019-05-09 DIAGNOSIS — F3289 Other specified depressive episodes: Secondary | ICD-10-CM | POA: Diagnosis not present

## 2019-05-09 DIAGNOSIS — J4520 Mild intermittent asthma, uncomplicated: Secondary | ICD-10-CM

## 2019-05-09 DIAGNOSIS — G43909 Migraine, unspecified, not intractable, without status migrainosus: Secondary | ICD-10-CM

## 2019-05-09 LAB — LIPID PANEL
Cholesterol: 184 mg/dL (ref 0–200)
HDL: 63.5 mg/dL (ref >39.00)
LDL Cholesterol: 101 mg/dL — ABNORMAL HIGH (ref 0–99)
NonHDL: 120.76
Total CHOL/HDL Ratio: 3
Triglycerides: 100 mg/dL (ref 0.0–149.0)
VLDL: 20 mg/dL (ref 0.0–40.0)

## 2019-05-09 LAB — COMPREHENSIVE METABOLIC PANEL
ALT: 30 U/L (ref 0–53)
AST: 19 U/L (ref 0–37)
Albumin: 4.7 g/dL (ref 3.5–5.2)
Alkaline Phosphatase: 98 U/L (ref 39–117)
BUN: 17 mg/dL (ref 6–23)
CO2: 30 mEq/L (ref 19–32)
Calcium: 9.6 mg/dL (ref 8.4–10.5)
Chloride: 99 mEq/L (ref 96–112)
Creatinine, Ser: 1.14 mg/dL (ref 0.40–1.50)
GFR: 69.81 mL/min (ref >60.00)
Glucose, Bld: 102 mg/dL — ABNORMAL HIGH (ref 70–99)
Potassium: 3.9 mEq/L (ref 3.5–5.1)
Sodium: 136 mEq/L (ref 135–145)
Total Bilirubin: 0.3 mg/dL (ref 0.2–1.2)
Total Protein: 7.3 g/dL (ref 6.0–8.3)

## 2019-05-09 LAB — CBC WITH DIFFERENTIAL/PLATELET
Basophils Absolute: 0 10*3/uL (ref 0.0–0.1)
Basophils Relative: 0.9 % (ref 0.0–3.0)
Eosinophils Absolute: 0.3 10*3/uL (ref 0.0–0.7)
Eosinophils Relative: 5.1 % — ABNORMAL HIGH (ref 0.0–5.0)
HCT: 43.5 % (ref 39.0–52.0)
Hemoglobin: 14.6 g/dL (ref 13.0–17.0)
Lymphocytes Relative: 21.9 % (ref 12.0–46.0)
Lymphs Abs: 1.2 10*3/uL (ref 0.7–4.0)
MCHC: 33.7 g/dL (ref 30.0–36.0)
MCV: 84.5 fl (ref 78.0–100.0)
Monocytes Absolute: 0.5 10*3/uL (ref 0.1–1.0)
Monocytes Relative: 9.5 % (ref 3.0–12.0)
Neutro Abs: 3.4 10*3/uL (ref 1.4–7.7)
Neutrophils Relative %: 62.6 % (ref 43.0–77.0)
Platelets: 268 10*3/uL (ref 150.0–400.0)
RBC: 5.15 Mil/uL (ref 4.22–5.81)
RDW: 14.3 % (ref 11.5–15.5)
WBC: 5.4 10*3/uL (ref 4.0–10.5)

## 2019-05-09 LAB — HEMOGLOBIN A1C: Hgb A1c MFr Bld: 6.1 % (ref 4.6–6.5)

## 2019-05-09 LAB — TSH: TSH: 2.5 u[IU]/mL (ref 0.35–4.50)

## 2019-05-09 MED ORDER — PANTOPRAZOLE SODIUM 40 MG PO TBEC: 40.0000 mg | DELAYED_RELEASE_TABLET | Freq: Every day | ORAL | 1 refills | Status: DC

## 2019-05-09 MED ORDER — SERTRALINE HCL 100 MG PO TABS: 150.0000 mg | ORAL_TABLET | Freq: Every day | ORAL | 1 refills | Status: DC

## 2019-05-09 MED ORDER — LOSARTAN POTASSIUM-HCTZ 50-12.5 MG PO TABS: 1.0000 | ORAL_TABLET | Freq: Every day | ORAL | 1 refills | Status: DC

## 2019-05-09 NOTE — Assessment & Plan Note (Signed)
Not controlled He is very anxious regarding COVID and his risk of getting COVID, especially working at the college He feels he is able to do his job remotely at least 90% of the time and would like a letter stating that he is at an increased risk-he will bring this to HR We will increase sertraline to 150 mg daily and hopefully this will help with some of his anxiety Can continue Xanax as needed, but will try to keep to a minimum

## 2019-05-09 NOTE — Assessment & Plan Note (Signed)
GERD controlled Continue daily medication  

## 2019-05-09 NOTE — Assessment & Plan Note (Signed)
Blood pressure borderline high Continue current medications at current doses Work on starting regular exercise CMP

## 2019-05-09 NOTE — Assessment & Plan Note (Signed)
Controlled, stable Continue current dose of medication  

## 2019-05-09 NOTE — Assessment & Plan Note (Signed)
Depression is controlled We will continue sertraline-we will be increasing dose for anxiety, but not for depression

## 2019-05-09 NOTE — Assessment & Plan Note (Signed)
Check a1c Low sugar / carb diet Stressed regular exercise   

## 2019-05-09 NOTE — Assessment & Plan Note (Signed)
Continue allergy medication and rescue inhaler as needed Overall controlled

## 2019-05-09 NOTE — Assessment & Plan Note (Signed)
Overall controlled Continue current medications

## 2019-05-11 ENCOUNTER — Encounter: Payer: Self-pay | Admitting: Internal Medicine

## 2019-05-26 ENCOUNTER — Encounter: Payer: Self-pay | Admitting: Internal Medicine

## 2019-05-26 MED ORDER — AMPHETAMINE-DEXTROAMPHET ER 30 MG PO CP24: 30.0000 mg | ORAL_CAPSULE | ORAL | 0 refills | Status: DC

## 2019-05-27 ENCOUNTER — Other Ambulatory Visit: Payer: Self-pay

## 2019-05-27 ENCOUNTER — Encounter: Payer: Self-pay | Admitting: Family Medicine

## 2019-05-27 ENCOUNTER — Ambulatory Visit (INDEPENDENT_AMBULATORY_CARE_PROVIDER_SITE_OTHER): Payer: BC Managed Care – PPO | Admitting: Family Medicine

## 2019-05-27 ENCOUNTER — Ambulatory Visit: Payer: Self-pay

## 2019-05-27 VITALS — BP 110/78 | HR 68 | Ht 67.0 in

## 2019-05-27 DIAGNOSIS — M7552 Bursitis of left shoulder: Secondary | ICD-10-CM

## 2019-05-27 DIAGNOSIS — G8929 Other chronic pain: Secondary | ICD-10-CM

## 2019-05-27 DIAGNOSIS — Z23 Encounter for immunization: Secondary | ICD-10-CM

## 2019-05-27 DIAGNOSIS — M25511 Pain in right shoulder: Secondary | ICD-10-CM

## 2019-05-27 DIAGNOSIS — M999 Biomechanical lesion, unspecified: Secondary | ICD-10-CM | POA: Diagnosis not present

## 2019-05-27 DIAGNOSIS — M7551 Bursitis of right shoulder: Secondary | ICD-10-CM

## 2019-05-27 MED ORDER — HYDROXYZINE HCL 10 MG PO TABS: 10.0000 mg | ORAL_TABLET | Freq: Three times a day (TID) | ORAL | 0 refills | Status: DC | PRN

## 2019-05-27 MED ORDER — TIZANIDINE HCL 4 MG PO TABS: 4.0000 mg | ORAL_TABLET | Freq: Every day | ORAL | 0 refills | Status: DC

## 2019-05-27 NOTE — Progress Notes (Signed)
[facility] [place], [place] [address] Phone: [phone] Subjective:   Tony [name], am serving as a scribe for Dr. Hulan Saas.    CC: Bilateral shoulder and neck pain RDE:[id number]      Update 05/27/2019 Tony [name] is a 44 y.o. male coming in with complaint of bilateral shoulder pain. Injections given in January 2020. Patient also receives OMT. Patient states the pain is become significant at the moment.  Patient states instability seems to be secondary to stress.  Doing a lot of activities repetitive activities that likely is contributing to some of the discomfort and pain.  Sometimes can wake him up at night.     Past Medical History:  Diagnosis Date   Allergic rhinitis    Anxiety    overlap with depression and ADD   Asthma    Hepatitis A    Hypertension    Migraine    Migraines    OSA on CPAP    Sleep study 12/2011: severe, CPAP 44mmHg   Past Surgical History:  Procedure Laterality Date   LASIK  2005   WISDOM TOOTH EXTRACTION     Social History   Socioeconomic History   Marital status: Single    Spouse name: Not on file   Number of children: Not on file   Years of education: Not on file   Highest education level: Not on file  Occupational History   Occupation: RESIDENT HALL DIRECTOR    Employer: [employer]  Social Needs   Financial resource strain: Not on file   Food insecurity    Worry: Not on file    Inability: Not on file   Transportation needs    Medical: Not on file    Non-medical: Not on file  Tobacco Use   Smoking status: Former Smoker    Packs/day: 0.24    Years: 3.00    Pack years: 0.72    Types: Cigarettes    Quit date: 09/22/2008    Years since quitting: 10.6   Smokeless tobacco: Never Used  Substance and Sexual Activity   Alcohol use: Yes    Alcohol/week: 0.0 standard drinks    Comment: OCCASSIONAL   Drug use: [name]   Sexual activity: Not on file    Lifestyle   Physical activity    Days per week: Not on file    Minutes per session: Not on file   Stress: Not on file  Relationships   Social connections    Talks on phone: Not on file    Gets together: Not on file    Attends religious service: Not on file    Active member of club or organization: Not on file    Attends meetings of clubs or organizations: Not on file    Relationship status: Not on file  Other Topics Concern   Not on file  Social History Narrative   Single, employed with residents life as Ambulance person at [employer]   Allergies  Allergen Reactions   Erythromycin     Pt states med make him sick   Family History  Problem Relation Age of Onset   Hypertension Mother    Arthritis Father    Hypertension Father    Lung cancer Maternal Grandfather    Diabetes Other    Emphysema Paternal Grandfather    Asthma Paternal Grandfather      Current Outpatient Medications (Cardiovascular):    losartan-hydrochlorothiazide (HYZAAR) 50-12.5 MG  tablet, Take 1 tablet by mouth daily.  Current Outpatient Medications (Respiratory):    albuterol (PROVENTIL) (2.5 MG/3ML) 0.083% nebulizer solution, Take 3 mLs (2.5 mg total) by nebulization every 6 (six) hours as needed for wheezing or shortness of breath.   Azelastine-Fluticasone 137-50 MCG/ACT SUSP, Place 1 spray into the nose 2 (two) times daily.   chlorpheniramine (CHLOR-TRIMETON) 4 MG tablet, Take 1 tablet (4 mg total) by mouth every 6 (six) hours as needed for allergies.   fluticasone (FLONASE) 50 MCG/ACT nasal spray, USE TWO SPRAY IN NOSE ONCE DAILY   levalbuterol (XOPENEX HFA) 45 MCG/ACT inhaler, Inhale 2 puffs into the lungs every 6 (six) hours as needed for wheezing.  Current Outpatient Medications (Analgesics):    butalbital-acetaminophen-caffeine (FIORICET, ESGIC) 50-325-40 MG tablet, TAKE 1-2 TABLETS BY MOUTH EVERY 6 HOURS AS NEEDED FOR MIGRAINE   Ibuprofen-Famotidine 800-26.6 MG TABS, Take  1 tablet 3 times a day.   SUMAtriptan (IMITREX) 6 MG/0.5ML SOLN injection, Inject 0.5 mLs (6 mg total) into the skin every 2 (two) hours as needed for migraine or headache. May repeat in 2 hrs if headache persists   Current Outpatient Medications (Other):    acyclovir ointment (ZOVIRAX) 5 %, APPLY TOPICALLY EVERY 3 HOURS   ALPRAZolam (XANAX) 0.5 MG tablet, Take 1 tablet (0.5 mg total) by mouth 2 (two) times daily as needed for anxiety.   amphetamine-dextroamphetamine (ADDERALL XR) 30 MG 24 hr capsule, Take 1 capsule (30 mg total) by mouth every morning.   hydrOXYzine (ATARAX/VISTARIL) 10 MG tablet, Take 1 tablet (10 mg total) by mouth 3 (three) times daily as needed.   Insulin Pen Needle (BD ULTRA-FINE MICRO PEN NEEDLE) 32G X 6 MM MISC, Use with Saxenda   pantoprazole (PROTONIX) 40 MG tablet, Take 1 tablet (40 mg total) by mouth daily.   sertraline (ZOLOFT) 100 MG tablet, Take 1.5 tablets (150 mg total) by mouth daily.   tiZANidine (ZANAFLEX) 4 MG tablet, Take 1 tablet (4 mg total) by mouth 3 (three) times daily.   tiZANidine (ZANAFLEX) 4 MG tablet, Take 1 tablet (4 mg total) by mouth at bedtime.    Past medical history, social, surgical and family history all reviewed in electronic medical record.  [name] pertanent information unless stated regarding to the chief complaint.   Review of Systems:  [name] headache, visual changes, nausea, vomiting, diarrhea, constipation, dizziness, abdominal pain, skin rash, fevers, chills, night sweats, weight loss, swollen lymph nodes, body aches, joint swelling,  chest pain, shortness of breath, mood changes.  Positive muscle aches  Objective  Blood pressure 110/78, pulse 68, height 5\' 7"  (1.702 m), SpO2 98 %.   General: [name] apparent distress alert and oriented x3 mood and affect normal, dressed appropriately.  HEENT: Pupils equal, extraocular movements intact  Respiratory: Patient's speak in full sentences and does not appear short of breath    Cardiovascular: [name] lower extremity edema, non tender, [name] erythema  Skin: Warm dry intact with [name] signs of infection or rash on extremities or on axial skeleton.  Abdomen: Soft nontender  Neuro: Cranial nerves II through XII are intact, neurovascularly intact in all extremities with 2+ DTRs and 2+ pulses.  Lymph: [name] lymphadenopathy of posterior or anterior cervical chain or axillae bilaterally.  Gait antalgic MSK:  tender with  Limited range of motion and good stability and symmetric strength and tone of shoulders, elbows, wrist, hip, knee and ankles bilaterally.    Shoulders bilaterally does have some positive impingement bilaterally.  Full range of motion.  4+ out of 5 strength the rotator cuff bilaterally.  Neurovascularly intact distally.  Back exam has some mild loss of lordosis, tightness noted in the paraspinal musculature of the lumbar region bilaterally.  Mild positive Faber test bilaterally negative straight leg test.  Deep tendon reflexes intact.  Tightness in the parascapular region of the thoracic spine as well bilaterally  Procedure: Real-time Ultrasound Guided Injection of right glenohumeral joint Device: GE Logiq Q7  Ultrasound guided injection is preferred based studies that show increased duration, increased effect, greater accuracy, decreased procedural pain, increased response rate with ultrasound guided versus blind injection.  Verbal informed consent obtained.  Time-out conducted.  Noted [name] overlying erythema, induration, or other signs of local infection.  Skin prepped in a sterile fashion.  Local anesthesia: Topical Ethyl chloride.  With sterile technique and under real time ultrasound guidance:  Joint visualized.  23g 1  inch needle inserted posterior approach. Pictures taken for needle placement. Patient did have injection of 2 cc of 1% lidocaine, 2 cc of 0.5% Marcaine, and 1.0 cc of Kenalog 40 mg/dL. Completed without difficulty  Pain immediately resolved suggesting  accurate placement of the medication.  Advised to call if fevers/chills, erythema, induration, drainage, or persistent bleeding.  Images permanently stored and available for review in the ultrasound unit.  Impression: Technically successful ultrasound guided injection.  Procedure: Real-time Ultrasound Guided Injection of left glenohumeral joint Device: GE Logiq E  Ultrasound guided injection is preferred based studies that show increased duration, increased effect, greater accuracy, decreased procedural pain, increased response rate with ultrasound guided versus blind injection.  Verbal informed consent obtained.  Time-out conducted.  Noted [name] overlying erythema, induration, or other signs of local infection.  Skin prepped in a sterile fashion.  Local anesthesia: Topical Ethyl chloride.  With sterile technique and under real time ultrasound guidance:  Joint visualized.  21g 2 inch needle inserted posterior approach. Pictures taken for needle placement. Patient did have injection of 2 cc of 0.5% Marcaine, and 1cc of Kenalog 40 mg/dL. Completed without difficulty  Pain immediately resolved suggesting accurate placement of the medication.  Advised to call if fevers/chills, erythema, induration, drainage, or persistent bleeding.  Images permanently stored and available for review in the ultrasound unit.  Impression: Technically successful ultrasound guided injection.  Osteopathic findings  C5 flexed rotated and side bent left T4 extended rotated and side bent right with inhaled rib    Impression and Recommendations:     This case required medical decision making of moderate complexity. The above documentation has been reviewed and is accurate and complete Judi Saa, DO       Note: This dictation was prepared with Dragon dictation along with smaller phrase technology. Any transcriptional errors that result from this process are unintentional.

## 2019-05-27 NOTE — Patient Instructions (Addendum)
Injected both shoulders today See me again in 2 months

## 2019-05-28 ENCOUNTER — Encounter: Payer: Self-pay | Admitting: Family Medicine

## 2019-05-28 NOTE — Assessment & Plan Note (Signed)
Decision today to treat with OMT was based on Physical Exam  After verbal consent patient was treated with HVLA, ME, FPR techniques in cervical, thoracic, rib areas  Patient tolerated the procedure well with improvement in symptoms  Patient given exercises, stretches and lifestyle modifications  See medications in patient instructions if given  Patient will follow up in 4 weeks 

## 2019-05-28 NOTE — Assessment & Plan Note (Signed)
Bilateral injections given, discussed icing regimen and home exercises, discussed which activities to doing which wants to avoid.  Increase activity as tolerated.  Follow-up again in 4 to 6 weeks

## 2019-06-21 NOTE — Progress Notes (Signed)
Virtual Visit via Video Note  I connected with [place] on 06/21/19 at  2:15 PM EDT by a video enabled telemedicine application and verified that I am speaking with the correct person using two identifiers.   I discussed the limitations of evaluation and management by telemedicine and the availability of in person appointments. The patient expressed understanding and agreed to proceed.  The patient is currently at home and I am in the office.    No referring provider.    History of Present Illness: This is an acute visit for cough and tightness in chest.    His symptoms started one week ago  He is experiencing fatigue, ear pain, congestion, sinus pain, sore throat, productive cough of discolored mucus at times, shortness of breath related to the coughing primarily, occasional wheezing, nausea, body aches and headaches.  He denies fever, diarrhea and vomiting.  He has tried taking Over-the-counter cold medications.  He has not used his inhaler or nebulizer.  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  He can get tested on campus.  Review of Systems  Constitutional: Positive for malaise/fatigue. Negative for fever.  HENT: Positive for congestion, ear pain, sinus pain and sore throat.        Ear popping  Respiratory: Positive for cough (discolored mucus), shortness of breath (mild) and wheezing.   Gastrointestinal: Positive for nausea. Negative for diarrhea and vomiting.  Musculoskeletal: Positive for myalgias.  Neurological: Positive for headaches.     Social History   Socioeconomic History  . Marital status: Single    Spouse name: Not on file  . Number of children: Not on file  . Years of education: Not on file  . Highest education level: Not on file  Occupational History  . Occupation: RESIDENT Financial trader: [employer]  Social Needs  . Financial resource strain: Not on file  . Food insecurity    Worry: Not on file   Inability: Not on file  . Transportation needs    Medical: Not on file    Non-medical: Not on file  Tobacco Use  . Smoking status: Former Smoker    Packs/day: 0.24    Years: 3.00    Pack years: 0.72    Types: Cigarettes    Quit date: 09/22/2008    Years since quitting: 10.7  . Smokeless tobacco: Never Used  Substance and Sexual Activity  . Alcohol use: Yes    Alcohol/week: 0.0 standard drinks    Comment: OCCASSIONAL  . Drug use: No  . Sexual activity: Not on file  Lifestyle  . Physical activity    Days per week: Not on file    Minutes per session: Not on file  . Stress: Not on file  Relationships  . Social Herbalist on phone: Not on file    Gets together: Not on file    Attends religious service: Not on file    Active member of club or organization: Not on file    Attends meetings of clubs or organizations: Not on file    Relationship status: Not on file  Other Topics Concern  . Not on file  Social History Narrative   Single, employed with residents life as Ambulance person at [employer]     Observations/Objective: Appears well in NAD Breathing normally  Assessment and Plan:  See Problem List for Assessment and Plan of chronic medical problems.  Follow Up Instructions:    I discussed the assessment and treatment plan with the patient. The patient was provided an opportunity to ask questions and all were answered. The patient agreed with the plan and demonstrated an understanding of the instructions.   The patient was advised to call back or seek an in-person evaluation if the symptoms worsen or if the condition fails to improve as anticipated.    Binnie Rail, MD

## 2019-06-22 ENCOUNTER — Ambulatory Visit (INDEPENDENT_AMBULATORY_CARE_PROVIDER_SITE_OTHER): Payer: BC Managed Care – PPO | Admitting: Internal Medicine

## 2019-06-22 ENCOUNTER — Encounter: Payer: Self-pay | Admitting: Internal Medicine

## 2019-06-22 DIAGNOSIS — Z20828 Contact with and (suspected) exposure to other viral communicable diseases: Secondary | ICD-10-CM | POA: Diagnosis not present

## 2019-06-22 DIAGNOSIS — J069 Acute upper respiratory infection, unspecified: Secondary | ICD-10-CM | POA: Diagnosis not present

## 2019-06-22 MED ORDER — CEFDINIR 300 MG PO CAPS: 300.0000 mg | ORAL_CAPSULE | Freq: Two times a day (BID) | ORAL | 0 refills | Status: DC

## 2019-06-22 NOTE — Assessment & Plan Note (Signed)
URI but does not sound like COVID or the flu Concern for early bronchitis and mild asthma exacerbation Concern for possible bacterial cause Will start Omnicef Advised to use inhaler and nebulizer treatments Continue over-the-counter cold medications If symptoms worsen or do not improve may need to go to urgent care for further evaluation Although it does not seem like COVID he is going to get tested on campus, which we both agree is a good idea

## 2019-06-25 ENCOUNTER — Other Ambulatory Visit: Payer: Self-pay | Admitting: Internal Medicine

## 2019-07-22 ENCOUNTER — Encounter: Payer: Self-pay | Admitting: Internal Medicine

## 2019-07-23 MED ORDER — AMPHETAMINE-DEXTROAMPHET ER 30 MG PO CP24: 30.0000 mg | ORAL_CAPSULE | ORAL | 0 refills | Status: DC

## 2019-07-25 ENCOUNTER — Encounter: Payer: Self-pay | Admitting: Internal Medicine

## 2019-08-02 ENCOUNTER — Other Ambulatory Visit: Payer: Self-pay

## 2019-08-02 MED ORDER — AMPHETAMINE-DEXTROAMPHET ER 30 MG PO CP24: 30.0000 mg | ORAL_CAPSULE | ORAL | 0 refills | Status: DC

## 2019-08-24 ENCOUNTER — Encounter: Payer: Self-pay | Admitting: Internal Medicine

## 2019-08-30 ENCOUNTER — Other Ambulatory Visit: Payer: Self-pay | Admitting: Family Medicine

## 2019-08-31 ENCOUNTER — Other Ambulatory Visit: Payer: Self-pay

## 2019-08-31 MED ORDER — PENNSAID 2 % EX SOLN: 2.0000 g | Freq: Two times a day (BID) | CUTANEOUS | 3 refills | Status: AC

## 2019-09-05 ENCOUNTER — Other Ambulatory Visit: Payer: Self-pay | Admitting: Internal Medicine

## 2019-09-05 MED ORDER — AMPHETAMINE-DEXTROAMPHET ER 30 MG PO CP24: 30.0000 mg | ORAL_CAPSULE | ORAL | 0 refills | Status: DC

## 2019-09-05 NOTE — Telephone Encounter (Signed)
[place] Controlled Database Checked Last filled: 08/04/19 # 30 LOV w/you: 05/09/19 Next appt w/you: None

## 2019-10-06 ENCOUNTER — Other Ambulatory Visit: Payer: Self-pay | Admitting: Internal Medicine

## 2019-10-14 ENCOUNTER — Other Ambulatory Visit: Payer: Self-pay | Admitting: Internal Medicine

## 2019-10-18 ENCOUNTER — Encounter: Payer: Self-pay | Admitting: Internal Medicine

## 2019-10-18 ENCOUNTER — Other Ambulatory Visit: Payer: Self-pay

## 2019-10-18 MED ORDER — AMPHETAMINE-DEXTROAMPHET ER 30 MG PO CP24: 30.0000 mg | ORAL_CAPSULE | ORAL | 0 refills | Status: DC

## 2019-10-18 NOTE — Telephone Encounter (Signed)
Last refill was 09/05/19. Pt sent a mychart message stating he requested this medication 2 times. I am not sure why we did not see them. Can you please fill?

## 2019-11-14 ENCOUNTER — Ambulatory Visit: Payer: BC Managed Care – PPO | Attending: Family

## 2019-11-14 DIAGNOSIS — Z23 Encounter for immunization: Secondary | ICD-10-CM

## 2019-11-14 NOTE — Progress Notes (Signed)
   Covid-19 Vaccination Clinic  Name:  Tony [name]    MRN: [id number] DOB: [birth year]  11/14/2019  Tony [name] was observed post Covid-19 immunization for 15 minutes without incidence. He was provided with Vaccine Information Sheet and instruction to access the V-Safe system.   Tony [name] was instructed to call 911 with any severe reactions post vaccine: Marland Kitchen Difficulty breathing  . Swelling of your face and throat  . A fast heartbeat  . A bad rash all over your body  . Dizziness and weakness    Immunizations Administered    Name Date Dose VIS Date Route   Moderna COVID-19 Vaccine 11/14/2019  2:04 PM 0.5 mL 08/23/2019 Intramuscular   Manufacturer: Moderna   Lot: [id number]   NDC: 81856-314-97

## 2019-11-16 ENCOUNTER — Other Ambulatory Visit: Payer: Self-pay

## 2019-11-16 MED ORDER — LOSARTAN POTASSIUM-HCTZ 50-12.5 MG PO TABS: 1.0000 | ORAL_TABLET | Freq: Every day | ORAL | 0 refills | Status: DC

## 2019-11-19 ENCOUNTER — Other Ambulatory Visit: Payer: Self-pay | Admitting: Internal Medicine

## 2019-11-22 ENCOUNTER — Encounter: Payer: Self-pay | Admitting: Internal Medicine

## 2019-11-24 ENCOUNTER — Other Ambulatory Visit: Payer: Self-pay | Admitting: Internal Medicine

## 2019-11-24 MED ORDER — AMPHETAMINE-DEXTROAMPHET ER 30 MG PO CP24: 30.0000 mg | ORAL_CAPSULE | ORAL | 0 refills | Status: DC

## 2019-11-28 NOTE — Telephone Encounter (Signed)
Last OV 05/09/19 Next OV NA Last RF 12/13/18

## 2019-12-02 ENCOUNTER — Other Ambulatory Visit: Payer: Self-pay | Admitting: Internal Medicine

## 2019-12-08 NOTE — Patient Instructions (Addendum)
°  Blood work was ordered.   ° ° °Medications reviewed and updated.  Changes include :   none ° ° ° °Please followup in 6 months ° ° °

## 2019-12-08 NOTE — Progress Notes (Signed)
Subjective:    Patient ID: Tony [name], male    DOB: 04-Jan-1976, 44 y.o.   MRN: [id number]  HPI The patient is here for follow up of their chronic medical problems, including anxiety, depression, ADD, prediabetes, hypertension, GERD, migraines, asthma   He is taking all of his medications as prescribed.   He is not exercising regularly.  He is not eating healthy.     He feels lack of motivation, but does not feel depressed.  He does feel anxious.  His job makes him anxious.     He has been tired a lot.  He does not feel like doing anything.   Left middle Finger pain:  It gets stuck when he wakes up and it hurts to get it unstuck. He knows it is probably arthritis.      Medications and allergies reviewed with patient and updated if appropriate.  Patient Active Problem List   Diagnosis Date Noted  . Cough 12/02/2018  . Bilateral shoulder bursitis 10/20/2018  . Prediabetes 06/03/2018  . Hepatitis, acute 07/24/2017  . Esophageal reflux 06/17/2016  . Nonallopathic lesion of cervical region 04/02/2016  . Nonallopathic lesion of thoracic region 04/02/2016  . Nonallopathic lesion-rib cage 04/02/2016  . Pain in right acromioclavicular joint 02/21/2016  . Chronic upper back pain 12/18/2015  . Depression 05/31/2015  . Overweight   . Asthma, mild intermittent 03/31/2013  . Migraine   . Obstructive sleep apnea   . Generalized anxiety disorder   . Seasonal and perennial allergic rhinitis   . Essential hypertension   . ADD (attention deficit disorder)     Current Outpatient Medications on File Prior to Visit  Medication Sig Dispense Refill  . acyclovir ointment (ZOVIRAX) 5 % APPLY TOPICALLY EVERY 3 HOURS 15 g 0  . albuterol (PROVENTIL) (2.5 MG/3ML) 0.083% nebulizer solution Take 3 mLs (2.5 mg total) by nebulization every 6 (six) hours as needed for wheezing or shortness of breath. 75 mL 12  . ALPRAZolam (XANAX) 0.5 MG tablet TAKE ONE TABLET BY MOUTH TWICE A DAY AS NEEDED FOR  ANXIETY 60 tablet 0  . amphetamine-dextroamphetamine (ADDERALL XR) 30 MG 24 hr capsule Take 1 capsule (30 mg total) by mouth every morning. 30 capsule 0  . Azelastine-Fluticasone 137-50 MCG/ACT SUSP Place 1 spray into the nose 2 (two) times daily. 23 g 5  . butalbital-acetaminophen-caffeine (FIORICET, ESGIC) 50-325-40 MG tablet TAKE 1-2 TABLETS BY MOUTH EVERY 6 HOURS AS NEEDED FOR MIGRAINE 20 tablet 0  . chlorpheniramine (CHLOR-TRIMETON) 4 MG tablet Take 1 tablet (4 mg total) by mouth every 6 (six) hours as needed for allergies. 120 tablet 3  . Diclofenac Sodium (PENNSAID) 2 % SOLN Place 2 g onto the skin 2 (two) times daily. 112 g 3  . DUEXIS 800-26.6 MG TABS TAKE ONE TABLET BY MOUTH THREE TIMES DAILY 270 tablet 1  . fluticasone (FLONASE) 50 MCG/ACT nasal spray USE TWO SPRAY IN NOSE ONCE DAILY 16 g 5  . hydrOXYzine (ATARAX/VISTARIL) 10 MG tablet Take 1 tablet (10 mg total) by mouth 3 (three) times daily as needed. 30 tablet 0  . Insulin Pen Needle (BD ULTRA-FINE MICRO PEN NEEDLE) 32G X 6 MM MISC Use with Saxenda 100 each 1  . levalbuterol (XOPENEX HFA) 45 MCG/ACT inhaler Inhale 2 puffs into the lungs every 6 (six) hours as needed for wheezing. 3 Inhaler 0  . losartan-hydrochlorothiazide (HYZAAR) 50-12.5 MG tablet TAKE 1 TABLET BY MOUTH DAILY 90 tablet 1  . pantoprazole (PROTONIX) 40  MG tablet Take 1 tablet (40 mg total) by mouth daily. Need office visit for more refills. 30 tablet 0  . sertraline (ZOLOFT) 100 MG tablet Take 1.5 tablets (150 mg total) by mouth daily. 135 tablet 1  . SUMAtriptan (IMITREX) 6 MG/0.5ML SOLN injection Inject 0.5 mLs (6 mg total) into the skin every 2 (two) hours as needed for migraine or headache. May repeat in 2 hrs if headache persists 0.5 mL 5  . tiZANidine (ZANAFLEX) 4 MG tablet Take 1 tablet (4 mg total) by mouth at bedtime. 30 tablet 0   No current facility-administered medications on file prior to visit.    Past Medical History:  Diagnosis Date  . Allergic  rhinitis   . Anxiety    overlap with depression and ADD  . Asthma   . Hepatitis A   . Hypertension   . Migraine   . Migraines   . OSA on CPAP    Sleep study 12/2011: severe, CPAP 94mmHg    Past Surgical History:  Procedure Laterality Date  . LASIK  2005  . WISDOM TOOTH EXTRACTION      Social History   Socioeconomic History  . Marital status: Single    Spouse name: Not on file  . Number of children: Not on file  . Years of education: Not on file  . Highest education level: Not on file  Occupational History  . Occupation: RESIDENT Financial trader: [employer]  Tobacco Use  . Smoking status: Former Smoker    Packs/day: 0.24    Years: 3.00    Pack years: 0.72    Types: Cigarettes    Quit date: 09/22/2008    Years since quitting: 11.2  . Smokeless tobacco: Never Used  Substance and Sexual Activity  . Alcohol use: Yes    Alcohol/week: 0.0 standard drinks    Comment: OCCASSIONAL  . Drug use: No  . Sexual activity: Not on file  Other Topics Concern  . Not on file  Social History Narrative   Single, employed with residents life as Ambulance person at [employer] Strain:   . Difficulty of Paying Living Expenses:   Food Insecurity:   . Worried About Charity fundraiser in the Last Year:   . Arboriculturist in the Last Year:   Transportation Needs:   . Film/video editor (Medical):   Marland Kitchen Lack of Transportation (Non-Medical):   Physical Activity:   . Days of Exercise per Week:   . Minutes of Exercise per Session:   Stress:   . Feeling of Stress :   Social Connections:   . Frequency of Communication with Friends and Family:   . Frequency of Social Gatherings with Friends and Family:   . Attends Religious Services:   . Active Member of Clubs or Organizations:   . Attends Archivist Meetings:   Marland Kitchen Marital Status:     Family History  Problem Relation Age of Onset  . Hypertension  Mother   . Arthritis Father   . Hypertension Father   . Lung cancer Maternal Grandfather   . Diabetes Other   . Emphysema Paternal Grandfather   . Asthma Paternal Grandfather     Review of Systems  Constitutional: Negative for chills and fever.  Respiratory: Positive for cough (allergy related) and shortness of breath (from allergies). Negative for wheezing.   Cardiovascular: Positive for palpitations (occ).  Negative for chest pain and leg swelling.  Genitourinary: Negative for difficulty urinating and dysuria.       Pressure at times with urinating, has seen sperm in urine at times  Neurological: Positive for headaches. Negative for light-headedness.  Psychiatric/Behavioral: Negative for dysphoric mood. The patient is nervous/anxious.        Objective:   Vitals:   12/09/19 1453  BP: 110/74  Pulse: (!) 104  Temp: 98.1 F (36.7 C)  SpO2: 98%   BP Readings from Last 3 Encounters:  12/09/19 110/74  05/27/19 110/78  05/09/19 138/84   Wt Readings from Last 3 Encounters:  12/09/19 196 lb 6.4 oz (89.1 kg)  05/09/19 186 lb 12.8 oz (84.7 kg)  12/02/18 187 lb (84.8 kg)   Body mass index is 30.76 kg/m.   Physical Exam    Constitutional: Appears well-developed and well-nourished. No distress.  HENT:  Head: Normocephalic and atraumatic.  Neck: Neck supple. No tracheal deviation present. No thyromegaly present.  No cervical lymphadenopathy Cardiovascular: Normal rate, regular rhythm and normal heart sounds.   No murmur heard. No carotid bruit .  No edema Pulmonary/Chest: Effort normal and breath sounds normal. No respiratory distress. No has no wheezes. No rales.  Skin: Skin is warm and dry. Not diaphoretic.  Psychiatric: Normal mood and affect. Behavior is normal.      Assessment & Plan:    See Problem List for Assessment and Plan of chronic medical problems.    This visit occurred during the SARS-CoV-2 public health emergency.  Safety protocols were in place,  including screening questions prior to the visit, additional usage of staff PPE, and extensive cleaning of exam room while observing appropriate contact time as indicated for disinfecting solutions.

## 2019-12-09 ENCOUNTER — Other Ambulatory Visit: Payer: Self-pay

## 2019-12-09 ENCOUNTER — Ambulatory Visit: Payer: BC Managed Care – PPO | Admitting: Internal Medicine

## 2019-12-09 ENCOUNTER — Encounter: Payer: Self-pay | Admitting: Internal Medicine

## 2019-12-09 VITALS — BP 110/74 | HR 104 | Temp 98.1°F | Ht 67.0 in | Wt 196.4 lb

## 2019-12-09 DIAGNOSIS — Z9189 Other specified personal risk factors, not elsewhere classified: Secondary | ICD-10-CM | POA: Insufficient documentation

## 2019-12-09 DIAGNOSIS — J4520 Mild intermittent asthma, uncomplicated: Secondary | ICD-10-CM

## 2019-12-09 DIAGNOSIS — F3289 Other specified depressive episodes: Secondary | ICD-10-CM | POA: Diagnosis not present

## 2019-12-09 DIAGNOSIS — I10 Essential (primary) hypertension: Secondary | ICD-10-CM

## 2019-12-09 DIAGNOSIS — K219 Gastro-esophageal reflux disease without esophagitis: Secondary | ICD-10-CM

## 2019-12-09 DIAGNOSIS — G43909 Migraine, unspecified, not intractable, without status migrainosus: Secondary | ICD-10-CM | POA: Diagnosis not present

## 2019-12-09 DIAGNOSIS — F411 Generalized anxiety disorder: Secondary | ICD-10-CM

## 2019-12-09 DIAGNOSIS — R7303 Prediabetes: Secondary | ICD-10-CM

## 2019-12-09 DIAGNOSIS — F988 Other specified behavioral and emotional disorders with onset usually occurring in childhood and adolescence: Secondary | ICD-10-CM

## 2019-12-09 DIAGNOSIS — R399 Unspecified symptoms and signs involving the genitourinary system: Secondary | ICD-10-CM | POA: Insufficient documentation

## 2019-12-09 LAB — COMPREHENSIVE METABOLIC PANEL
ALT: 28 U/L (ref 0–53)
AST: 17 U/L (ref 0–37)
Albumin: 4.7 g/dL (ref 3.5–5.2)
Alkaline Phosphatase: 93 U/L (ref 39–117)
BUN: 15 mg/dL (ref 6–23)
CO2: 31 mEq/L (ref 19–32)
Calcium: 9.4 mg/dL (ref 8.4–10.5)
Chloride: 97 mEq/L (ref 96–112)
Creatinine, Ser: 1.04 mg/dL (ref 0.40–1.50)
GFR: 77.4 mL/min (ref >60.00)
Glucose, Bld: 85 mg/dL (ref 70–99)
Potassium: 3.9 mEq/L (ref 3.5–5.1)
Sodium: 133 mEq/L — ABNORMAL LOW (ref 135–145)
Total Bilirubin: 0.4 mg/dL (ref 0.2–1.2)
Total Protein: 7.6 g/dL (ref 6.0–8.3)

## 2019-12-09 LAB — HEMOGLOBIN A1C: Hgb A1c MFr Bld: 5.8 % (ref 4.6–6.5)

## 2019-12-09 NOTE — Assessment & Plan Note (Signed)
Chronic Controlled Continue current medications

## 2019-12-09 NOTE — Assessment & Plan Note (Signed)
Chronic Continue allergy medication, albuterol prn controlled

## 2019-12-09 NOTE — Assessment & Plan Note (Signed)
Chronic Controlled, stable Continue current dose of medication  

## 2019-12-09 NOTE — Assessment & Plan Note (Signed)
Chronic GERD controlled Continue daily medication  

## 2019-12-09 NOTE — Assessment & Plan Note (Signed)
Chronic His lack of energy and motivation is not related to depression Controlled, stable Continue current dose of medication

## 2019-12-09 NOTE — Assessment & Plan Note (Signed)
Chronic BP well controlled Current regimen effective and well tolerated Continue current medications at current doses cmp  

## 2019-12-09 NOTE — Assessment & Plan Note (Signed)
acute occ pressure with urination and occasionally sees sperm in urine Deferred referral Will monitor for now and let me know if this persists

## 2019-12-09 NOTE — Assessment & Plan Note (Signed)
Acute Has some anxiety, but depression controlled Stressed regular exercise - he admits this has helped him in the past and he thinks that may help

## 2019-12-09 NOTE — Assessment & Plan Note (Signed)
Chronic Check a1c Low sugar / carb diet Stressed regular exercise  

## 2019-12-12 NOTE — Progress Notes (Deleted)
[facility] [place] Phone: [phone] Subjective:    I'm seeing this patient by the request  of:  Binnie Rail, MD  CC:   [id number]   05/27/2019 Bilateral injections given, discussed icing regimen and home exercises, discussed which activities to doing which wants to avoid.  Increase activity as tolerated.  Follow-up again in 4 to 6 weeks  Update 12/13/2019 Tony [name] is a 44 y.o. male coming in with complaint of ***     Past Medical History:  Diagnosis Date  . Allergic rhinitis   . Anxiety    overlap with depression and ADD  . Asthma   . Hepatitis A   . Hypertension   . Migraine   . Migraines   . OSA on CPAP    Sleep study 12/2011: severe, CPAP 21mmHg   Past Surgical History:  Procedure Laterality Date  . LASIK  2005  . WISDOM TOOTH EXTRACTION     Social History   Socioeconomic History  . Marital status: Single    Spouse name: Not on file  . Number of children: Not on file  . Years of education: Not on file  . Highest education level: Not on file  Occupational History  . Occupation: RESIDENT Financial trader: [employer]  Tobacco Use  . Smoking status: Former Smoker    Packs/day: 0.24    Years: 3.00    Pack years: 0.72    Types: Cigarettes    Quit date: 09/22/2008    Years since quitting: 11.2  . Smokeless tobacco: Never Used  Substance and Sexual Activity  . Alcohol use: Yes    Alcohol/week: 0.0 standard drinks    Comment: OCCASSIONAL  . Drug use: No  . Sexual activity: Not on file  Other Topics Concern  . Not on file  Social History Narrative   Single, employed with residents life as Ambulance person at [employer] Strain:   . Difficulty of Paying Living Expenses:   Food Insecurity:   . Worried About Charity fundraiser in the Last Year:   . Arboriculturist in the Last Year:   Transportation Needs:     . Film/video editor (Medical):   Marland Kitchen Lack of Transportation (Non-Medical):   Physical Activity:   . Days of Exercise per Week:   . Minutes of Exercise per Session:   Stress:   . Feeling of Stress :   Social Connections:   . Frequency of Communication with Friends and Family:   . Frequency of Social Gatherings with Friends and Family:   . Attends Religious Services:   . Active Member of Clubs or Organizations:   . Attends Archivist Meetings:   Marland Kitchen Marital Status:    Allergies  Allergen Reactions  . Erythromycin     Pt states med make him sick   Family History  Problem Relation Age of Onset  . Hypertension Mother   . Arthritis Father   . Hypertension Father   . Lung cancer Maternal Grandfather   . Diabetes Other   . Emphysema Paternal Grandfather   . Asthma Paternal Grandfather      Current Outpatient Medications (Cardiovascular):  .  losartan-hydrochlorothiazide (HYZAAR) 50-12.5 MG tablet, TAKE 1 TABLET BY MOUTH DAILY  Current Outpatient Medications (Respiratory):  .  albuterol (PROVENTIL) (2.5 MG/3ML) 0.083% nebulizer solution, Take  3 mLs (2.5 mg total) by nebulization every 6 (six) hours as needed for wheezing or shortness of breath. .  Azelastine-Fluticasone 137-50 MCG/ACT SUSP, Place 1 spray into the nose 2 (two) times daily. .  chlorpheniramine (CHLOR-TRIMETON) 4 MG tablet, Take 1 tablet (4 mg total) by mouth every 6 (six) hours as needed for allergies. .  fluticasone (FLONASE) 50 MCG/ACT nasal spray, USE TWO SPRAY IN NOSE ONCE DAILY .  levalbuterol (XOPENEX HFA) 45 MCG/ACT inhaler, Inhale 2 puffs into the lungs every 6 (six) hours as needed for wheezing.  Current Outpatient Medications (Analgesics):  .  butalbital-acetaminophen-caffeine (FIORICET, ESGIC) 50-325-40 MG tablet, TAKE 1-2 TABLETS BY MOUTH EVERY 6 HOURS AS NEEDED FOR MIGRAINE .  DUEXIS 800-26.6 MG TABS, TAKE ONE TABLET BY MOUTH THREE TIMES DAILY .  SUMAtriptan (IMITREX) 6 MG/0.5ML SOLN  injection, Inject 0.5 mLs (6 mg total) into the skin every 2 (two) hours as needed for migraine or headache. May repeat in 2 hrs if headache persists   Current Outpatient Medications (Other):  .  acyclovir ointment (ZOVIRAX) 5 %, APPLY TOPICALLY EVERY 3 HOURS .  ALPRAZolam (XANAX) 0.5 MG tablet, TAKE ONE TABLET BY MOUTH TWICE A DAY AS NEEDED FOR ANXIETY .  amphetamine-dextroamphetamine (ADDERALL XR) 30 MG 24 hr capsule, Take 1 capsule (30 mg total) by mouth every morning. .  Diclofenac Sodium (PENNSAID) 2 % SOLN, Place 2 g onto the skin 2 (two) times daily. .  hydrOXYzine (ATARAX/VISTARIL) 10 MG tablet, Take 1 tablet (10 mg total) by mouth 3 (three) times daily as needed. .  Insulin Pen Needle (BD ULTRA-FINE MICRO PEN NEEDLE) 32G X 6 MM MISC, Use with Saxenda .  pantoprazole (PROTONIX) 40 MG tablet, Take 1 tablet (40 mg total) by mouth daily. Need office visit for more refills. .  sertraline (ZOLOFT) 100 MG tablet, Take 1.5 tablets (150 mg total) by mouth daily. Marland Kitchen  tiZANidine (ZANAFLEX) 4 MG tablet, Take 1 tablet (4 mg total) by mouth at bedtime.   Reviewed prior external information including notes and imaging from  primary care provider As well as notes that were available from care everywhere and other healthcare systems.  Past medical history, social, surgical and family history all reviewed in electronic medical record.  No pertanent information unless stated regarding to the chief complaint.   Review of Systems:  No headache, visual changes, nausea, vomiting, diarrhea, constipation, dizziness, abdominal pain, skin rash, fevers, chills, night sweats, weight loss, swollen lymph nodes, body aches, joint swelling, chest pain, shortness of breath, mood changes. POSITIVE muscle aches  Objective  There were no vitals taken for this visit.   General: No apparent distress alert and oriented x3 mood and affect normal, dressed appropriately.  HEENT: Pupils equal, extraocular movements intact    Respiratory: Patient's speak in full sentences and does not appear short of breath  Cardiovascular: No lower extremity edema, non tender, no erythema  Neuro: Cranial nerves II through XII are intact, neurovascularly intact in all extremities with 2+ DTRs and 2+ pulses.  Gait normal with good balance and coordination.  MSK:  Non tender with full range of motion and good stability and symmetric strength and tone of shoulders, elbows, wrist, hip, knee and ankles bilaterally.     Impression and Recommendations:     This case required medical decision making of moderate complexity. The above documentation has been reviewed and is accurate and complete Judi Saa, DO       Note: This dictation was prepared with  Dragon dictation along with smaller Company secretary. Any transcriptional errors that result from this process are unintentional.

## 2019-12-13 ENCOUNTER — Ambulatory Visit: Payer: BC Managed Care – PPO | Admitting: Family Medicine

## 2019-12-20 ENCOUNTER — Ambulatory Visit: Payer: BC Managed Care – PPO | Attending: Family

## 2019-12-20 DIAGNOSIS — Z23 Encounter for immunization: Secondary | ICD-10-CM

## 2019-12-20 NOTE — Progress Notes (Signed)
   Covid-19 Vaccination Clinic  Name:  Tony [name]    MRN: [id number] DOB: 21-Dec-1975  12/20/2019  [name] was observed post Covid-19 immunization for 15 minutes without incident. He was provided with Vaccine Information Sheet and instruction to access the V-Safe system.   [name] was instructed to call 911 with any severe reactions post vaccine: Marland Kitchen Difficulty breathing  . Swelling of face and throat  . A fast heartbeat  . A bad rash all over body  . Dizziness and weakness   Immunizations Administered    Name Date Dose VIS Date Route   Moderna COVID-19 Vaccine 12/20/2019  1:34 PM 0.5 mL 08/23/2019 Intramuscular   Manufacturer: Moderna   Lot: [id number]   NDC: 99357-017-79

## 2019-12-27 ENCOUNTER — Ambulatory Visit (INDEPENDENT_AMBULATORY_CARE_PROVIDER_SITE_OTHER): Payer: BC Managed Care – PPO

## 2019-12-27 ENCOUNTER — Other Ambulatory Visit: Payer: Self-pay

## 2019-12-27 ENCOUNTER — Encounter: Payer: Self-pay | Admitting: Family Medicine

## 2019-12-27 ENCOUNTER — Ambulatory Visit: Payer: BC Managed Care – PPO | Admitting: Family Medicine

## 2019-12-27 VITALS — BP 132/84 | HR 107 | Ht 67.0 in | Wt 198.0 lb

## 2019-12-27 DIAGNOSIS — M7551 Bursitis of right shoulder: Secondary | ICD-10-CM

## 2019-12-27 DIAGNOSIS — M65332 Trigger finger, left middle finger: Secondary | ICD-10-CM

## 2019-12-27 DIAGNOSIS — M7552 Bursitis of left shoulder: Secondary | ICD-10-CM | POA: Diagnosis not present

## 2019-12-27 DIAGNOSIS — M999 Biomechanical lesion, unspecified: Secondary | ICD-10-CM

## 2019-12-27 NOTE — Patient Instructions (Addendum)
Brace at night for 2 weeks Injected trigger finger and both shoulder See me again in 3 months

## 2019-12-27 NOTE — Assessment & Plan Note (Signed)
Injection given today, discussed bracing at night, discussed avoiding repetitive activity.  Follow-up again in 4 to 8 weeks

## 2019-12-27 NOTE — Assessment & Plan Note (Signed)
   Decision today to treat with OMT was based on Physical Exam  After verbal consent patient was treated with HVLA, ME, FPR techniques in cervical, thoracic, rib,areas, all areas are chronic   Patient tolerated the procedure well with improvement in symptoms  Patient given exercises, stretches and lifestyle modifications  See medications in patient instructions if given  Patient will follow up in 4-8 weeks 

## 2019-12-27 NOTE — Assessment & Plan Note (Signed)
December 27, 2019 patient given repeat injections today, tolerated procedure well, discussed icing regimen.  Chronic problem with worsening symptoms.  Discussed topical anti-inflammatories which were refilled today and discussed oral anti-inflammatories use as needed also refilled.  Follow-up with me again in 3 months

## 2019-12-27 NOTE — Progress Notes (Signed)
Tony [name] Phone: [phone] Subjective:   Tony [name], am serving as a scribe for Dr. Hulan Saas. This visit occurred during the SARS-CoV-2 public health emergency.  Safety protocols were in place, including screening questions prior to the visit, additional usage of staff PPE, and extensive cleaning of exam room while observing appropriate contact time as indicated for disinfecting solutions.   I'm seeing this patient by the request  of:  Tony Rail, MD  CC: Shoulder pain, facial pain, neck pain  [id number]   05/27/2019 Bilateral injections given, discussed icing regimen and home exercises, discussed which activities to doing which wants to avoid.  Increase activity as tolerated.  Follow-up again in 4 to 6 weeks  Update 12/27/2019 Tony [name] is a 44 y.o. male coming in with complaint of bilateral shoulder pain. Patient states that his shoulders have started to both him again.  Having left hand middle finger trigger finger for two weeks.        Past Medical History:  Diagnosis Date  . Allergic rhinitis   . Anxiety    overlap with depression and ADD  . Asthma   . Hepatitis A   . Hypertension   . Migraine   . Migraines   . OSA on CPAP    Sleep study 12/2011: severe, CPAP 45mmHg   Past Surgical History:  Procedure Laterality Date  . LASIK  2005  . WISDOM TOOTH EXTRACTION     Social History   Socioeconomic History  . Marital status: Single    Spouse name: Not on file  . Number of children: Not on file  . Years of education: Not on file  . Highest education level: Not on file  Occupational History  . Occupation: RESIDENT Financial trader: [employer]  Tobacco Use  . Smoking status: Former Smoker    Packs/day: 0.24    Years: 3.00    Pack years: 0.72    Types: Cigarettes    Quit date: 09/22/2008    Years since quitting: 11.2  . Smokeless tobacco: Never Used   Substance and Sexual Activity  . Alcohol use: Yes    Alcohol/week: 0.0 standard drinks    Comment: OCCASSIONAL  . Drug use: [name]  . Sexual activity: Not on file  Other Topics Concern  . Not on file  Social History Narrative   Single, employed with residents life as Ambulance person at [employer] Strain:   . Difficulty of Paying Living Expenses:   Food Insecurity:   . Worried About Charity fundraiser in the Last Year:   . Arboriculturist in the Last Year:   Transportation Needs:   . Film/video editor (Medical):   Marland Kitchen Lack of Transportation (Non-Medical):   Physical Activity:   . Days of Exercise per Week:   . Minutes of Exercise per Session:   Stress:   . Feeling of Stress :   Social Connections:   . Frequency of Communication with Friends and Family:   . Frequency of Social Gatherings with Friends and Family:   . Attends Religious Services:   . Active Member of Clubs or Organizations:   . Attends Archivist Meetings:   Marland Kitchen Marital Status:    Allergies  Allergen Reactions  . Erythromycin     Pt states med make him sick  Family History  Problem Relation Age of Onset  . Hypertension Mother   . Arthritis Father   . Hypertension Father   . Lung cancer Maternal Grandfather   . Diabetes Other   . Emphysema Paternal Grandfather   . Asthma Paternal Grandfather      Current Outpatient Medications (Cardiovascular):  .  losartan-hydrochlorothiazide (HYZAAR) 50-12.5 MG tablet, TAKE 1 TABLET BY MOUTH DAILY  Current Outpatient Medications (Respiratory):  .  albuterol (PROVENTIL) (2.5 MG/3ML) 0.083% nebulizer solution, Take 3 mLs (2.5 mg total) by nebulization every 6 (six) hours as needed for wheezing or shortness of breath. .  Azelastine-Fluticasone 137-50 MCG/ACT SUSP, Place 1 spray into the nose 2 (two) times daily. .  chlorpheniramine (CHLOR-TRIMETON) 4 MG tablet, Take 1 tablet (4 mg total) by mouth  every 6 (six) hours as needed for allergies. .  fluticasone (FLONASE) 50 MCG/ACT nasal spray, USE TWO SPRAY IN NOSE ONCE DAILY .  levalbuterol (XOPENEX HFA) 45 MCG/ACT inhaler, Inhale 2 puffs into the lungs every 6 (six) hours as needed for wheezing.  Current Outpatient Medications (Analgesics):  .  butalbital-acetaminophen-caffeine (FIORICET, ESGIC) 50-325-40 MG tablet, TAKE 1-2 TABLETS BY MOUTH EVERY 6 HOURS AS NEEDED FOR MIGRAINE .  DUEXIS 800-26.6 MG TABS, TAKE ONE TABLET BY MOUTH THREE TIMES DAILY .  SUMAtriptan (IMITREX) 6 MG/0.5ML SOLN injection, Inject 0.5 mLs (6 mg total) into the skin every 2 (two) hours as needed for migraine or headache. May repeat in 2 hrs if headache persists   Current Outpatient Medications (Other):  .  acyclovir ointment (ZOVIRAX) 5 %, APPLY TOPICALLY EVERY 3 HOURS .  ALPRAZolam (XANAX) 0.5 MG tablet, TAKE ONE TABLET BY MOUTH TWICE A DAY AS NEEDED FOR ANXIETY .  amphetamine-dextroamphetamine (ADDERALL XR) 30 MG 24 hr capsule, Take 1 capsule (30 mg total) by mouth every morning. .  Diclofenac Sodium (PENNSAID) 2 % SOLN, Place 2 g onto the skin 2 (two) times daily. .  hydrOXYzine (ATARAX/VISTARIL) 10 MG tablet, Take 1 tablet (10 mg total) by mouth 3 (three) times daily as needed. .  Insulin Pen Needle (BD ULTRA-FINE MICRO PEN NEEDLE) 32G X 6 MM MISC, Use with Saxenda .  pantoprazole (PROTONIX) 40 MG tablet, Take 1 tablet (40 mg total) by mouth daily. Need office visit for more refills. .  sertraline (ZOLOFT) 100 MG tablet, Take 1.5 tablets (150 mg total) by mouth daily. Marland Kitchen  tiZANidine (ZANAFLEX) 4 MG tablet, Take 1 tablet (4 mg total) by mouth at bedtime.   Reviewed prior external information including notes and imaging from  primary care provider As well as notes that were available from care everywhere and other healthcare systems.  Past medical history, social, surgical and family history all reviewed in electronic medical record.  [name] pertanent information  unless stated regarding to the chief complaint.   Review of Systems:  [name] headache, visual changes, nausea, vomiting, diarrhea, constipation, dizziness, abdominal pain, skin rash, fevers, chills, night sweats, weight loss, swollen lymph nodes, , joint swelling, chest pain, shortness of breath, mood changes. POSITIVE muscle aches, body aches  Objective  Blood pressure 132/84, pulse (!) 107, height 5\' 7"  (1.702 m), weight 198 lb (89.8 kg), SpO2 99 %.   General: [name] apparent distress alert and oriented x3 mood and affect normal, dressed appropriately.  HEENT: Pupils equal, extraocular movements intact  Respiratory: Patient's speak in full sentences and does not appear short of breath  Cardiovascular: [name] lower extremity edema, non tender, [name] erythema  Neuro: Cranial nerves II through  XII are intact, neurovascularly intact in all extremities with 2+ DTRs and 2+ pulses.  Gait normal with good balance and coordination.  MSK:   Bilateral shoulder exam shows the patient does have positive impingement.  Patient does have tenderness to palpation mild over the glenohumeral joint bilaterally.  5 or 5 strength of the rotator cuff noted. Neck exam does have some loss of lordosis. Negative Spurling's.  Crepitus with range of motion in all planes by at least 5 degrees.  5 out of 5 strength of the upper extremities bilaterally  Left hand does show the patient does have a trigger nodule noted of the A2 pulley of the middle finger.  Severely tender to palpation in the area.  Mild triggering noted even today with range of motion. Procedure: Real-time Ultrasound Guided Injection of right glenohumeral joint Device: GE Logiq Q7  Ultrasound guided injection is preferred based studies that show increased duration, increased effect, greater accuracy, decreased procedural pain, increased response rate with ultrasound guided versus blind injection.  Verbal informed consent obtained.  Time-out conducted.  Noted [name]  overlying erythema, induration, or other signs of local infection.  Skin prepped in a sterile fashion.  Local anesthesia: Topical Ethyl chloride.  With sterile technique and under real time ultrasound guidance:  Joint visualized.  23g 1  inch needle inserted posterior approach. Pictures taken for needle placement. Patient did have injection of 2 cc of 1% lidocaine, 2 cc of 0.5% Marcaine, and 1.0 cc of Kenalog 40 mg/dL. Completed without difficulty  Pain immediately resolved suggesting accurate placement of the medication.  Advised to call if fevers/chills, erythema, induration, drainage, or persistent bleeding.  Images permanently stored and available for review in the ultrasound unit.  Impression: Technically successful ultrasound guided injection.  Procedure: Real-time Ultrasound Guided Injection of left glenohumeral joint Device: GE Logiq E  Ultrasound guided injection is preferred based studies that show increased duration, increased effect, greater accuracy, decreased procedural pain, increased response rate with ultrasound guided versus blind injection.  Verbal informed consent obtained.  Time-out conducted.  Noted [name] overlying erythema, induration, or other signs of local infection.  Skin prepped in a sterile fashion.  Local anesthesia: Topical Ethyl chloride.  With sterile technique and under real time ultrasound guidance:  Joint visualized.  21g 2 inch needle inserted posterior approach. Pictures taken for needle placement. Patient did have injection of 2 cc of 0.5% Marcaine, and 1cc of Kenalog 40 mg/dL. Completed without difficulty  Pain immediately resolved suggesting accurate placement of the medication.  Advised to call if fevers/chills, erythema, induration, drainage, or persistent bleeding.  Images permanently stored and available for review in the ultrasound unit.  Impression: Technically successful ultrasound guided injection.  Procedure: Real-time Ultrasound Guided  Injection of left flexor tendon sheath middle finger Device: GE Logiq Q7 Ultrasound guided injection is preferred based studies that show increased duration, increased effect, greater accuracy, decreased procedural pain, increased response rate, and decreased cost with ultrasound guided versus blind injection.  Verbal informed consent obtained.  Time-out conducted.  Noted [name] overlying erythema, induration, or other signs of local infection.  Skin prepped in a sterile fashion.  Local anesthesia: Topical Ethyl chloride.  With sterile technique and under real time ultrasound guidance: With a 25-gauge half inch needle injected with 0.5 cc of 0.5% Marcaine and 0.5 cc of Kenalog 40 mg/mL into the tendon sheath Completed without difficulty  Pain immediately resolved suggesting accurate placement of the medication.  Advised to call if fevers/chills, erythema, induration, drainage, or  persistent bleeding.  Images permanently stored and available for review in the ultrasound unit.  Impression: Technically successful ultrasound guided injection.  Osteopathic findings C2 flexed rotated and side bent right T3 extended rotated and side bent right inhaled third rib T9 extended rotated and side bent left     Impression and Recommendations:     This case required medical decision making of moderate complexity. The above documentation has been reviewed and is accurate and complete Judi Saa, DO       Note: This dictation was prepared with Dragon dictation along with smaller phrase technology. Any transcriptional errors that result from this process are unintentional.

## 2020-01-05 ENCOUNTER — Encounter: Payer: Self-pay | Admitting: Internal Medicine

## 2020-01-09 MED ORDER — AMPHETAMINE-DEXTROAMPHET ER 30 MG PO CP24: 30.0000 mg | ORAL_CAPSULE | ORAL | 0 refills | Status: DC
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
## 2020-02-01 ENCOUNTER — Other Ambulatory Visit: Payer: Self-pay

## 2020-02-01 ENCOUNTER — Ambulatory Visit: Payer: BC Managed Care – PPO | Admitting: Internal Medicine

## 2020-02-01 ENCOUNTER — Encounter: Payer: Self-pay | Admitting: Internal Medicine

## 2020-02-01 VITALS — BP 160/98 | HR 78 | Temp 98.9°F | Resp 16 | Ht 67.0 in | Wt 197.6 lb

## 2020-02-01 DIAGNOSIS — F411 Generalized anxiety disorder: Secondary | ICD-10-CM

## 2020-02-01 DIAGNOSIS — F3289 Other specified depressive episodes: Secondary | ICD-10-CM | POA: Diagnosis not present

## 2020-02-01 DIAGNOSIS — J3089 Other allergic rhinitis: Secondary | ICD-10-CM | POA: Diagnosis not present

## 2020-02-01 DIAGNOSIS — J302 Other seasonal allergic rhinitis: Secondary | ICD-10-CM

## 2020-02-01 DIAGNOSIS — G43909 Migraine, unspecified, not intractable, without status migrainosus: Secondary | ICD-10-CM

## 2020-02-01 DIAGNOSIS — I10 Essential (primary) hypertension: Secondary | ICD-10-CM

## 2020-02-01 MED ORDER — PANTOPRAZOLE SODIUM 40 MG PO TBEC: 40.0000 mg | DELAYED_RELEASE_TABLET | Freq: Every day | ORAL | 1 refills | Status: DC

## 2020-02-01 MED ORDER — SUMATRIPTAN SUCCINATE 6 MG/0.5ML ~~LOC~~ SOLN: 6.0000 mg | SUBCUTANEOUS | 5 refills | Status: DC | PRN

## 2020-02-01 NOTE — Assessment & Plan Note (Signed)
Chronic Increased recently due to many things Interested in seeing a therapist - referred today Will continue current medications - we both agree the only thing that is going to help is to get out of his situation, which he is working on  Continue xanax BID prn Sertraline 150 mg daily

## 2020-02-01 NOTE — Assessment & Plan Note (Signed)
Chronic He is having increased migraines Takes imitrex as needed Refilled today

## 2020-02-01 NOTE — Patient Instructions (Addendum)
Medications reviewed and updated.  Changes include :   none  Your prescription(s) have been submitted to your pharmacy. Please take as directed and contact our office if you believe you are having problem(s) with the medication(s).  A referral was ordered for an allergies and behavioral health.    Someone will call you to schedule this.    Please followup in 6 months

## 2020-02-01 NOTE — Assessment & Plan Note (Signed)
BP Readings from Last 3 Encounters:  02/01/20 (!) 160/98  12/27/19 132/84  12/09/19 110/74   Chronic BP elevated here today - but typically controlled BP elevation likely related to increased stress/depression Continue current medication

## 2020-02-01 NOTE — Assessment & Plan Note (Signed)
Chronic Taking a nasal spray He is interested in seeing allergy to be retested for allergies - he is concerned about living where he is living now - it likely has mold and cockroaches Referral ordered

## 2020-02-01 NOTE — Assessment & Plan Note (Signed)
Chronic Increased depression due to current situation, covid, etc Will continue sertraline 150 mg daily Referred to a therapist F/u if medication is not working

## 2020-02-02 ENCOUNTER — Encounter: Payer: Self-pay | Admitting: Internal Medicine

## 2020-02-02 MED ORDER — SUMATRIPTAN SUCCINATE 100 MG PO TABS: 100.0000 mg | ORAL_TABLET | ORAL | 5 refills | Status: DC | PRN

## 2020-02-13 ENCOUNTER — Ambulatory Visit (INDEPENDENT_AMBULATORY_CARE_PROVIDER_SITE_OTHER): Payer: BC Managed Care – PPO | Admitting: Psychology

## 2020-02-13 DIAGNOSIS — F332 Major depressive disorder, recurrent severe without psychotic features: Secondary | ICD-10-CM

## 2020-02-13 DIAGNOSIS — F411 Generalized anxiety disorder: Secondary | ICD-10-CM | POA: Diagnosis not present

## 2020-02-22 ENCOUNTER — Encounter: Payer: Self-pay | Admitting: Internal Medicine

## 2020-02-23 MED ORDER — AMPHETAMINE-DEXTROAMPHET ER 30 MG PO CP24: 30.0000 mg | ORAL_CAPSULE | ORAL | 0 refills | Status: DC

## 2020-03-20 ENCOUNTER — Ambulatory Visit: Payer: Self-pay | Admitting: Allergy & Immunology

## 2020-03-29 NOTE — Progress Notes (Signed)
Virtual Visit via telephone note  I connected with Tony [name] on 03/30/20 at 11:15 AM EDT by telephone due to failed video and verified that I am speaking with the correct person using two identifiers.   I discussed the limitations of evaluation and management by telemedicine and the availability of in person appointments. The patient expressed understanding and agreed to proceed.  Present for the visit:  Myself, Dr Cheryll Cockayne, Kathi Ludwig.  The patient is currently at home and I am in the office.    No referring provider.    History of Present Illness: He is here for an acute visit for cold symptoms.  His symptoms started several days ago  He is experiencing fatigue, nasal congestion, ear pain, sinus pain, sore throat, mild wheezing, cough that is productive at times of brownish sputum, body aches and headaches.  He has not had any fevers.  He denies shortness of breath.  He does have some tightness in his chest.  He tends to get something like this at least once a year.  He is not used his asthma inhaler.  He is taking his allergy medication.   He tested covid negative yesterday.   Review of Systems  Constitutional: Positive for malaise/fatigue. Negative for fever.  HENT: Positive for congestion, ear pain, sinus pain and sore throat.   Respiratory: Positive for cough, sputum production (brownish in color) and wheezing (mild). Negative for shortness of breath.        Tightness  Cardiovascular: Negative for chest pain.  Musculoskeletal: Positive for myalgias.  Neurological: Positive for headaches.      Social History   Socioeconomic History  . Marital status: Single    Spouse name: Not on file  . Number of children: Not on file  . Years of education: Not on file  . Highest education level: Not on file  Occupational History  . Occupation: RESIDENT Copy: [employer]  Tobacco Use  . Smoking status: Former Smoker    Packs/day: 0.24    Years:  3.00    Pack years: 0.72    Types: Cigarettes    Quit date: 09/22/2008    Years since quitting: 11.5  . Smokeless tobacco: Never Used  Substance and Sexual Activity  . Alcohol use: Yes    Alcohol/week: 0.0 standard drinks    Comment: OCCASSIONAL  . Drug use: No  . Sexual activity: Not on file  Other Topics Concern  . 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 Difficulty of Paying Living Expenses:   Food Insecurity:   . Worried About Programme researcher, broadcasting/film/video in the Last Year:   . Barista in the Last Year:   Transportation Needs:   . Freight forwarder (Medical):   Marland Kitchen Lack of Transportation (Non-Medical):   Physical Activity:   . Days of Exercise per Week:   . Minutes of Exercise per Session:   Stress:   . Feeling of Stress :   Social Connections:   . Frequency of Communication with Friends and Family:   . Frequency of Social Gatherings with Friends and Family:   . Attends Religious Services:   . Active Member of Clubs or Organizations:   . Attends Banker Meetings:   Marland Kitchen Marital Status:  Assessment and Plan:  See Problem List for Assessment and Plan of chronic medical problems.   Follow Up Instructions:    I discussed the assessment and treatment plan with the patient. The patient was provided an opportunity to ask questions and all were answered. The patient agreed with the plan and demonstrated an understanding of the instructions.   The patient was advised to call back or seek an in-person evaluation if the symptoms worsen or if the condition fails to improve as anticipated.  Time spent on telephone call: 10 minutes  Binnie Rail, MD

## 2020-03-30 ENCOUNTER — Other Ambulatory Visit: Payer: Self-pay | Admitting: Internal Medicine

## 2020-03-30 ENCOUNTER — Telehealth (INDEPENDENT_AMBULATORY_CARE_PROVIDER_SITE_OTHER): Payer: BC Managed Care – PPO | Admitting: Internal Medicine

## 2020-03-30 ENCOUNTER — Encounter: Payer: Self-pay | Admitting: Internal Medicine

## 2020-03-30 ENCOUNTER — Other Ambulatory Visit: Payer: Self-pay

## 2020-03-30 ENCOUNTER — Telehealth: Payer: Self-pay | Admitting: Internal Medicine

## 2020-03-30 DIAGNOSIS — J209 Acute bronchitis, unspecified: Secondary | ICD-10-CM | POA: Diagnosis not present

## 2020-03-30 DIAGNOSIS — J4521 Mild intermittent asthma with (acute) exacerbation: Secondary | ICD-10-CM | POA: Diagnosis not present

## 2020-03-30 MED ORDER — LEVOFLOXACIN 500 MG PO TABS: 500.0000 mg | ORAL_TABLET | Freq: Every day | ORAL | 0 refills | Status: DC

## 2020-03-30 MED ORDER — HYDROCODONE-HOMATROPINE 5-1.5 MG/5ML PO SYRP: 5.0000 mL | ORAL_SOLUTION | Freq: Two times a day (BID) | ORAL | 0 refills | Status: DC | PRN

## 2020-03-30 MED ORDER — AMPHETAMINE-DEXTROAMPHET ER 30 MG PO CP24: 30.0000 mg | ORAL_CAPSULE | ORAL | 0 refills | Status: DC

## 2020-03-30 NOTE — Telephone Encounter (Signed)
The following medication was sent to the wrong pharmacy. Patient is requesting medication be sent to :  levofloxacin (LEVAQUIN) 500 MG tablet  [facility] [phone] - [place], [place] - [address] Phone:  [phone]  Fax:  [phone]

## 2020-03-30 NOTE — Progress Notes (Signed)
error 

## 2020-03-30 NOTE — Assessment & Plan Note (Signed)
Acute Likely bacterial and nature Start Levaquin, which she has done well with in the past Hycodan cough syrup He has a mild exacerbation of his asthma We both agree to hold off on steroids for now, but if his symptoms do not improve or worsen we will consider adding steroids Use albuterol Over-the-counter cold medications, allergy medications Rest, fluids Call if no improvement or if symptoms worsen

## 2020-03-30 NOTE — Assessment & Plan Note (Signed)
Acute Mild exacerbation of asthma He has chest tightness, occasional mild wheezing but no shortness of breath Productive cough and other cold symptoms are likely related to bacterial infection Will start antibiotic-he has done well with Levaquin and we will do that again.  Discussed possible side effects Hycodan cough syrup Advised to use albuterol inhaler as needed We will hold off on systemic steroids at this time unless his asthma symptoms worsen

## 2020-03-30 NOTE — Telephone Encounter (Signed)
Sent in to correct pharmacy.

## 2020-04-03 ENCOUNTER — Ambulatory Visit: Payer: BC Managed Care – PPO | Admitting: Family Medicine

## 2020-04-04 ENCOUNTER — Encounter: Payer: Self-pay | Admitting: Internal Medicine

## 2020-04-05 ENCOUNTER — Other Ambulatory Visit: Payer: Self-pay | Admitting: Internal Medicine

## 2020-04-05 DIAGNOSIS — J4521 Mild intermittent asthma with (acute) exacerbation: Secondary | ICD-10-CM

## 2020-04-05 MED ORDER — HYDROCODONE-HOMATROPINE 5-1.5 MG/5ML PO SYRP: 5.0000 mL | ORAL_SOLUTION | Freq: Two times a day (BID) | ORAL | 0 refills | Status: DC | PRN

## 2020-04-05 MED ORDER — PREDNISONE 50 MG PO TABS: ORAL_TABLET | ORAL | 1 refills | Status: AC

## 2020-04-05 NOTE — Telephone Encounter (Signed)
   Patient requesting response to MyChart message Advised patient Dr Lawerance Bach is not in the office.  Please have covering MD to advise

## 2020-04-09 ENCOUNTER — Encounter: Payer: Self-pay | Admitting: Internal Medicine

## 2020-04-09 ENCOUNTER — Other Ambulatory Visit: Payer: Self-pay | Admitting: Internal Medicine

## 2020-04-09 DIAGNOSIS — J4521 Mild intermittent asthma with (acute) exacerbation: Secondary | ICD-10-CM

## 2020-04-11 ENCOUNTER — Other Ambulatory Visit: Payer: Self-pay | Admitting: Internal Medicine

## 2020-04-11 DIAGNOSIS — J4521 Mild intermittent asthma with (acute) exacerbation: Secondary | ICD-10-CM

## 2020-04-11 MED ORDER — HYDROCODONE-HOMATROPINE 5-1.5 MG/5ML PO SYRP: 5.0000 mL | ORAL_SOLUTION | Freq: Two times a day (BID) | ORAL | 0 refills | Status: DC | PRN

## 2020-04-13 ENCOUNTER — Ambulatory Visit: Payer: BC Managed Care – PPO | Admitting: Family Medicine

## 2020-04-13 ENCOUNTER — Encounter: Payer: Self-pay | Admitting: Family Medicine

## 2020-04-13 ENCOUNTER — Ambulatory Visit: Payer: Self-pay

## 2020-04-13 ENCOUNTER — Other Ambulatory Visit: Payer: Self-pay

## 2020-04-13 VITALS — BP 140/90 | HR 76 | Ht 67.0 in | Wt 197.0 lb

## 2020-04-13 DIAGNOSIS — M79642 Pain in left hand: Secondary | ICD-10-CM

## 2020-04-13 DIAGNOSIS — M7552 Bursitis of left shoulder: Secondary | ICD-10-CM

## 2020-04-13 DIAGNOSIS — M65332 Trigger finger, left middle finger: Secondary | ICD-10-CM | POA: Diagnosis not present

## 2020-04-13 DIAGNOSIS — M7551 Bursitis of right shoulder: Secondary | ICD-10-CM

## 2020-04-13 MED ORDER — LEVALBUTEROL TARTRATE 45 MCG/ACT IN AERO: 2.0000 | INHALATION_SPRAY | Freq: Four times a day (QID) | RESPIRATORY_TRACT | 0 refills | Status: DC | PRN

## 2020-04-13 MED ORDER — ALBUTEROL SULFATE (2.5 MG/3ML) 0.083% IN NEBU: 2.5000 mg | INHALATION_SOLUTION | Freq: Four times a day (QID) | RESPIRATORY_TRACT | 12 refills | Status: AC | PRN

## 2020-04-13 NOTE — Assessment & Plan Note (Signed)
Repeat injection given today, will discussed how this can be beneficial.  Patient does have more pain again I do believe that patient would need surgical intervention.  Patient is in agreement with the plan.  Would need referral if this comes back within 3 months.

## 2020-04-13 NOTE — Patient Instructions (Signed)
Injections today See me again 2-3 months

## 2020-04-13 NOTE — Assessment & Plan Note (Signed)
Repeat injection given again today.  Encourage patient to continue to monitor for more of the neck pain.  Hydroxyzine, discussed other medications including the Zanaflex.  Patient has done formal physical therapy and should continue to improve hopefully.  Follow-up with me again 12 weeks

## 2020-04-13 NOTE — Progress Notes (Signed)
Tawana ScaleZach Lashun Ramseyer D.O. [facility] [address] [place] [id number] Phone: [phone] Subjective:   I Ronelle NighKana Thompson am serving as a Neurosurgeonscribe for Dr. Antoine PrimasZachary Mikaella Escalona.  This visit occurred during the SARS-CoV-2 public health emergency.  Safety protocols were in place, including screening questions prior to the visit, additional usage of staff PPE, and extensive cleaning of exam room while observing appropriate contact time as indicated for disinfecting solutions.   I'm seeing this patient by the request  of:  Pincus SanesBurns, Devonte J, MD  CC: Trigger finger, bilateral shoulder, back and neck pain follow-up  NGE:[id number]:Subjective   12/27/2019 Injection given today, discussed bracing at night, discussed avoiding repetitive activity.  Follow-up again in 4 to 8 weeks  04/13/2020 Orland MustardStacy J Kucher is a 44 y.o. male coming in with complaint of left finger and bilateral shoulder pain. Last seen 12/27/2019 for OMT. Everything hurts today. Patient states he would like injection. Patient is wondering if he can prescribe some medications due to Dr. Lawerance BachBurns being out of town.  Known chronic pain in the shoulders bilaterally.  Responds well to injections from time to time.  Denies any fevers or chills, denies any radiation down the arms at the moment.     Past Medical History:  Diagnosis Date  . Allergic rhinitis   . Anxiety    overlap with depression and ADD  . Asthma   . Hepatitis A   . Hypertension   . Migraine   . Migraines   . OSA on CPAP    Sleep study 12/2011: severe, CPAP 10mmHg   Past Surgical History:  Procedure Laterality Date  . LASIK  2005  . WISDOM TOOTH EXTRACTION     Social History   Socioeconomic History  . Marital status: Single    Spouse name: Not on file  . Number of children: Not on file  . Years of education: Not on file  . Highest education level: Not on file  Occupational History  . Occupation: RESIDENT CopyHALL DIRECTOR    Employer: [employer]  Tobacco Use  . Smoking  status: Former Smoker    Packs/day: 0.24    Years: 3.00    Pack years: 0.72    Types: Cigarettes    Quit date: 09/22/2008    Years since quitting: 11.5  . Smokeless tobacco: Never Used  Substance and Sexual Activity  . Alcohol use: Yes    Alcohol/week: 0.0 standard drinks    Comment: OCCASSIONAL  . Drug use: No  . Sexual activity: Not on file  Other Topics Concern  . 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 Difficulty of Paying Living Expenses:   Food Insecurity:   . Worried About Programme researcher, broadcasting/film/videounning Out of Food in the Last Year:   . Baristaan Out of Food in the Last Year:   Transportation Needs:   . Freight forwarderLack of Transportation (Medical):   Marland Kitchen. Lack of Transportation (Non-Medical):   Physical Activity:   . Days of Exercise per Week:   . Minutes of Exercise per Session:   Stress:   . Feeling of Stress :   Social Connections:   . Frequency of Communication with Friends and Family:   . Frequency of Social Gatherings with Friends and Family:   . Attends Religious Services:   . Active Member of Clubs or Organizations:   .  Attends Banker Meetings:   Marland Kitchen Marital Status:    Allergies  Allergen Reactions  . Erythromycin     Pt states med make him sick   Family History  Problem Relation Age of Onset  . Hypertension Mother   . Arthritis Father   . Hypertension Father   . Lung cancer Maternal Grandfather   . Diabetes Other   . Emphysema Paternal Grandfather   . Asthma Paternal Grandfather      Current Outpatient Medications (Cardiovascular):  .  losartan-hydrochlorothiazide (HYZAAR) 50-12.5 MG tablet, TAKE 1 TABLET BY MOUTH DAILY  Current Outpatient Medications (Respiratory):  .  albuterol (PROVENTIL) (2.5 MG/3ML) 0.083% nebulizer solution, Take 3 mLs (2.5 mg total) by nebulization every 6 (six) hours as needed for wheezing or shortness of  breath. .  Azelastine-Fluticasone 137-50 MCG/ACT SUSP, Place 1 spray into the nose 2 (two) times daily. .  chlorpheniramine (CHLOR-TRIMETON) 4 MG tablet, Take 1 tablet (4 mg total) by mouth every 6 (six) hours as needed for allergies. .  fluticasone (FLONASE) 50 MCG/ACT nasal spray, USE TWO SPRAY IN NOSE ONCE DAILY .  HYDROcodone-homatropine (HYCODAN) 5-1.5 MG/5ML syrup, Take 5 mLs by mouth every 12 (twelve) hours as needed for cough. .  levalbuterol (XOPENEX HFA) 45 MCG/ACT inhaler, Inhale 2 puffs into the lungs every 6 (six) hours as needed for wheezing.  Current Outpatient Medications (Analgesics):  .  butalbital-acetaminophen-caffeine (FIORICET, ESGIC) 50-325-40 MG tablet, TAKE 1-2 TABLETS BY MOUTH EVERY 6 HOURS AS NEEDED FOR MIGRAINE .  DUEXIS 800-26.6 MG TABS, TAKE ONE TABLET BY MOUTH THREE TIMES DAILY .  SUMAtriptan (IMITREX) 100 MG tablet, Take 1 tablet (100 mg total) by mouth every 2 (two) hours as needed for migraine. May repeat in 2 hours if headache persists or recurs. .  SUMAtriptan (IMITREX) 6 MG/0.5ML SOLN injection, Inject 0.5 mLs (6 mg total) into the skin every 2 (two) hours as needed for migraine or headache. May repeat in 2 hrs if headache persists   Current Outpatient Medications (Other):  .  acyclovir ointment (ZOVIRAX) 5 %, APPLY TOPICALLY EVERY 3 HOURS .  ALPRAZolam (XANAX) 0.5 MG tablet, TAKE ONE TABLET BY MOUTH TWICE A DAY AS NEEDED FOR ANXIETY .  amphetamine-dextroamphetamine (ADDERALL XR) 30 MG 24 hr capsule, Take 1 capsule (30 mg total) by mouth every morning. .  Diclofenac Sodium (PENNSAID) 2 % SOLN, Place 2 g onto the skin 2 (two) times daily. .  hydrOXYzine (ATARAX/VISTARIL) 10 MG tablet, Take 1 tablet (10 mg total) by mouth 3 (three) times daily as needed. .  Insulin Pen Needle (BD ULTRA-FINE MICRO PEN NEEDLE) 32G X 6 MM MISC, Use with Saxenda .  pantoprazole (PROTONIX) 40 MG tablet, Take 1 tablet (40 mg total) by mouth daily. .  sertraline (ZOLOFT) 100 MG  tablet, Take 1.5 tablets (150 mg total) by mouth daily. Marland Kitchen  tiZANidine (ZANAFLEX) 4 MG tablet, Take 1 tablet (4 mg total) by mouth at bedtime.   Reviewed prior external information including notes and imaging from  primary care provider As well as notes that were available from care everywhere and other healthcare systems.  Past medical history, social, surgical and family history all reviewed in electronic medical record.  No pertanent information unless stated regarding to the chief complaint.   Review of Systems:  No headache, visual changes, nausea, vomiting, diarrhea, constipation, dizziness, abdominal pain, skin rash, fevers, chills, night sweats, weight loss, swollen lymph nodes, body aches, joint swelling, chest pain, shortness of breath, mood changes.  POSITIVE muscle aches  Objective  There were no vitals taken for this visit.   General: No apparent distress alert and oriented x3 mood and affect normal, dressed appropriately.  HEENT: Pupils equal, extraocular movements intact  Respiratory: Patient's speak in full sentences and does not appear short of breath  Cardiovascular: No lower extremity edema, non tender, no erythema  Neuro: Cranial nerves II through XII are intact, neurovascularly intact in all extremities with 2+ DTRs and 2+ pulses.  Gait normal with good balance and coordination.  MSK: Bilateral shoulder exam showed positive impingement.  5-5 strength of rotator cuff noted.  Neck exam does have some mild loss of lordosis.  Tender to palpation.  Patient's left hand shows triggering of the middle finger at the A2 pulley.  Nodule noted.  Neurovascularly intact distally.  Procedure: Real-time Ultrasound Guided Injection of right glenohumeral joint Device: GE Logiq Q7  Ultrasound guided injection is preferred based studies that show increased duration, increased effect, greater accuracy, decreased procedural pain, increased response rate with ultrasound guided versus blind  injection.  Verbal informed consent obtained.  Time-out conducted.  Noted no overlying erythema, induration, or other signs of local infection.  Skin prepped in a sterile fashion.  Local anesthesia: Topical Ethyl chloride.  With sterile technique and under real time ultrasound guidance:  Joint visualized.  23g 1  inch needle inserted posterior approach. Pictures taken for needle placement. Patient did have injection of 2 cc of 1% lidocaine, 2 cc of 0.5% Marcaine, and 1.0 cc of Kenalog 40 mg/dL. Completed without difficulty  Pain immediately resolved suggesting accurate placement of the medication.  Advised to call if fevers/chills, erythema, induration, drainage, or persistent bleeding.  Images permanently stored and available for review in the ultrasound unit.  Impression: Technically successful ultrasound guided injection.  Procedure: Real-time Ultrasound Guided Injection of left glenohumeral joint Device: GE Logiq E  Ultrasound guided injection is preferred based studies that show increased duration, increased effect, greater accuracy, decreased procedural pain, increased response rate with ultrasound guided versus blind injection.  Verbal informed consent obtained.  Time-out conducted.  Noted no overlying erythema, induration, or other signs of local infection.  Skin prepped in a sterile fashion.  Local anesthesia: Topical Ethyl chloride.  With sterile technique and under real time ultrasound guidance:  Joint visualized.  21g 2 inch needle inserted posterior approach. Pictures taken for needle placement. Patient did have injection of 2 cc of 0.5% Marcaine, and 1cc of Kenalog 40 mg/dL. Completed without difficulty  Pain immediately resolved suggesting accurate placement of the medication.  Advised to call if fevers/chills, erythema, induration, drainage, or persistent bleeding.  Images permanently stored and available for review in the ultrasound unit.  Impression: Technically  successful ultrasound guided injection.  Procedure: Real-time Ultrasound Guided Injection of left trigger finger nodule Device: GE Logiq Q7 Ultrasound guided injection is preferred based studies that show increased duration, increased effect, greater accuracy, decreased procedural pain, increased response rate, and decreased cost with ultrasound guided versus blind injection.  Verbal informed consent obtained.  Time-out conducted.  Noted no overlying erythema, induration, or other signs of local infection.  Skin prepped in a sterile fashion.  Local anesthesia: Topical Ethyl chloride.  With sterile technique and under real time ultrasound guidance: With a 25-gauge half inch needle injected into the flexor tendon sheath of the left middle finger.  A total of 0.5 cc of 0.5% Marcaine and 0.5 cc of Kenalog 40 mg/mL used Completed without difficulty  Pain immediately resolved suggesting  accurate placement of the medication.  Advised to call if fevers/chills, erythema, induration, drainage, or persistent bleeding.  Images permanently stored and available for review in the ultrasound unit.  Impression: Technically successful ultrasound guided injection.    Impression and Recommendations:     The above documentation has been reviewed and is accurate and complete Kana Janee Morn       Note: This dictation was prepared with Dragon dictation along with smaller phrase technology. Any transcriptional errors that result from this process are unintentional.

## 2020-04-19 ENCOUNTER — Encounter: Payer: Self-pay | Admitting: Internal Medicine

## 2020-05-03 ENCOUNTER — Ambulatory Visit: Payer: Self-pay | Admitting: Allergy & Immunology

## 2020-05-23 ENCOUNTER — Telehealth (INDEPENDENT_AMBULATORY_CARE_PROVIDER_SITE_OTHER): Payer: BC Managed Care – PPO | Admitting: Internal Medicine

## 2020-05-23 ENCOUNTER — Encounter: Payer: Self-pay | Admitting: Internal Medicine

## 2020-05-23 DIAGNOSIS — J069 Acute upper respiratory infection, unspecified: Secondary | ICD-10-CM

## 2020-05-23 MED ORDER — PANTOPRAZOLE SODIUM 40 MG PO TBEC
40.0000 mg | DELAYED_RELEASE_TABLET | Freq: Every day | ORAL | 1 refills | Status: DC
Start: 2020-05-23 — End: 2020-12-10

## 2020-05-23 MED ORDER — AMPHETAMINE-DEXTROAMPHET ER 30 MG PO CP24
30.0000 mg | ORAL_CAPSULE | ORAL | 0 refills | Status: DC
Start: 2020-05-23 — End: 2020-06-28

## 2020-05-23 MED ORDER — SUMATRIPTAN SUCCINATE 100 MG PO TABS: 100.0000 mg | ORAL_TABLET | ORAL | 5 refills | Status: DC | PRN

## 2020-05-23 MED ORDER — SERTRALINE HCL 100 MG PO TABS: 150.0000 mg | ORAL_TABLET | Freq: Every day | ORAL | 1 refills | Status: DC

## 2020-05-23 NOTE — Assessment & Plan Note (Signed)
Symptoms likely viral in nature covid test neg Continue symptomatic treatment with over-the-counter cold medications, inhalers Tylenol/ibuprofen Increase rest and fluids Note given for work - out this week Call if symptoms worsen or do not improve

## 2020-05-23 NOTE — Progress Notes (Signed)
Virtual Visit via Video Note  I connected with Tony [name] on 05/23/20 at  2:15 PM EDT by a video enabled telemedicine application and verified that I am speaking with the correct person using two identifiers.   I discussed the limitations of evaluation and management by telemedicine and the availability of in person appointments. The patient expressed understanding and agreed to proceed.  Present for the visit:  Myself, Dr Cheryll Cockayne, Kathi Ludwig.  The patient is currently at home and I am in the office.    No referring provider.    History of Present Illness: He is here for an acute visit for cold symptoms.  His symptoms started about one week ago.  He is experiencing slight fever one day, fatigue, congestion, scratchy throat, cough - clear sputum, mild wheeze and headaches.   He has tried taking his inhalers.   He had a neg covid test.   He has had increased stress and has been worn out.  His symptoms have gotten a little better.     Review of Systems  Constitutional: Positive for fever (slight one day) and malaise/fatigue. Negative for chills.  HENT: Positive for congestion. Negative for ear pain, sinus pain and sore throat (scratchy throat).   Respiratory: Positive for cough, sputum production (clear, thicker than normal) and wheezing (used inhaler a couple of times). Negative for shortness of breath.   Neurological: Positive for headaches.      Social History   Socioeconomic History   Marital status: Single    Spouse name: Not on file   Number of children: Not on file   Years of education: Not on file   Highest education level: Not on file  Occupational History   Occupation: RESIDENT HALL DIRECTOR    Employer: [employer]  Tobacco Use   Smoking status: Former Smoker    Packs/day: 0.24    Years: 3.00    Pack years: 0.72    Types: Cigarettes    Quit date: 09/22/2008    Years since quitting: 11.6   Smokeless tobacco: Never Used  Substance and  Sexual Activity   Alcohol use: Yes    Alcohol/week: 0.0 standard drinks    Comment: OCCASSIONAL   Drug use: No   Sexual activity: Not on file  Other Topics Concern   Not on file  Social History Narrative   Single, 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, international aid at [employer]   Social Determinants of Health   Financial Resource Strain:    Difficulty of Paying Living Expenses: Not on file  Food Insecurity:    Worried About Programme researcher, broadcasting/film/video in the Last Year: Not on file   [employer] of Food in the Last Year: Not on file  Transportation Needs:    Lack of Transportation (Medical): Not on file   Lack of Transportation (Non-Medical): Not on file  Physical Activity:    Days of Exercise per Week: Not on file   Minutes of Exercise per Session: Not on file  Stress:    Feeling of Stress : Not on file  Social Connections:    Frequency of Communication with Friends and Family: Not on file   Frequency of Social Gatherings with Friends and Family: Not on file   Attends Religious Services: Not on file   Active Member of Clubs or Organizations: Not on file   Attends Banker Meetings: Not on file   Marital Status: Not on file  Observations/Objective: Appears well in NAD Breathing normally.  Assessment and Plan:  See Problem List for Assessment and Plan of chronic medical problems.   Follow Up Instructions:    I discussed the assessment and treatment plan with the patient. The patient was provided an opportunity to ask questions and all were answered. The patient agreed with the plan and demonstrated an understanding of the instructions.   The patient was advised to call back or seek an in-person evaluation if the symptoms worsen or if the condition fails to improve as anticipated.    Pincus Sanes, MD

## 2020-06-14 ENCOUNTER — Ambulatory Visit: Payer: Self-pay | Admitting: Allergy & Immunology

## 2020-06-14 ENCOUNTER — Telehealth: Payer: Self-pay | Admitting: *Deleted

## 2020-06-14 MED ORDER — SUMATRIPTAN SUCCINATE 100 MG PO TABS: 100.0000 mg | ORAL_TABLET | Freq: Once | ORAL | 5 refills | Status: DC

## 2020-06-14 NOTE — Telephone Encounter (Signed)
Rec'd fax stating rec'd refill for pt Sumatriptan 100 mg tablets take 1 tablet by mouth every 2 hours as needed for migraine. May repeat in 2 hours if headaches persist or recurs. Need to specify the max dose pt can take daily.Marland KitchenRaechel Chute

## 2020-06-25 ENCOUNTER — Other Ambulatory Visit: Payer: Self-pay | Admitting: Family Medicine

## 2020-06-28 ENCOUNTER — Other Ambulatory Visit: Payer: Self-pay | Admitting: Internal Medicine

## 2020-06-28 MED ORDER — AMPHETAMINE-DEXTROAMPHET ER 30 MG PO CP24
30.0000 mg | ORAL_CAPSULE | ORAL | 0 refills | Status: DC
Start: 2020-06-28 — End: 2020-08-06

## 2020-06-28 NOTE — Telephone Encounter (Signed)
Check [place] registry last filled 04/02/2020.Marland KitchenRaechel Chute
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
## 2020-07-09 ENCOUNTER — Other Ambulatory Visit: Payer: Self-pay

## 2020-07-09 ENCOUNTER — Ambulatory Visit (INDEPENDENT_AMBULATORY_CARE_PROVIDER_SITE_OTHER): Payer: BC Managed Care – PPO | Admitting: Internal Medicine

## 2020-07-09 ENCOUNTER — Encounter: Payer: Self-pay | Admitting: Internal Medicine

## 2020-07-09 ENCOUNTER — Ambulatory Visit (INDEPENDENT_AMBULATORY_CARE_PROVIDER_SITE_OTHER)
Admission: RE | Admit: 2020-07-09 | Discharge: 2020-07-09 | Disposition: A | Discharge status: Home / Self Care | Payer: BC Managed Care – PPO | Source: Ambulatory Visit | Attending: Internal Medicine | Admitting: Internal Medicine

## 2020-07-09 VITALS — BP 140/98 | HR 76 | Temp 98.3°F | Ht 67.0 in | Wt 201.0 lb

## 2020-07-09 DIAGNOSIS — M545 Low back pain, unspecified: Secondary | ICD-10-CM

## 2020-07-09 DIAGNOSIS — M542 Cervicalgia: Secondary | ICD-10-CM

## 2020-07-09 DIAGNOSIS — I10 Essential (primary) hypertension: Secondary | ICD-10-CM

## 2020-07-09 DIAGNOSIS — M549 Dorsalgia, unspecified: Secondary | ICD-10-CM

## 2020-07-09 MED ORDER — LOSARTAN POTASSIUM-HCTZ 100-25 MG PO TABS: 1.0000 | ORAL_TABLET | Freq: Every day | ORAL | 3 refills | Status: DC
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
## 2020-07-09 NOTE — Assessment & Plan Note (Signed)
Chronic Blood pressure is not well controlled Discussed the importance of weight loss Increase losartan-hydrochlorothiazide to 100-25 mg daily

## 2020-07-09 NOTE — Patient Instructions (Addendum)
  X-rays were ordered.     Medications reviewed and updated.  Changes include :   Increase losartan-hctz to 100-25 mg.    Your prescription(s) have been submitted to your pharmacy. Please take as directed and contact our office if you believe you are having problem(s) with the medication(s).
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
## 2020-07-12 ENCOUNTER — Encounter: Payer: Self-pay | Admitting: Family Medicine

## 2020-07-12 ENCOUNTER — Ambulatory Visit (INDEPENDENT_AMBULATORY_CARE_PROVIDER_SITE_OTHER): Payer: BC Managed Care – PPO | Admitting: Family Medicine

## 2020-07-12 ENCOUNTER — Other Ambulatory Visit: Payer: Self-pay

## 2020-07-12 VITALS — BP 128/92 | HR 63 | Ht 67.0 in | Wt 198.0 lb

## 2020-07-12 DIAGNOSIS — M545 Low back pain, unspecified: Secondary | ICD-10-CM | POA: Diagnosis not present

## 2020-07-12 DIAGNOSIS — S134XXA Sprain of ligaments of cervical spine, initial encounter: Secondary | ICD-10-CM

## 2020-07-12 MED ORDER — PREDNISONE 50 MG PO TABS: 50.0000 mg | ORAL_TABLET | Freq: Every day | ORAL | 0 refills | Status: DC

## 2020-07-12 NOTE — Patient Instructions (Signed)
Great to see you  PT [address]. They will call you  Prednisone daily for 5 days. Stop the duexis while you are on it OK to take the flexeril at night See me again in 4 weeks
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
## 2020-07-14 ENCOUNTER — Encounter: Payer: Self-pay | Admitting: Family Medicine
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
## 2020-07-20 ENCOUNTER — Encounter: Payer: Self-pay | Admitting: Family Medicine

## 2020-07-25 ENCOUNTER — Other Ambulatory Visit: Payer: Self-pay

## 2020-07-25 ENCOUNTER — Encounter: Payer: Self-pay | Admitting: Family Medicine

## 2020-07-25 ENCOUNTER — Ambulatory Visit (INDEPENDENT_AMBULATORY_CARE_PROVIDER_SITE_OTHER): Payer: BC Managed Care – PPO | Admitting: Family Medicine

## 2020-07-25 VITALS — BP 120/92 | HR 68 | Ht 67.0 in | Wt 208.0 lb

## 2020-07-25 DIAGNOSIS — M542 Cervicalgia: Secondary | ICD-10-CM

## 2020-07-25 DIAGNOSIS — M999 Biomechanical lesion, unspecified: Secondary | ICD-10-CM | POA: Diagnosis not present

## 2020-07-25 MED ORDER — MELOXICAM 7.5 MG PO TABS: 7.5000 mg | ORAL_TABLET | Freq: Every day | ORAL | 0 refills | Status: DC

## 2020-07-25 MED ORDER — CYCLOBENZAPRINE HCL 10 MG PO TABS
10.0000 mg | ORAL_TABLET | Freq: Three times a day (TID) | ORAL | 3 refills | Status: DC
Start: 2020-07-25 — End: 2021-04-26

## 2020-07-25 NOTE — Patient Instructions (Addendum)
PT will be good  Meloxicam 7.5 daily for 10 days then as needed Do not use NSAIDS such as Advil or Aleve when taking Meloxicam It is ok to use Tylenol for additional pain relief  Stop duexis Continue exercising See me again in 4-5 weeks
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
## 2020-07-25 NOTE — Assessment & Plan Note (Signed)
   Decision today to treat with OMT was based on Physical Exam  After verbal consent patient was treated with HVLA, ME, FPR techniques in cervical, thoracic, rib,areas, all areas are chronic   Patient tolerated the procedure well with improvement in symptoms  Patient given exercises, stretches and lifestyle modifications  See medications in patient instructions if given  Patient will follow up in 4-8 weeks 
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
## 2020-08-06 ENCOUNTER — Other Ambulatory Visit: Payer: Self-pay | Admitting: Internal Medicine

## 2020-08-07 ENCOUNTER — Ambulatory Visit: Payer: BC Managed Care – PPO | Attending: Family Medicine

## 2020-08-07 ENCOUNTER — Other Ambulatory Visit: Payer: Self-pay

## 2020-08-07 DIAGNOSIS — M542 Cervicalgia: Secondary | ICD-10-CM | POA: Insufficient documentation

## 2020-08-07 DIAGNOSIS — M62830 Muscle spasm of back: Secondary | ICD-10-CM | POA: Insufficient documentation

## 2020-08-07 DIAGNOSIS — M545 Low back pain, unspecified: Secondary | ICD-10-CM | POA: Diagnosis present

## 2020-08-07 DIAGNOSIS — R293 Abnormal posture: Secondary | ICD-10-CM | POA: Diagnosis present

## 2020-08-07 MED ORDER — AMPHETAMINE-DEXTROAMPHET ER 30 MG PO CP24
30.0000 mg | ORAL_CAPSULE | ORAL | 0 refills | Status: DC
Start: 2020-08-07 — End: 2020-09-12
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
## 2020-08-07 NOTE — Patient Instructions (Signed)
LTR  10 rps 5 sec hold. Knee to chest  RT. Lt 3 reps 15 sec hold, PPT 10 reps hold 5 sec 1-2x/day Cervical retraction and shoulder retraction x 3-5 reps 3-5 sec hold every 1-2 hrs.

## 2020-08-21 ENCOUNTER — Other Ambulatory Visit: Payer: Self-pay | Admitting: Family Medicine

## 2020-08-23 ENCOUNTER — Ambulatory Visit: Payer: BC Managed Care – PPO | Attending: Family Medicine | Admitting: Physical Therapy

## 2020-08-23 ENCOUNTER — Ambulatory Visit: Payer: BC Managed Care – PPO | Attending: Family

## 2020-08-23 ENCOUNTER — Other Ambulatory Visit: Payer: Self-pay

## 2020-08-23 ENCOUNTER — Ambulatory Visit: Payer: Self-pay | Admitting: Allergy & Immunology

## 2020-08-23 DIAGNOSIS — M62830 Muscle spasm of back: Secondary | ICD-10-CM | POA: Insufficient documentation

## 2020-08-23 DIAGNOSIS — M542 Cervicalgia: Secondary | ICD-10-CM | POA: Diagnosis present

## 2020-08-23 DIAGNOSIS — R293 Abnormal posture: Secondary | ICD-10-CM | POA: Diagnosis present

## 2020-08-23 DIAGNOSIS — Z23 Encounter for immunization: Secondary | ICD-10-CM

## 2020-08-23 DIAGNOSIS — M545 Low back pain, unspecified: Secondary | ICD-10-CM | POA: Diagnosis present

## 2020-08-24 ENCOUNTER — Encounter: Payer: Self-pay | Admitting: Physical Therapy

## 2020-08-24 NOTE — Therapy (Signed)
[facility] Outpatient Rehabilitation [facility] [address] [place], [place], [address] Phone: [phone]   Fax:  [phone]  Physical Therapy Treatment  Patient Details  Name: Tony [name] MRN: [id number] Date of Birth: July 20, 1976 Referring Provider (PT): Antoine Primas, DO   Encounter Date: 08/23/2020   PT End of Session - 08/24/20 0758    Visit Number 2    Number of Visits 12    Date for PT Re-Evaluation 09/14/20    Authorization Type BCBS    PT Start Time 0930    PT Stop Time 1012    PT Time Calculation (min) 42 min    Activity Tolerance Patient tolerated treatment well    Behavior During Therapy [facility] for tasks assessed/performed           Past Medical History:  Diagnosis Date  . Allergic rhinitis   . Anxiety    overlap with depression and ADD  . Asthma   . Hepatitis A   . Hypertension   . Migraine   . Migraines   . OSA on CPAP    Sleep study 12/2011: severe, CPAP    Past Surgical History:  Procedure Laterality Date  . LASIK  2005  . WISDOM TOOTH EXTRACTION      There were no vitals filed for this visit.   Subjective Assessment - 08/24/20 0755    Subjective Patient reports his neck is about the same. He continues to have pain. He is interested in dry needling.    Pertinent History He reports Hx of  shoulder injections.    Limitations Walking;House hold activities    How long can you sit comfortably? Not sure    How long can you walk comfortably? 1/4 mile.   Previously walk 2 miles    Currently in Pain? Yes    Pain Score 5     Pain Location Neck    Pain Orientation Right;Left    Pain Descriptors / Indicators Aching    Pain Type Chronic pain    Pain Onset More than a month ago    Pain Frequency Constant    Aggravating Factors  sitting ata desk    Pain Relieving Factors meds                             [facility] Adult PT Treatment/Exercise - 08/24/20 0001      Self-Care   Self-Care Other Self-Care Comments     Other Self-Care Comments  reviewed use of the thera-cane andtennis ball for self triggerpoint release. Reviewed where to buy thera-cane       Neck Exercises: Seated   Other Seated Exercise scap retraction x15 with cuing to keep his shoulders down       Neck Exercises: Stretches   Upper Trapezius Stretch Limitations 3x20 sec bilateral     Levator Stretch Limitations 3x20 sec bilateral                   PT Education - 08/24/20 0757    Education Details reviewed HEP and symptom mangement; benefits and risk of TPDN    Person(s) Educated Patient    Methods Explanation;Demonstration;Tactile cues;Verbal cues    Comprehension Verbalized understanding;Returned demonstration;Verbal cues required;Tactile cues required            PT Short Term Goals - 08/07/20 1550      PT SHORT TERM GOAL #1   Title He will be independent with initial HEP  Time 3    Period Weeks    Status New      PT SHORT TERM GOAL #2   Title He will report pain in neck and back decr 25% or more    Time 3    Period Weeks    Status New      PT SHORT TERM GOAL #3   Title neck rotation with improve to 55 degrees bilaterally    Time 3    Period Weeks    Status New      PT SHORT TERM GOAL #4   Title FOTO score will improve 5-10 points for neck    Time 3    Period Weeks    Status New      PT SHORT TERM GOAL #5   Title He will repor t 25% less 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    Time 3    Period Weeks    Status New             PT Long Term Goals - 08/07/20 1553      PT LONG TERM GOAL #1   Title He will be independent with all HEP issued    Time 6    Period Weeks    Status New      PT LONG TERM GOAL #2   Title FOTO score improved to 61% for neck    Time 6    Period Weeks    Status New      PT LONG TERM GOAL #3   Title He will report able to sleep through the night due to decreased pain.    Time 6    Period Weeks    Status New      PT LONG TERM GOAL #4   Title Hew will be  able to lift 15 # ne arm with  min to no pain to carry groceries into home .    Time 6    Period Weeks    Status New      PT LONG TERM GOAL #5   Title He will repor tno issues with walking stairs at work    Time 6    Period Weeks    Status New      Additional Long Term Goals   Additional Long Term Goals Yes      PT LONG TERM GOAL #6   Title Hw will be able to rise from chair with no pain    Time 6    Period Weeks    Status New                 Plan - 08/24/20 0802    Clinical Impression Statement Patient had a great twitch respose to dry needling.in both sides. Therapy focused on bilateral upper traps. He reports some pain in his occipitals as well but therapy trialed needling in the traps. We can progress to the occipitals as tolerated. Therapy reviewed light stretching and retraction.    Personal Factors and Comorbidities Comorbidity 1    Comorbidities previous shoulder issues    Examination-Activity Limitations Locomotion Level;Sit;Carry;Sleep;Stairs;Lift;Stand    Examination-Participation Restrictions Community Activity;Occupation;Cleaning;Yard Work;Laundry;Shop    Stability/Clinical Decision Making Evolving/Moderate complexity    Clinical Decision Making Moderate    Rehab Potential Good    PT Frequency 2x / week    PT Duration 6 weeks    PT Treatment/Interventions Taping;Dry needling;Manual techniques;Patient/family education;Therapeutic activities;Therapeutic exercise;Electrical Stimulation;Iontophoresis 4mg /ml Dexamethasone;Moist Heat;Functional mobility training;Passive range of motion  PT Next Visit Plan modalities and manual for pain. REvie and progress HEP    PT Home Exercise Plan PPT , LTR, SKC, cervical and scapula retraction    Consulted and Agree with Plan of Care Patient           Patient will benefit from skilled therapeutic intervention in order to improve the following deficits and impairments:  Pain, Postural dysfunction, Decreased strength,  Decreased activity tolerance, Decreased range of motion, Difficulty walking, Increased muscle spasms  Visit Diagnosis: Cervicalgia  Acute bilateral low back pain without sciatica  Muscle spasm of back  Abnormal posture     Problem List Patient Active Problem List   Diagnosis Date Noted  . Mid back pain 07/09/2020  . Neck pain 07/09/2020  . Acute bilateral low back pain without sciatica 07/09/2020  . Trigger finger, left middle finger 12/27/2019  . Lack of motivation 12/09/2019  . Urinary symptom or sign 12/09/2019  . URI (upper respiratory infection) 06/22/2019  . Cough 12/02/2018  . Bilateral shoulder bursitis 10/20/2018  . Prediabetes 06/03/2018  . Hepatitis, acute 07/24/2017  . Acute bronchitis 09/29/2016  . Mild intermittent asthma with acute exacerbation 09/29/2016  . Esophageal reflux 06/17/2016  . Nonallopathic lesion of cervical region 04/02/2016  . Nonallopathic lesion of thoracic region 04/02/2016  . Nonallopathic lesion-rib cage 04/02/2016  . Pain in right acromioclavicular joint 02/21/2016  . Chronic upper back pain 12/18/2015  . Depression 05/31/2015  . Overweight   . Asthma, mild intermittent 03/31/2013  . Migraine   . Obstructive sleep apnea   . Generalized anxiety disorder   . Seasonal and perennial allergic rhinitis   . Essential hypertension   . ADD (attention deficit disorder)     Dessie Coma PT DPT  08/24/2020, 8:21 AM  [facility] [address] [place], [place], [address] Phone: [phone]   Fax:  [phone]  Name: Tony [name] MRN: [id number] Date of Birth: 1975/10/05

## 2020-08-29 ENCOUNTER — Other Ambulatory Visit: Payer: Self-pay

## 2020-08-29 ENCOUNTER — Encounter: Payer: Self-pay | Admitting: Physical Therapy

## 2020-08-29 ENCOUNTER — Ambulatory Visit: Payer: BC Managed Care – PPO | Admitting: Physical Therapy

## 2020-08-29 DIAGNOSIS — M542 Cervicalgia: Secondary | ICD-10-CM

## 2020-08-29 DIAGNOSIS — M545 Low back pain, unspecified: Secondary | ICD-10-CM

## 2020-08-29 DIAGNOSIS — R293 Abnormal posture: Secondary | ICD-10-CM

## 2020-08-29 DIAGNOSIS — M62830 Muscle spasm of back: Secondary | ICD-10-CM

## 2020-08-29 NOTE — Therapy (Signed)
[facility] Outpatient Rehabilitation [facility] [address] [place], [place], [address] Phone: [phone]   Fax:  [phone]  Physical Therapy Treatment  Patient Details  Name: Tony [name] MRN: [id number] Date of Birth: [birth year] Referring Provider (PT): Antoine Primas, DO   Encounter Date: 08/29/2020   PT End of Session - 08/29/20 1508    Visit Number 3    Number of Visits 12    Date for PT Re-Evaluation 09/14/20    Authorization Type BCBS    PT Start Time 1508    PT Stop Time 1546    PT Time Calculation (min) 38 min    Activity Tolerance Patient tolerated treatment well    Behavior During Therapy [facility] for tasks assessed/performed           Past Medical History:  Diagnosis Date  . Allergic rhinitis   . Anxiety    overlap with depression and ADD  . Asthma   . Hepatitis A   . Hypertension   . Migraine   . Migraines   . OSA on CPAP    Sleep study 12/2011: severe, CPAP    Past Surgical History:  Procedure Laterality Date  . LASIK  2005  . WISDOM TOOTH EXTRACTION      There were no vitals filed for this visit.   Subjective Assessment - 08/29/20 1510    Subjective "The Dn last session helped. I am still having pain in the side of the neck"    Patient Stated Goals He wants to not hurt any more    Currently in Pain? Yes    Pain Score 4     Pain Location Neck    Pain Orientation Right;Left    Aggravating Factors  N/A    Pain Relieving Factors N/A              [facility] PT Assessment - 08/29/20 0001      Assessment   Medical Diagnosis neck pain    Referring Provider (PT) Antoine Primas, DO                         [facility] Adult PT Treatment/Exercise - 08/29/20 0001      Lumbar Exercises: Seated   Other Seated Lumbar Exercises anterior pelvic tilt 1 x 10 holding 5 seconds on dyna disc   seated on dyna disc     Shoulder Exercises: Seated   Other Seated Exercises double ER with scap retraction 1 x 10   seated on dyna  disc with anterior pelvic tilt     Manual Therapy   Manual Therapy Joint mobilization;Soft tissue mobilization;Other (comment)    Manual therapy comments skilled palpation and monitoring during TPDN    Joint Mobilization T1-T7 PA grade III-IV, R rib mobs inferior mobs    Soft tissue mobilization IASTM along L upper trap/ levator scapulae and sub-occipitals    Other Manual Therapy L upper trap inhibition taping      Neck Exercises: Stretches   Upper Trapezius Stretch 1 rep;Left    Levator Stretch 1 rep;30 seconds;Left            Trigger Point Dry Needling - 08/29/20 0001    Consent Given? Yes    Education Handout Provided Previously provided    Muscles Treated Head and Neck Upper trapezius;Levator scapulae;Suboccipitals    Suboccipitals Response Twitch response elicited;Palpable increased muscle length    Levator Scapulae Response Twitch response elicited;Palpable increased muscle length  PT Education - 08/29/20 1619    Education Details reviewed HEP and updated today.    Person(s) Educated Patient    Methods Explanation;Verbal cues    Comprehension Verbalized understanding;Verbal cues required            PT Short Term Goals - 08/07/20 1550      PT SHORT TERM GOAL #1   Title He will be independent with initial HEP    Time 3    Period Weeks    Status New      PT SHORT TERM GOAL #2   Title He will report pain in neck and back decr 25% or more    Time 3    Period Weeks    Status New      PT SHORT TERM GOAL #3   Title neck rotation with improve to 55 degrees bilaterally    Time 3    Period Weeks    Status New      PT SHORT TERM GOAL #4   Title FOTO score will improve 5-10 points for neck    Time 3    Period Weeks    Status New      PT SHORT TERM GOAL #5   Title He will repor t 25% less 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    Time 3    Period Weeks    Status New             PT Long Term Goals - 08/07/20 1553      PT LONG TERM  GOAL #1   Title He will be independent with all HEP issued    Time 6    Period Weeks    Status New      PT LONG TERM GOAL #2   Title FOTO score improved to 61% for neck    Time 6    Period Weeks    Status New      PT LONG TERM GOAL #3   Title He will report able to sleep through the night due to decreased pain.    Time 6    Period Weeks    Status New      PT LONG TERM GOAL #4   Title Hew will be able to lift 15 # ne arm with  min to no pain to carry groceries into home .    Time 6    Period Weeks    Status New      PT LONG TERM GOAL #5   Title He will repor tno issues with walking stairs at work    Time 6    Period Weeks    Status New      Additional Long Term Goals   Additional Long Term Goals Yes      PT LONG TERM GOAL #6   Title Hw will be able to rise from chair with no pain    Time 6    Period Weeks    Status New                 Plan - 08/29/20 1612    Clinical Impression Statement pt reported relief following last session. continued TPDN focusing on L upper trap, and sub-occipitals followed with IASTM Techniques. educated proper posture in sitting utilzing anterior pelvic tilt and posterior shoulder strengthening which he did well with. Trialing inhibition taping to calm down upper trap activation which he noted relief of tension.    PT Treatment/Interventions Taping;Dry  needling;Manual techniques;Patient/family education;Therapeutic activities;Therapeutic exercise;Electrical Stimulation;Iontophoresis 4mg /ml Dexamethasone;Moist Heat;Functional mobility training;Passive range of motion    PT Next Visit Plan modalities and manual for pain. REvie and progress HEP    PT Home Exercise Plan PPT , LTR, SKC, cervical and scapula retraction    Consulted and Agree with Plan of Care Patient           Patient will benefit from skilled therapeutic intervention in order to improve the following deficits and impairments:  Pain, Postural dysfunction, Decreased  strength, Decreased activity tolerance, Decreased range of motion, Difficulty walking, Increased muscle spasms  Visit Diagnosis: Cervicalgia  Acute bilateral low back pain without sciatica  Abnormal posture  Muscle spasm of back     Problem List Patient Active Problem List   Diagnosis Date Noted  . Mid back pain 07/09/2020  . Neck pain 07/09/2020  . Acute bilateral low back pain without sciatica 07/09/2020  . Trigger finger, left middle finger 12/27/2019  . Lack of motivation 12/09/2019  . Urinary symptom or sign 12/09/2019  . URI (upper respiratory infection) 06/22/2019  . Cough 12/02/2018  . Bilateral shoulder bursitis 10/20/2018  . Prediabetes 06/03/2018  . Hepatitis, acute 07/24/2017  . Acute bronchitis 09/29/2016  . Mild intermittent asthma with acute exacerbation 09/29/2016  . Esophageal reflux 06/17/2016  . Nonallopathic lesion of cervical region 04/02/2016  . Nonallopathic lesion of thoracic region 04/02/2016  . Nonallopathic lesion-rib cage 04/02/2016  . Pain in right acromioclavicular joint 02/21/2016  . Chronic upper back pain 12/18/2015  . Depression 05/31/2015  . Overweight   . Asthma, mild intermittent 03/31/2013  . Migraine   . Obstructive sleep apnea   . Generalized anxiety disorder   . Seasonal and perennial allergic rhinitis   . Essential hypertension   . ADD (attention deficit disorder)     06/01/2013 PT, DPT, LAT, ATC  08/29/20  4:22 PM      [facility] Out[facility] [address] [place], [place], [place] Phone: [phone]   Fax:  [phone]  Name: GURLEY CLIMER MRN: [name] Date of Birth: October 22, 1975

## 2020-08-31 ENCOUNTER — Ambulatory Visit: Payer: BC Managed Care – PPO | Admitting: Physical Therapy

## 2020-08-31 ENCOUNTER — Telehealth: Payer: Self-pay | Admitting: Physical Therapy

## 2020-08-31 NOTE — Telephone Encounter (Signed)
Contacted patient regarding no-show appointment. Patient advised his next appointment is not until Jan 6th. He was asked that if he thinks he will miss it to please call otherwise we will see him on the 6th.

## 2020-09-12 ENCOUNTER — Other Ambulatory Visit: Payer: Self-pay | Admitting: Internal Medicine

## 2020-09-12 MED ORDER — AMPHETAMINE-DEXTROAMPHET ER 30 MG PO CP24
30.0000 mg | ORAL_CAPSULE | ORAL | 0 refills | Status: DC
Start: 2020-09-12 — End: 2020-10-29

## 2020-09-17 ENCOUNTER — Other Ambulatory Visit: Payer: Self-pay | Admitting: Family Medicine

## 2020-09-27 ENCOUNTER — Ambulatory Visit: Payer: BC Managed Care – PPO | Attending: Family Medicine | Admitting: Physical Therapy

## 2020-09-27 ENCOUNTER — Other Ambulatory Visit: Payer: Self-pay

## 2020-09-27 ENCOUNTER — Encounter: Payer: Self-pay | Admitting: Physical Therapy

## 2020-09-27 DIAGNOSIS — M62830 Muscle spasm of back: Secondary | ICD-10-CM | POA: Insufficient documentation

## 2020-09-27 DIAGNOSIS — M542 Cervicalgia: Secondary | ICD-10-CM | POA: Diagnosis present

## 2020-09-27 DIAGNOSIS — M545 Low back pain, unspecified: Secondary | ICD-10-CM | POA: Insufficient documentation

## 2020-09-27 DIAGNOSIS — R293 Abnormal posture: Secondary | ICD-10-CM | POA: Diagnosis present

## 2020-09-27 NOTE — Therapy (Signed)
[facility] Outpatient Rehabilitation [facility] [address] [place], [place], [address] Phone: [phone]   Fax:  [phone]  Physical Therapy Treatment  Patient Details  Name: Tony [name] MRN: [id number] Date of Birth: 07/28/1976 Referring Provider (PT): Antoine Primas, DO   Encounter Date: 09/27/2020   PT End of Session - 09/27/20 1021    Visit Number 4    Number of Visits 12    Date for PT Re-Evaluation 09/14/20    Authorization Type BCBS    PT Start Time 1015    PT Stop Time 1058    PT Time Calculation (min) 43 min    Activity Tolerance Patient tolerated treatment well    Behavior During Therapy [facility] for tasks assessed/performed           Past Medical History:  Diagnosis Date  . Allergic rhinitis   . Anxiety    overlap with depression and ADD  . Asthma   . Hepatitis A   . Hypertension   . Migraine   . Migraines   . OSA on CPAP    Sleep study 12/2011: severe, CPAP    Past Surgical History:  Procedure Laterality Date  . LASIK  2005  . WISDOM TOOTH EXTRACTION      There were no vitals filed for this visit.   Subjective Assessment - 09/27/20 1020    Subjective Patient reports the dry needling ins helping. He continues to have paoin. He has been on vacation for about a month. He has not had much time to work on his exercises.    Pertinent History He reports Hx of  shoulder injections.    Limitations Walking;House hold activities    How long can you sit comfortably? Not sure    How long can you walk comfortably? 1/4 mile.   Previously walk 2 miles    Diagnostic tests Xray: Negative    Patient Stated Goals He wants to not hurt any more    Currently in Pain? Yes    Pain Score 5     Pain Location Neck    Pain Orientation Right;Left    Pain Descriptors / Indicators Aching    Pain Type Chronic pain    Pain Onset More than a month ago    Pain Frequency Constant    Aggravating Factors  sitting at the computer / reaching overhead     Pain Relieving Factors nothing    Effect of Pain on Daily Activities pain with activity    Multiple Pain Sites Yes              [facility] PT Assessment - 09/27/20 0001      AROM   Overall AROM Comments painwith activie movement of the shoulders. past 90 degrees bilateral.    Cervical Flexion 50    Cervical Extension 40    Cervical - Right Rotation 70    Cervical - Left Rotation 53                         [facility] Adult PT Treatment/Exercise - 09/27/20 0001      Neck Exercises: Standing   Other Standing Exercises shoulder extension x20 red; row x20 red      Manual Therapy   Manual Therapy Joint mobilization;Soft tissue mobilization;Other (comment)    Manual therapy comments skilled palpation and monitoring during TPDN    Soft tissue mobilization IASTM along L upper trap/ levator scapulae and sub-occipitals  Neck Exercises: Stretches   Upper Trapezius Stretch Limitations 1x20 sec bilateral    Levator Stretch Limitations 1x20 sec bilateral            Trigger Point Dry Needling - 09/27/20 0001    Consent Given? Yes    Education Handout Provided Previously provided    Muscles Treated Head and Neck Upper trapezius;Levator scapulae;Suboccipitals    Upper Trapezius Response Twitch reponse elicited                PT Education - 09/27/20 1341    Education Details updated HEP for core strengthening    Person(s) Educated Patient    Methods Explanation;Tactile cues;Demonstration;Verbal cues    Comprehension Verbalized understanding;Returned demonstration;Verbal cues required;Tactile cues required            PT Short Term Goals - 09/27/20 1345      PT SHORT TERM GOAL #1   Title He will be independent with initial HEP    Time 3    Period Weeks    Status On-going      PT SHORT TERM GOAL #2   Title He will report pain in neck and back decr 25% or more    Time 3    Period Weeks    Status On-going      PT SHORT TERM GOAL #3   Title neck rotation  with improve to 55 degrees bilaterally    Time 3    Period Weeks    Status On-going      PT SHORT TERM GOAL #4   Title FOTO score will improve 5-10 points for neck    Time 3    Period Weeks    Status On-going      PT SHORT TERM GOAL #5   Title He will repor t 25% less 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    Time 3    Period Weeks    Status On-going             PT Long Term Goals - 08/07/20 1553      PT LONG TERM GOAL #1   Title He will be independent with all HEP issued    Time 6    Period Weeks    Status New      PT LONG TERM GOAL #2   Title FOTO score improved to 61% for neck    Time 6    Period Weeks    Status New      PT LONG TERM GOAL #3   Title He will report able to sleep through the night due to decreased pain.    Time 6    Period Weeks    Status New      PT LONG TERM GOAL #4   Title Hew will be able to lift 15 # ne arm with  min to no pain to carry groceries into home .    Time 6    Period Weeks    Status New      PT LONG TERM GOAL #5   Title He will repor tno issues with walking stairs at work    Time 6    Period Weeks    Status New      Additional Long Term Goals   Additional Long Term Goals Yes      PT LONG TERM GOAL #6   Title Hw will be able to rise from chair with no pain    Time 6  Period Weeks    Status New                 Plan - 09/27/20 1342    Clinical Impression Statement Patient had a great twitch respose to needling of his upper traps. Overall his right rotation has improved significantly. he is still having pain with cervical extension and flexion. He has pain with active range of both shoulders. He feels like the needling and stretches have helped, but [place] has [place] out of the [place] 1 [place]. He would benefit from consitent therapy for 4 more weeks to continue to reduce spasming and improve ability to [employer] and perfrom ADL's    Personal Factors and Comorbidities Comorbidity 1    Comorbidities previous shoulder  issues    Examination-Activity Limitations Locomotion Level;Sit;Carry;Sleep;Stairs;Lift;Stand    Examination-Participation Restrictions Community Activity;Occupation;Cleaning;Yard Work;Laundry;Shop    Stability/Clinical Decision Making Evolving/Moderate complexity    Clinical Decision Making Moderate    Rehab Potential Good    PT Frequency 1x / week    PT Duration 6 weeks    PT Treatment/Interventions Taping;Dry needling;Manual techniques;Patient/family education;Therapeutic activities;Therapeutic exercise;Electrical Stimulation;Iontophoresis 4mg /ml Dexamethasone;Moist Heat;Functional mobility training;Passive range of motion    PT Next Visit Plan modalities and manual for pain. REvie and progress HEP    PT Home Exercise Plan PPT , LTR, SKC, cervical and scapula retraction    Consulted and Agree with Plan of Care Patient           Patient will benefit from skilled therapeutic intervention in order to improve the following deficits and impairments:  Pain,Postural dysfunction,Decreased strength,Decreased activity tolerance,Decreased range of motion,Difficulty walking,Increased muscle spasms  Visit Diagnosis: Cervicalgia  Acute bilateral low back pain without sciatica  Abnormal posture  Muscle spasm of back     Problem List Patient Active Problem List   Diagnosis Date Noted  . Mid back pain 07/09/2020  . Neck pain 07/09/2020  . Acute bilateral low back pain without sciatica 07/09/2020  . Trigger finger, left middle finger 12/27/2019  . Lack of motivation 12/09/2019  . Urinary symptom or sign 12/09/2019  . URI (upper respiratory infection) 06/22/2019  . Cough 12/02/2018  . Bilateral shoulder bursitis 10/20/2018  . Prediabetes 06/03/2018  . Hepatitis, acute 07/24/2017  . Acute bronchitis 09/29/2016  . Mild intermittent asthma with acute exacerbation 09/29/2016  . Esophageal reflux 06/17/2016  . Nonallopathic lesion of cervical region 04/02/2016  . Nonallopathic lesion of  thoracic region 04/02/2016  . Nonallopathic lesion-rib cage 04/02/2016  . Pain in right acromioclavicular joint 02/21/2016  . Chronic upper back pain 12/18/2015  . Depression 05/31/2015  . Overweight   . Asthma, mild intermittent 03/31/2013  . Migraine   . Obstructive sleep apnea   . Generalized anxiety disorder   . Seasonal and perennial allergic rhinitis   . Essential hypertension   . ADD (attention deficit disorder)     Carney Living PT DPT  09/27/2020, 1:50 PM  [facility] [address] [place], [place], [address] Phone: [phone]   Fax:  [phone]  Name: Tony [name] MRN: [id number] Date of Birth: 1976-08-07

## 2020-10-17 ENCOUNTER — Other Ambulatory Visit: Payer: Self-pay

## 2020-10-17 ENCOUNTER — Ambulatory Visit: Payer: BC Managed Care – PPO | Admitting: Physical Therapy

## 2020-10-18 ENCOUNTER — Ambulatory Visit (INDEPENDENT_AMBULATORY_CARE_PROVIDER_SITE_OTHER): Payer: BC Managed Care – PPO | Admitting: Allergy & Immunology

## 2020-10-18 ENCOUNTER — Encounter: Payer: Self-pay | Admitting: Allergy & Immunology

## 2020-10-18 VITALS — BP 134/78 | HR 109 | Temp 97.7°F | Resp 14 | Ht 65.5 in | Wt 199.4 lb

## 2020-10-18 DIAGNOSIS — J3089 Other allergic rhinitis: Secondary | ICD-10-CM

## 2020-10-18 DIAGNOSIS — J302 Other seasonal allergic rhinitis: Secondary | ICD-10-CM

## 2020-10-18 DIAGNOSIS — J4520 Mild intermittent asthma, uncomplicated: Secondary | ICD-10-CM | POA: Diagnosis not present

## 2020-10-18 NOTE — Progress Notes (Signed)
NEW PATIENT  Date of Service/Encounter:  10/18/20  Referring provider: Pincus Sanes, MD   Assessment:   Seasonal and perennial allergic rhinitis (grasses, ragweed, indoor molds, outdoor molds, dust mites, cat, dog and cockroach and horse)  Mild intermittent asthma, uncomplicated  Complicated past medical history, including anxiety/depression as well as chronic back pain and hypertension  Plan/Recommendations:   1. Seasonal and perennial allergic rhinitis - Testing today showed: grasses, ragweed, indoor molds, outdoor molds, dust mites, cat, dog and cockroach and horse - Copy of test results provided.  - Avoidance measures provided. - Continue with: Claritin (loratadine) 10mg  tablet once daily and Benadryl 25mg  as needed - You can use an extra dose of the antihistamine, if needed, for breakthrough symptoms.  - Consider nasal saline rinses 1-2 times daily to remove allergens from the nasal cavities as well as help with mucous clearance (this is especially helpful to do before the nasal sprays are given) - Consider allergy shots as a means of long-term control. - Allergy shots "re-train" and "reset" the immune system to ignore environmental allergens and decrease the resulting immune response to those allergens (sneezing, itchy watery eyes, runny nose, nasal congestion, etc).    - Allergy shots improve symptoms in 75-85% of patients.  - Check with your insurance about coverage. - We have several patients who get their allergy shots at the health center at [facility].   2. Return in about 3 months (around 01/16/2021).    Subjective:   Tony [name] is a 45 y.o. male presenting today for evaluation of  Chief Complaint  Patient presents with  . Allergy Testing    Wants to see what he is allergic to. As a adolescent he was on allergy shots in [facility]. He also did self administered allergy injections as an adult as well.    53 [address]udo has a history of the following: Patient Active  Problem List   Diagnosis Date Noted  . Mid back pain 07/09/2020  . Neck pain 07/09/2020  . Acute bilateral low back pain without sciatica 07/09/2020  . Trigger finger, left middle finger 12/27/2019  . Lack of motivation 12/09/2019  . Urinary symptom or sign 12/09/2019  . URI (upper respiratory infection) 06/22/2019  . Cough 12/02/2018  . Bilateral shoulder bursitis 10/20/2018  . Prediabetes 06/03/2018  . Hepatitis, acute 07/24/2017  . Acute bronchitis 09/29/2016  . Mild intermittent asthma with acute exacerbation 09/29/2016  . Esophageal reflux 06/17/2016  . Nonallopathic lesion of cervical region 04/02/2016  . Nonallopathic lesion of thoracic region 04/02/2016  . Nonallopathic lesion-rib cage 04/02/2016  . Pain in right acromioclavicular joint 02/21/2016  . Chronic upper back pain 12/18/2015  . Depression 05/31/2015  . Overweight   . Asthma, mild intermittent 03/31/2013  . Migraine   . Obstructive sleep apnea   . Generalized anxiety disorder   . Seasonal and perennial allergic rhinitis   . Essential hypertension   . ADD (attention deficit disorder)     History obtained from: chart review and patient.  07/31/2015 Tony [name] was referred by 06/01/2013, MD.     Tony [name] is a 45 y.o. male presenting for an evaluation of allergies and asthma.  He was born in [place], [birth year]. He has had allergic rhinitis for his entire life. He tells me that his grandfather had terrible COPD and asthma.    Asthma/Respiratory Symptom History: He has a long standing history of asthma. He has been in and out of the hospital when he  was a child. There are several family members who have had lung cancer, although they did a lot of smoking. He has Xopenex to use as needed, but typically this is not very often. He thinks that he outgrew the asthma for the most part. He does occasionally have something that causes him to wheeze.  Allergic Rhinitis Symptom History: He has been tested on a number of occasionally. He  works in Psychologist, educational at [employer]. He has noted worsening symptoms when he is around moldy environments. The last time that he was tested was around 8 years ago. He was tested for allergies by Dr. Maple Hudson and he was on allergy shots for a number of months. This was an ENT doctor who prescribed the shots in [place]. He is currently on Claritin in the mornings for years. He keeps Benadryl all over. This does not cause sleepiness for him. He does have a prescription a few nose sprays. He does have Afrin that he uses on particularly bad days. He is wondering whether he c an get the shots himself at home, which is how he got them previously.   He is getting a PhD in Research scientist (life sciences). 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   Otherwise, there is no history of other atopic diseases, including food allergies, drug allergies, stinging insect allergies, eczema, urticaria or contact dermatitis. There is no significant infectious history. Vaccinations are up to date.    Past Medical History: Patient Active Problem List   Diagnosis Date Noted  . Mid back pain 07/09/2020  . Neck pain 07/09/2020  . Acute bilateral low back pain without sciatica 07/09/2020  . Trigger finger, left middle finger 12/27/2019  . Lack of motivation 12/09/2019  . Urinary symptom or sign 12/09/2019  . URI (upper respiratory infection) 06/22/2019  . Cough 12/02/2018  . Bilateral shoulder bursitis 10/20/2018  . Prediabetes 06/03/2018  . Hepatitis, acute 07/24/2017  . Acute bronchitis 09/29/2016  . Mild intermittent asthma with acute exacerbation 09/29/2016  . Esophageal reflux 06/17/2016  . Nonallopathic lesion of cervical region 04/02/2016  . Nonallopathic lesion of thoracic region 04/02/2016  . Nonallopathic lesion-rib cage 04/02/2016  . Pain in right acromioclavicular joint 02/21/2016  . Chronic upper back pain  12/18/2015  . Depression 05/31/2015  . Overweight   . Asthma, mild intermittent 03/31/2013  . Migraine   . Obstructive sleep apnea   . Generalized anxiety disorder   . Seasonal and perennial allergic rhinitis   . Essential hypertension   . ADD (attention deficit disorder)     Medication List:  Allergies as of 10/18/2020      Reactions   Erythromycin    Pt states med make him sick      Medication List       Accurate as of October 18, 2020  6:40 PM. If you have any questions, ask your nurse or doctor.        acyclovir ointment 5 % Commonly known as: ZOVIRAX APPLY TOPICALLY EVERY 3 HOURS   albuterol (2.5 MG/3ML) 0.083% nebulizer solution Commonly known as: PROVENTIL Take 3 mLs (2.5 mg total) by nebulization every 6 (six) hours as needed for wheezing or shortness of breath.   ALPRAZolam 0.5 MG tablet Commonly known as: XANAX TAKE ONE TABLET BY MOUTH TWICE A DAY AS NEEDED FOR ANXIETY   amphetamine-dextroamphetamine 30 MG 24 hr capsule Commonly known as: ADDERALL XR  Take 1 capsule (30 mg total) by mouth every morning.   Azelastine-Fluticasone 137-50 MCG/ACT Susp Place 1 spray into the nose 2 (two) times daily.   butalbital-acetaminophen-caffeine 50-325-40 MG tablet Commonly known as: FIORICET TAKE 1-2 TABLETS BY MOUTH EVERY 6 HOURS AS NEEDED FOR MIGRAINE   chlorpheniramine 4 MG tablet Commonly known as: CHLOR-TRIMETON Take 1 tablet (4 mg total) by mouth every 6 (six) hours as needed for allergies.   cyclobenzaprine 10 MG tablet Commonly known as: FLEXERIL Take 1 tablet (10 mg total) by mouth 3 (three) times daily.   Duexis 800-26.6 MG Tabs Generic drug: Ibuprofen-Famotidine TAKE ONE TABLET BY MOUTH THREE TIMES DAILY   fluticasone 50 MCG/ACT nasal spray Commonly known as: FLONASE USE TWO SPRAY IN NOSE ONCE DAILY   hydrOXYzine 10 MG tablet Commonly known as: ATARAX/VISTARIL Take 1 tablet (10 mg total) by mouth 3 (three) times daily as needed.   Insulin Pen  Needle 32G X 6 MM Misc Commonly known as: BD Pen Needle Micro U/F Use with Saxenda   levalbuterol 45 MCG/ACT inhaler Commonly known as: XOPENEX HFA INHALE 2 PUFFS INTO THE LUNGS EVERY 6 HOURS AS NEEDED FOR WHEEZING   losartan-hydrochlorothiazide 100-25 MG tablet Commonly known as: HYZAAR Take 1 tablet by mouth daily.   meloxicam 7.5 MG tablet Commonly known as: MOBIC TAKE 1 TABLET(7.5 MG) BY MOUTH DAILY   pantoprazole 40 MG tablet Commonly known as: PROTONIX Take 1 tablet (40 mg total) by mouth daily.   Pennsaid 2 % Soln Generic drug: Diclofenac Sodium Place 2 g onto the skin 2 (two) times daily.   predniSONE 50 MG tablet Commonly known as: DELTASONE Take 1 tablet (50 mg total) by mouth daily.   sertraline 100 MG tablet Commonly known as: ZOLOFT Take 1.5 tablets (150 mg total) by mouth daily.   SUMAtriptan 6 MG/0.5ML Soln injection Commonly known as: Imitrex Inject 0.5 mLs (6 mg total) into the skin every 2 (two) hours as needed for migraine or headache. May repeat in 2 hrs if headache persists   SUMAtriptan 100 MG tablet Commonly known as: Imitrex Take 1 tablet (100 mg total) by mouth once for 1 dose. May repeat in 2 hours if headache persists or recurs.  MDD 2       Birth History: non-contributory  Developmental History: non-contributory  Past Surgical History: Past Surgical History:  Procedure Laterality Date  . LASIK  2005  . WISDOM TOOTH EXTRACTION       Family History: Family History  Problem Relation Age of Onset  . Hypertension Mother   . Arthritis Father   . Hypertension Father   . Lung cancer Maternal Grandfather   . Diabetes Other   . Emphysema Paternal Grandfather   . Asthma Paternal Grandfather      Social History: Tony [name] lives at home by himself. He has cats in the home. There is no smoking exposure at all. He has an apartment here in [place] and then a house in [place], [place], where he is from. There are roaches in his building and  he definitely thinks that there is some mold. He was a smoker until 2010.    Review of Systems  Constitutional: Negative.  Negative for chills, fever, malaise/fatigue and weight loss.  HENT: Positive for congestion. Negative for ear discharge, ear pain and sinus pain.   Eyes: Negative for pain, discharge and redness.  Respiratory: Negative for cough, sputum production, shortness of breath and wheezing.   Cardiovascular: Negative.  Negative for chest pain  and palpitations.  Gastrointestinal: Negative for abdominal pain, constipation, diarrhea, heartburn, nausea and vomiting.  Skin: Negative.  Negative for itching and rash.  Neurological: Negative for dizziness and headaches.  Endo/Heme/Allergies: Positive for environmental allergies. Does not bruise/bleed easily.       Objective:   Blood pressure 134/78, pulse (!) 109, temperature 97.7 F (36.5 C), resp. rate 14, height 5' 5.5" (1.664 m), weight 199 lb 6.4 oz (90.4 kg), SpO2 98 %. Body mass index is 32.68 kg/m.   Physical Exam:   Physical Exam Constitutional:      Appearance: He is well-developed.     Comments: Very talkative male. Cooperative with the exam.   HENT:     Head: Normocephalic and atraumatic.     Right Ear: Tympanic membrane, ear canal and external ear normal. No drainage, swelling or tenderness. Tympanic membrane is not injected, scarred, erythematous, retracted or bulging.     Left Ear: Tympanic membrane, ear canal and external ear normal. No drainage, swelling or tenderness. Tympanic membrane is not injected, scarred, erythematous, retracted or bulging.     Nose: No nasal deformity, septal deviation, mucosal edema, rhinorrhea or epistaxis.     Right Turbinates: Enlarged, swollen and pale.     Left Turbinates: Enlarged, swollen and pale.     Right Sinus: No maxillary sinus tenderness or frontal sinus tenderness.     Left Sinus: No maxillary sinus tenderness or frontal sinus tenderness.     Comments: No nasal  polyps present.     Mouth/Throat:     Mouth: Oropharynx is clear and moist. Mucous membranes are not pale and not dry.     Pharynx: Uvula midline.     Comments: Moderate cobblestoning present in the posterior oropharynx.  Eyes:     General:        Right eye: No discharge.        Left eye: No discharge.     Extraocular Movements: EOM normal.     Conjunctiva/sclera: Conjunctivae normal.     Right eye: Right conjunctiva is not injected. No chemosis.    Left eye: Left conjunctiva is not injected. No chemosis.    Pupils: Pupils are equal, round, and reactive to light.  Cardiovascular:     Rate and Rhythm: Normal rate and regular rhythm.     Heart sounds: Normal heart sounds.  Pulmonary:     Effort: Pulmonary effort is normal. No tachypnea, accessory muscle usage or respiratory distress.     Breath sounds: Normal breath sounds. No wheezing, rhonchi or rales.     Comments: Moving air well in all lung fields. No increased work of breathing noted.  Chest:     Chest wall: No tenderness.  Abdominal:     Tenderness: There is no abdominal tenderness. There is no guarding or rebound.  Lymphadenopathy:     Head:     Right side of head: No submandibular, tonsillar or occipital adenopathy.     Left side of head: No submandibular, tonsillar or occipital adenopathy.     Cervical: No cervical adenopathy.  Skin:    General: Skin is warm.     Capillary Refill: Capillary refill takes less than 2 seconds.     Coloration: Skin is not pale.     Findings: No abrasion, erythema, petechiae or rash. Rash is not papular, urticarial or vesicular.     Comments: No eczematous or urticarial lesions appreciated.   Neurological:     Mental Status: He is alert.  Psychiatric:  Mood and Affect: Mood and affect normal.      Diagnostic studies:    Spirometry: results normal (FEV1: 3.02/87%, FVC: 3.63/83%, FEV1/FVC: 83%).    Spirometry consistent with normal pattern.   Allergy Studies:     Airborne  Adult Perc - 10/18/20 1445    Time Antigen Placed 1445    Allergen Manufacturer Waynette ButteryGreer    Location Back    Number of Test 59    1. Control-Buffer 50% Glycerol Negative    2. Control-Histamine 1 mg/ml 2+    3. Albumin saline Negative    4. Bahia 2+    5. French Southern Territ[place] 2+    6. Johnson 2+    7. Kentucky Blue 2+    8. Meadow Fescue 2+    9. Perennial Rye 2+    10. Sweet Vernal 2+    11. Timothy 2+    12. Cocklebur Negative    13. Burweed Marshelder Negative    14. Ragweed, short Negative    15. Ragweed, Giant Negative    16. Plantain,  English Negative    17. Lamb's Quarters Negative    18. Sheep Sorrell Negative    19. Rough Pigweed Negative    20. Marsh Elder, Rough Negative    21. Mugwort, Common Negative    22. Ash mix Negative    23. Birch mix Negative    24. Beech American Negative    25. Box, Elder Negative    26. Cedar, red Negative    27. Cottonwood, Guinea-Bissau[place]astern Negative    28. Elm mix Negative    29. Hickory Negative    30. Maple mix Negative    31. Oak, Guinea-Bissau[place]astern mix Negative    32. Pecan Pollen Negative    33. Pine mix Negative    34. Sycamore Eastern Negative    35. Walnut, Black Pollen Negative    36. Alternaria alternata Negative    37. Cladosporium Herbarum Negative    38. Aspergillus mix Negative    39. Penicillium mix Negative    40. Bipolaris sorokiniana (Helminthosporium) Negative    41. Drechslera spicifera (Curvularia) Negative    42. Mucor plumbeus Negative    43. Fusarium moniliforme Negative    44. Aureobasidium pullulans (pullulara) Negative    45. Rhizopus oryzae Negative    46. Botrytis cinera Negative    47. Epicoccum nigrum Negative    48. Phoma betae Negative    49. Candida Albicans Negative    50. Trichophyton mentagrophytes Negative    51. Mite, D Farinae  5,000 AU/ml 3+    52. Mite, D Pteronyssinus  5,000 AU/ml 3+    53. Cat Hair 10,000 BAU/ml Negative    54.  Dog Epithelia Negative    55. Mixed Feathers Negative    56. Horse Epithelia 2+     57. Cockroach, German 2+    58. Mouse Negative    59. Tobacco Leaf Negative          Intradermal - 10/18/20 1512    Time Antigen Placed 1512    Allergen Manufacturer Waynette ButteryGreer    Location Arm    Number of Test 10    Control Negative    Ragweed mix Negative    Weed mix 1+    Tree mix Negative    Mold 1 2+    Mold 2 2+    Mold 3 3+    Mold 4 3+    Cat 1+    Dog Negative  Allergy testing results were read and interpreted by myself, documented by clinical staff.         Salvatore Marvel, MD [facility]

## 2020-10-18 NOTE — Patient Instructions (Addendum)
1. Seasonal and perennial allergic rhinitis - Testing today showed: grasses, ragweed, indoor molds, outdoor molds, dust mites, cat, dog and cockroach and horse - Copy of test results provided.  - Avoidance measures provided. - Continue with: Claritin (loratadine) 10mg  tablet once daily and Benadryl 25mg  as needed - You can use an extra dose of the antihistamine, if needed, for breakthrough symptoms.  - Consider nasal saline rinses 1-2 times daily to remove allergens from the nasal cavities as well as help with mucous clearance (this is especially helpful to do before the nasal sprays are given) - Consider allergy shots as a means of long-term control. - Allergy shots "re-train" and "reset" the immune system to ignore environmental allergens and decrease the resulting immune response to those allergens (sneezing, itchy watery eyes, runny nose, nasal congestion, etc).    - Allergy shots improve symptoms in 75-85% of patients.  - Check with your insurance about coverage. - We have several patients who get their allergy shots at the health center at [facility].   2. Return in about 3 months (around 01/16/2021).   Please inform 12-10-1996 of any Emergency Department visits, hospitalizations, or changes in symptoms. Call 01/18/2021 before going to the ED for breathing or allergy symptoms since we might be able to fit you in for a sick visit. Feel free to contact us anytime with any questions, problems, or concerns.  It was a pleasure to meet you today!  Websites that have reliable patient information: 1. American Academy of Asthma, Allergy, and Immunology: www.aaaai.org 2. Food Allergy Research and Education (FARE): foodallergy.org 3. Mothers of Asthmatics: http://www.asthmacommunitynetwork.org 4. American College of Allergy, Asthma, and Immunology: www.acaai.org   COVID-19 Vaccine Information can be found at: [place] For questions related to vaccine  distribution or appointments, please email [email or web address] or call [phone].     "Like" [email or web address] on Facebook and Instagram for our latest updates!       Make sure you are registered to vote! If you have moved or changed any of your contact information, you will need to get this updated before voting!  In some cases, you MAY be able to register to vote online: [phone]    Reducing Pollen Exposure  The American Academy of Allergy, Asthma and Immunology suggests the following steps to reduce your exposure to pollen during allergy seasons.    1. Do not hang sheets or clothing out to dry; pollen may collect on these items. 2. Do not mow lawns or spend time around freshly cut grass; mowing stirs up pollen. 3. Keep windows closed at night.  Keep car windows closed while driving. 4. Minimize morning activities outdoors, a time when pollen counts are usually at their highest. 5. Stay indoors as much as possible when pollen counts or humidity is high and on windy days when pollen tends to remain in the air longer. 6. Use air conditioning when possible.  Many air conditioners have filters that trap the pollen spores. 7. Use a HEPA room air filter to remove pollen form the indoor air you breathe.  Control of Mold Allergen   Mold and fungi can grow on a variety of surfaces provided certain temperature and moisture conditions exist.  Outdoor molds grow on plants, decaying vegetation and soil.  The major outdoor mold, Alternaria and Cladosporium, are found in very high numbers during hot and dry conditions.  Generally, a late Summer - Fall peak is seen for common outdoor fungal spores.  Rain will temporarily lower outdoor mold spore count, but  counts rise rapidly when the rainy period ends.  The most important indoor molds are Aspergillus and Penicillium.  Dark, humid and poorly ventilated basements are ideal sites for mold growth.  The next most common  sites of mold growth are the bathroom and the kitchen.  Outdoor (Seasonal) Mold Control  Positive outdoor molds via skin testing: Alternaria, Cladosporium, Bipolaris (Helminthsporium), Drechslera (Curvalaria) and Mucor  1. Use air conditioning and keep windows closed 2. Avoid exposure to decaying vegetation. 3. Avoid leaf raking. 4. Avoid grain handling. 5. Consider wearing a face mask if working in moldy areas.  6.   Indoor (Perennial) Mold Control   Positive indoor molds via skin testing: Aspergillus, Penicillium, Fusarium, Aureobasidium (Pullulara) and Rhizopus  1. Maintain humidity below 50%. 2. Clean washable surfaces with 5% bleach solution. 3. Remove sources e.g. contaminated carpets.     Control of Dog or Cat Allergen  Avoidance is the best way to manage a dog or cat allergy. If you have a dog or cat and are allergic to dog or cats, consider removing the dog or cat from the home. If you have a dog or cat but don't want to find it a new home, or if your family wants a pet even though someone in the household is allergic, here are some strategies that may help keep symptoms at bay:  1. Keep the pet out of your bedroom and restrict it to only a few rooms. Be advised that keeping the dog or cat in only one room will not limit the allergens to that room. 2. Don't pet, hug or kiss the dog or cat; if you do, wash your hands with soap and water. 3. High-efficiency particulate air (HEPA) cleaners run continuously in a bedroom or living room can reduce allergen levels over time. 4. Regular use of a high-efficiency vacuum cleaner or a central vacuum can reduce allergen levels. 5. Giving your dog or cat a bath at least once a week can reduce airborne allergen.  Control of Dust Mite Allergen    Dust mites play a major role in allergic asthma and rhinitis.  They occur in environments with high humidity wherever human skin is found.  Dust mites absorb humidity from the atmosphere (ie,  they do not drink) and feed on organic matter (including shed human and animal skin).  Dust mites are a microscopic type of insect that you cannot see with the naked eye.  High levels of dust mites have been detected from mattresses, pillows, carpets, upholstered furniture, bed covers, clothes, soft toys and any woven material.  The principal allergen of the dust mite is found in its feces.  A gram of dust may contain 1,000 mites and 250,000 fecal particles.  Mite antigen is easily measured in the air during house cleaning activities.  Dust mites do not bite and do not cause harm to humans, other than by triggering allergies/asthma.    Ways to decrease your exposure to dust mites in your home:  1. Encase mattresses, box springs and pillows with a mite-impermeable barrier or cover   2. Wash sheets, blankets and drapes weekly in hot water (130 F) with detergent and dry them in a dryer on the hot setting.  3. Have the room cleaned frequently with a vacuum cleaner and a damp dust-mop.  For carpeting or rugs, vacuuming with a vacuum cleaner equipped with a high-efficiency particulate air (HEPA) filter.  The dust mite allergic individual should not be in a room which is being  cleaned and should wait 1 hour after cleaning before going into the room. 4. Do not sleep on upholstered furniture (eg, couches).   5. If possible removing carpeting, upholstered furniture and drapery from the home is ideal.  Horizontal blinds should be eliminated in the rooms where the person spends the most time (bedroom, study, television room).  Washable vinyl, roller-type shades are optimal. 6. Remove all non-washable stuffed toys from the bedroom.  Wash stuffed toys weekly like sheets and blankets above.   7. Reduce indoor humidity to less than 50%.  Inexpensive humidity monitors can be purchased at most hardware stores.  Do not use a humidifier as can make the problem worse and are not recommended.  Control of Cockroach  Allergen  Cockroach allergen has been identified as an important cause of acute attacks of asthma, especially in urban settings.  There are fifty-five species of cockroach that exist in the [place], however only three, the [place], [place] species produce allergen that can affect patients with Asthma.  Allergens can be obtained from fecal particles, egg casings and secretions from cockroaches.    1. Remove food sources. 2. Reduce access to water. 3. Seal access and entry points. 4. Spray runways with 0.5-1% Diazinon or Chlorpyrifos 5. Blow boric acid power under stoves and refrigerator. 6. Place bait stations (hydramethylnon) at feeding sites.  Allergy Shots   Allergies are the result of a chain reaction that starts in the immune system. Your immune system controls how your body defends itself. For instance, if you have an allergy to pollen, your immune system identifies pollen as an invader or allergen. Your immune system overreacts by producing antibodies called Immunoglobulin E (IgE). These antibodies travel to cells that release chemicals, causing an allergic reaction.  The concept behind allergy immunotherapy, whether it is received in the form of shots or tablets, is that the immune system can be desensitized to specific allergens that trigger allergy symptoms. Although it requires time and patience, the payback can be long-term relief.  How Do Allergy Shots Work?  Allergy shots work much like a vaccine. Your body responds to injected amounts of a particular allergen given in increasing doses, eventually developing a resistance and tolerance to it. Allergy shots can lead to decreased, minimal or no allergy symptoms.  There generally are two phases: build-up and maintenance. Build-up often ranges from three to six months and involves receiving injections with increasing amounts of the allergens. The shots are typically given once or twice a week, though more rapid  build-up schedules are sometimes used.  The maintenance phase begins when the most effective dose is reached. This dose is different for each person, depending on how allergic you are and your response to the build-up injections. Once the maintenance dose is reached, there are longer periods between injections, typically two to four weeks.  Occasionally doctors give cortisone-type shots that can temporarily reduce allergy symptoms. These types of shots are different and should not be confused with allergy immunotherapy shots.  Who Can Be Treated with Allergy Shots?  Allergy shots may be a good treatment approach for people with allergic rhinitis (hay fever), allergic asthma, conjunctivitis (eye allergy) or stinging insect allergy.   Before deciding to begin allergy shots, you should consider:  . The length of allergy season and the severity of your symptoms . Whether medications and/or changes to your environment can control your symptoms . Your desire to avoid long-term medication use . Time: allergy immunotherapy requires a major  time commitment . Cost: may vary depending on your insurance coverage  Allergy shots for children age 43 and older are effective and often well tolerated. They might prevent the onset of new allergen sensitivities or the progression to asthma.  Allergy shots are not started on patients who are pregnant but can be continued on patients who become pregnant while receiving them. In some patients with other medical conditions or who take certain common medications, allergy shots may be of risk. It is important to mention other medications you talk to your allergist.   When Will I Feel Better?  Some may experience decreased allergy symptoms during the build-up phase. For others, it may take as long as 12 months on the maintenance dose. If there is no improvement after a year of maintenance, your allergist will discuss other treatment options with you.  If you aren't  responding to allergy shots, it may be because there is not enough dose of the allergen in your vaccine or there are missing allergens that were not identified during your allergy testing. Other reasons could be that there are high levels of the allergen in your environment or major exposure to non-allergic triggers like tobacco smoke.  What Is the Length of Treatment?  Once the maintenance dose is reached, allergy shots are generally continued for three to five years. The decision to stop should be discussed with your allergist at that time. Some people may experience a permanent reduction of allergy symptoms. Others may relapse and a longer course of allergy shots can be considered.  What Are the Possible Reactions?  The two types of adverse reactions that can occur with allergy shots are local and systemic. Common local reactions include very mild redness and swelling at the injection site, which can happen immediately or several hours after. A systemic reaction, which is less common, affects the entire body or a particular body system. They are usually mild and typically respond quickly to medications. Signs include increased allergy symptoms such as sneezing, a stuffy nose or hives.  Rarely, a serious systemic reaction called anaphylaxis can develop. Symptoms include swelling in the throat, wheezing, a feeling of tightness in the chest, nausea or dizziness. Most serious systemic reactions develop within 30 minutes of allergy shots. This is why it is strongly recommended you wait in your doctor's office for 30 minutes after your injections. Your allergist is trained to watch for reactions, and his or her staff is trained and equipped with the proper medications to identify and treat them.  Who Should Administer Allergy Shots?  The preferred location for receiving shots is your prescribing allergist's office. Injections can sometimes be given at another facility where the physician and staff are  trained to recognize and treat reactions, and have received instructions by your prescribing allergist.

## 2020-10-21 ENCOUNTER — Encounter: Payer: Self-pay | Admitting: Allergy & Immunology

## 2020-10-24 ENCOUNTER — Other Ambulatory Visit: Payer: Self-pay

## 2020-10-24 ENCOUNTER — Encounter: Payer: Self-pay | Admitting: Physical Therapy

## 2020-10-24 ENCOUNTER — Ambulatory Visit: Payer: BC Managed Care – PPO | Attending: Family Medicine | Admitting: Physical Therapy

## 2020-10-24 DIAGNOSIS — M62830 Muscle spasm of back: Secondary | ICD-10-CM | POA: Diagnosis present

## 2020-10-24 DIAGNOSIS — M545 Low back pain, unspecified: Secondary | ICD-10-CM

## 2020-10-24 DIAGNOSIS — M542 Cervicalgia: Secondary | ICD-10-CM | POA: Diagnosis present

## 2020-10-24 DIAGNOSIS — R293 Abnormal posture: Secondary | ICD-10-CM

## 2020-10-24 NOTE — Therapy (Signed)
[facility] [address] [place], [place], [address] Phone: [phone]   Fax:  [phone]  Physical Therapy Treatment  Patient Details  Name: Tony [name] MRN: [id number] Date of Birth: 10-25-1975 Referring Provider (PT): Antoine Primas, DO   Encounter Date: 10/24/2020   PT End of Session - 10/24/20 1418    Visit Number 5    Number of Visits 12    Date for PT Re-Evaluation 11/08/20    Authorization Type BCBS    PT Start Time 1417    PT Stop Time 1457    PT Time Calculation (min) 40 min    Activity Tolerance Patient tolerated treatment well    Behavior During Therapy [facility] for tasks assessed/performed           Past Medical History:  Diagnosis Date  . Allergic rhinitis   . Anxiety    overlap with depression and ADD  . Asthma   . Hepatitis A   . Hypertension   . Migraine   . Migraines   . OSA on CPAP    Sleep study 12/2011: severe, CPAP    Past Surgical History:  Procedure Laterality Date  . LASIK  2005  . WISDOM TOOTH EXTRACTION      There were no vitals filed for this visit.   Subjective Assessment - 10/24/20 1419    Subjective "definitely an improvement since starting PT. He states he has been consistent with his HEP."    Patient Stated Goals He wants to not hurt any more    Currently in Pain? Yes    Pain Score 0-No pain    Aggravating Factors  sleeping posture,              [facility] PT Assessment - 10/24/20 0001      Assessment   Medical Diagnosis neck pain    Referring Provider (PT) Antoine Primas, DO                         [facility] Adult PT Treatment/Exercise - 10/24/20 0001      Exercises   Exercises Neck      Neck Exercises: Machines for Strengthening   UBE (Upper Arm Bike) L5 x 5 min   fwd/bwd x 2:30     Neck Exercises: Theraband   Rows 20 reps;Green   seated on green physioball   Other Theraband Exercises double ER with scapular retraction 1 x 20 with green  theraband    Other Theraband Exercises lower trap strengthening 2 x 15 with green band with elbows propped on bolster      Manual Therapy   Joint Mobilization T1-T7 PA grade III-IV, R rib mobs inferior mobs    Soft tissue mobilization IASTM along L upper trap/ levator scapulae and sub-occipitals      Neck Exercises: Stretches   Upper Trapezius Stretch Right;Left;1 rep;30 seconds    Upper Trapezius Stretch Limitations 1 x 30            Trigger Point Dry Needling - 10/24/20 0001    Consent Given? Yes    Education Handout Provided Previously provided    Muscles Treated Head and Neck Upper trapezius;Levator scapulae;Suboccipitals    Upper Trapezius Response Twitch reponse elicited;Palpable increased muscle length   L   Suboccipitals Response Twitch response elicited;Palpable increased muscle length                  PT Short Term Goals -  09/27/20 1345      PT SHORT TERM GOAL #1   Title He will be independent with initial HEP    Time 3    Period Weeks    Status On-going      PT SHORT TERM GOAL #2   Title He will report pain in neck and back decr 25% or more    Time 3    Period Weeks    Status On-going      PT SHORT TERM GOAL #3   Title neck rotation with improve to 55 degrees bilaterally    Time 3    Period Weeks    Status On-going      PT SHORT TERM GOAL #4   Title FOTO score will improve 5-10 points for neck    Time 3    Period Weeks    Status On-going      PT SHORT TERM GOAL #5   Title He will repor t 25% less 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    Time 3    Period Weeks    Status On-going             PT Long Term Goals - 08/07/20 1553      PT LONG TERM GOAL #1   Title He will be independent with all HEP issued    Time 6    Period Weeks    Status New      PT LONG TERM GOAL #2   Title FOTO score improved to 61% for neck    Time 6    Period Weeks    Status New      PT LONG TERM GOAL #3   Title He will report able to sleep through the  night due to decreased pain.    Time 6    Period Weeks    Status New      PT LONG TERM GOAL #4   Title Hew will be able to lift 15 # ne arm with  min to no pain to carry groceries into home .    Time 6    Period Weeks    Status New      PT LONG TERM GOAL #5   Title He will repor tno issues with walking stairs at work    Time 6    Period Weeks    Status New      Additional Long Term Goals   Additional Long Term Goals Yes      PT LONG TERM GOAL #6   Title Hw will be able to rise from chair with no pain    Time 6    Period Weeks    Status New                 Plan - 10/24/20 1457    Clinical Impression Statement pt reports consistency with his HEP and notes he is more aware of his posture and additionaly denies any pain today. continued TPDN focusing on sub-occipitals / upper traps followed with IASTM techniques. continued working on posterior chain strengthening to promote efficient posture utizling physioball to assist with anterior pelvic tilt. end of session he noted feeling like he worked hard but didn't note any pain.    PT Treatment/Interventions Taping;Dry needling;Manual techniques;Patient/family education;Therapeutic activities;Therapeutic exercise;Electrical Stimulation;Iontophoresis 4mg /ml Dexamethasone;Moist Heat;Functional mobility training;Passive range of motion    PT Next Visit Plan modalities and manual for pain. REvie and progress HEP, progress strengthening of for scapular stab and  core.    PT Home Exercise Plan PPT , LTR, [facility], cervical and scapula retraction    Consulted and Agree with Plan of Care Patient           Patient will benefit from skilled therapeutic intervention in order to improve the following deficits and impairments:  Pain,Postural dysfunction,Decreased strength,Decreased activity tolerance,Decreased range of motion,Difficulty walking,Increased muscle spasms  Visit Diagnosis: Cervicalgia  Acute bilateral low back pain without  sciatica  Abnormal posture  Muscle spasm of back     Problem List Patient Active Problem List   Diagnosis Date Noted  . Mid back pain 07/09/2020  . Neck pain 07/09/2020  . Acute bilateral low back pain without sciatica 07/09/2020  . Trigger finger, left middle finger 12/27/2019  . Lack of motivation 12/09/2019  . Urinary symptom or sign 12/09/2019  . URI (upper respiratory infection) 06/22/2019  . Cough 12/02/2018  . Bilateral shoulder bursitis 10/20/2018  . Prediabetes 06/03/2018  . Hepatitis, acute 07/24/2017  . Acute bronchitis 09/29/2016  . Mild intermittent asthma with acute exacerbation 09/29/2016  . Esophageal reflux 06/17/2016  . Nonallopathic lesion of cervical region 04/02/2016  . Nonallopathic lesion of thoracic region 04/02/2016  . Nonallopathic lesion-rib cage 04/02/2016  . Pain in right acromioclavicular joint 02/21/2016  . Chronic upper back pain 12/18/2015  . Depression 05/31/2015  . Overweight   . Asthma, mild intermittent 03/31/2013  . Migraine   . Obstructive sleep apnea   . Generalized anxiety disorder   . Seasonal and perennial allergic rhinitis   . Essential hypertension   . ADD (attention deficit disorder)    Lulu Riding PT, DPT, LAT, ATC  10/24/20  3:01 PM      [facility] [address] [place], [place], [address] Phone: [phone]   Fax:  [phone]  Name: BRYDON SPAHR MRN: [id number] Date of Birth: 1976-07-05

## 2020-10-29 ENCOUNTER — Other Ambulatory Visit: Payer: Self-pay | Admitting: Internal Medicine

## 2020-10-30 MED ORDER — AMPHETAMINE-DEXTROAMPHET ER 30 MG PO CP24
30.0000 mg | ORAL_CAPSULE | ORAL | 0 refills | Status: DC
Start: 2020-10-30 — End: 2020-12-05

## 2020-10-31 ENCOUNTER — Ambulatory Visit: Payer: BC Managed Care – PPO | Admitting: Physical Therapy

## 2020-11-07 ENCOUNTER — Other Ambulatory Visit: Payer: Self-pay

## 2020-11-07 ENCOUNTER — Ambulatory Visit: Payer: BC Managed Care – PPO | Admitting: Physical Therapy

## 2020-11-07 DIAGNOSIS — M542 Cervicalgia: Secondary | ICD-10-CM | POA: Diagnosis not present

## 2020-11-07 DIAGNOSIS — R293 Abnormal posture: Secondary | ICD-10-CM

## 2020-11-07 DIAGNOSIS — M545 Low back pain, unspecified: Secondary | ICD-10-CM

## 2020-11-07 DIAGNOSIS — M62830 Muscle spasm of back: Secondary | ICD-10-CM

## 2020-11-07 NOTE — Therapy (Signed)
[facility] Out[facility] Rehabilitation [facility] [address] [place], [place], [address] Phone: [phone]   Fax:  [phone]  Physical Therapy Treatment  Patient Details  Name: Tony [name] MRN: [id number] Date of Birth: 1976-01-30 Referring Provider (PT): Antoine Primas, DO   Encounter Date: 11/07/2020   PT End of Session - 11/07/20 1421    Visit Number 6    Number of Visits 12    Date for PT Re-Evaluation 11/08/20    Authorization Type BCBS    PT Start Time 1421    PT Stop Time 1459    PT Time Calculation (min) 38 min    Activity Tolerance Patient tolerated treatment well    Behavior During Therapy [facility] for tasks assessed/performed           Past Medical History:  Diagnosis Date  . Allergic rhinitis   . Anxiety    overlap with depression and ADD  . Asthma   . Hepatitis A   . Hypertension   . Migraine   . Migraines   . OSA on CPAP    Sleep study 12/2011: severe, CPAP    Past Surgical History:  Procedure Laterality Date  . LASIK  2005  . WISDOM TOOTH EXTRACTION      There were no vitals filed for this visit.   Subjective Assessment - 11/07/20 1422    Subjective "I am doing pretty good, I am having some stiffness."    Currently in Pain? Yes    Pain Score 4     Pain Orientation Right;Left    Pain Descriptors / Indicators Aching;Tightness              [facility] PT Assessment - 11/07/20 0001      Assessment   Medical Diagnosis neck pain    Referring Provider (PT) Antoine Primas, DO                         [facility] Adult PT Treatment/Exercise - 11/07/20 0001      Self-Care   Other Self-Care Comments  self trigger point release techniques      Neck Exercises: Machines for Strengthening   UBE (Upper Arm Bike) L5 x 6 min   fwd/ bwd x 3 min     Neck Exercises: Theraband   Shoulder Extension Blue;20 reps   x 2 sets   Rows Blue;20 reps   x 2 sets   Horizontal ABduction 15 reps;Blue      Neck Exercises: Standing    Other Standing Exercises lower trap strethcing wall y's 1 x 20      Neck Exercises: Stretches   Upper Trapezius Stretch 1 rep;30 seconds;Left;Right    Levator Stretch Right;Left;1 rep;30 seconds                  PT Education - 11/07/20 1439    Education Details reviewed muscle anatomy and referral patterns and benefits of trigger point release.    Person(s) Educated Patient    Methods Explanation;Verbal cues    Comprehension Verbalized understanding;Verbal cues required            PT Short Term Goals - 09/27/20 1345      PT SHORT TERM GOAL #1   Title He will be independent with initial HEP    Time 3    Period Weeks    Status On-going      PT SHORT TERM GOAL #2   Title He will report pain  in neck and back decr 25% or more    Time 3    Period Weeks    Status On-going      PT SHORT TERM GOAL #3   Title neck rotation with improve to 55 degrees bilaterally    Time 3    Period Weeks    Status On-going      PT SHORT TERM GOAL #4   Title FOTO score will improve 5-10 points for neck    Time 3    Period Weeks    Status On-going      PT SHORT TERM GOAL #5   Title He will repor t 25% less 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    Time 3    Period Weeks    Status On-going             PT Long Term Goals - 08/07/20 1553      PT LONG TERM GOAL #1   Title He will be independent with all HEP issued    Time 6    Period Weeks    Status New      PT LONG TERM GOAL #2   Title FOTO score improved to 61% for neck    Time 6    Period Weeks    Status New      PT LONG TERM GOAL #3   Title He will report able to sleep through the night due to decreased pain.    Time 6    Period Weeks    Status New      PT LONG TERM GOAL #4   Title Hew will be able to lift 15 # ne arm with  min to no pain to carry groceries into home .    Time 6    Period Weeks    Status New      PT LONG TERM GOAL #5   Title He will repor tno issues with walking stairs at work    Time 6     Period Weeks    Status New      Additional Long Term Goals   Additional Long Term Goals Yes      PT LONG TERM GOAL #6   Title Hw will be able to rise from chair with no pain    Time 6    Period Weeks    Status New                 Plan - 11/07/20 1444    Clinical Impression Statement pt reports continued stiffness/ soreness in bil upper traps/ levator scpauale. worked on Public affairs consultant point relesae today and discussed benefits of treatment. Continued working on shoulder strengthening with emphasis to promote posture. plan to reassess next session and determine if more PT is necessary.    PT Treatment/Interventions Taping;Dry needling;Manual techniques;Patient/family education;Therapeutic activities;Therapeutic exercise;Electrical Stimulation;Iontophoresis 4mg /ml Dexamethasone;Moist Heat;Functional mobility training;Passive range of motion    PT Next Visit Plan ERO, FOTO .modalities and manual for pain. REvie and progress HEP, progress strengthening of for scapular stab and core.    PT Home Exercise Plan PPT , LTR, SKC, cervical and scapula retraction    Consulted and Agree with Plan of Care Patient           Patient will benefit from skilled therapeutic intervention in order to improve the following deficits and impairments:  Pain,Postural dysfunction,Decreased strength,Decreased activity tolerance,Decreased range of motion,Difficulty walking,Increased muscle spasms  Visit Diagnosis: Cervicalgia  Acute bilateral low  back pain without sciatica  Abnormal posture  Muscle spasm of back     Problem List Patient Active Problem List   Diagnosis Date Noted  . Mid back pain 07/09/2020  . Neck pain 07/09/2020  . Acute bilateral low back pain without sciatica 07/09/2020  . Trigger finger, left middle finger 12/27/2019  . Lack of motivation 12/09/2019  . Urinary symptom or sign 12/09/2019  . URI (upper respiratory infection) 06/22/2019  . Cough 12/02/2018  . Bilateral  shoulder bursitis 10/20/2018  . Prediabetes 06/03/2018  . Hepatitis, acute 07/24/2017  . Acute bronchitis 09/29/2016  . Mild intermittent asthma with acute exacerbation 09/29/2016  . Esophageal reflux 06/17/2016  . Nonallopathic lesion of cervical region 04/02/2016  . Nonallopathic lesion of thoracic region 04/02/2016  . Nonallopathic lesion-rib cage 04/02/2016  . Pain in right acromioclavicular joint 02/21/2016  . Chronic upper back pain 12/18/2015  . Depression 05/31/2015  . Overweight   . Asthma, mild intermittent 03/31/2013  . Migraine   . Obstructive sleep apnea   . Generalized anxiety disorder   . Seasonal and perennial allergic rhinitis   . Essential hypertension   . ADD (attention deficit disorder)    Lulu Riding PT, DPT, LAT, ATC  11/07/20  2:58 PM      [facility] Outpatient Rehabilitation [facility] [address] [place], [place], [address] Phone: [phone]   Fax:  [phone]  Name: GWIN EAGON MRN: [id number] Date of Birth: 24-Jan-1976

## 2020-11-14 ENCOUNTER — Other Ambulatory Visit: Payer: Self-pay

## 2020-11-14 ENCOUNTER — Ambulatory Visit: Payer: BC Managed Care – PPO | Admitting: Physical Therapy

## 2020-11-14 DIAGNOSIS — M62830 Muscle spasm of back: Secondary | ICD-10-CM

## 2020-11-14 DIAGNOSIS — R293 Abnormal posture: Secondary | ICD-10-CM

## 2020-11-14 DIAGNOSIS — M545 Low back pain, unspecified: Secondary | ICD-10-CM

## 2020-11-14 DIAGNOSIS — M542 Cervicalgia: Secondary | ICD-10-CM | POA: Diagnosis not present

## 2020-11-14 NOTE — Therapy (Addendum)
[place], [place], [address] Phone: [phone]   Fax:  [phone]  Physical Therapy Treatment / Re-evaluation / Discharge  Patient Details  Name: Tony [name] MRN: [id number] Date of Birth: 21-Dec-1975 Referring Provider (PT): Hulan Saas, DO   Encounter Date: 11/14/2020   PT End of Session - 11/14/20 1635    Visit Number 7    Number of Visits 12    Date for PT Re-Evaluation 12/12/20    Authorization Type BCBS    PT Start Time 1635    PT Stop Time 1713    PT Time Calculation (min) 38 min    Activity Tolerance Patient tolerated treatment well    Behavior During Therapy [facility] for tasks assessed/performed           Past Medical History:  Diagnosis Date  . Allergic rhinitis   . Anxiety    overlap with depression and ADD  . Asthma   . Hepatitis A   . Hypertension   . Migraine   . Migraines   . OSA on CPAP    Sleep study 12/2011: severe, CPAP 42mHg    Past Surgical History:  Procedure Laterality Date  . LASIK  2005  . WISDOM TOOTH EXTRACTION      There were no vitals filed for this visit.   Subjective Assessment - 11/14/20 1637    Subjective "I've been in bad shape since the last session. Thursday I woke up with throbbing type pain, and I had to use a heating pad for Friday. I did some exercise but got nervous I was going to make it worse so I stopped."    How long can you walk comfortably? 1/4 mile.   Previously walk 2 miles    Patient Stated Goals He wants to not hurt any more    Currently in Pain? Yes    Pain Score 6     Pain Location Neck    Pain Descriptors / Indicators Aching;Tightness    Pain Type Chronic pain    Pain Onset More than a month ago    Pain Frequency Intermittent    Aggravating Factors  sleeping              [facility] PT Assessment - 11/14/20 0001      Assessment   Medical Diagnosis neck pain    Referring Provider (PT) ZHulan Saas DO       Observation/Other Assessments   Focus on Therapeutic Outcomes (FOTO)  63% function      AROM   Cervical Flexion 42    Cervical Extension 40    Cervical - Right Side Bend 30    Cervical - Left Side Bend 30    Cervical - Right Rotation 55    Cervical - Left Rotation 48                         [facility] Adult PT Treatment/Exercise - 11/14/20 0001      Lumbar Exercises: Aerobic   Nustep L5 x 5 min UE/LE      Lumbar Exercises: Machines for Strengthening   Leg Press 1 x 12 40#, 1 x 12 60#   pt attempted to do 100# discussed its better to progress slowly versus too much too soon   Other Lumbar Machine Exercise hip abductor machine 1 x 12 12# buil      Lumbar Exercises: Supine   Dead Bug 5 reps  15 secons     Neck Exercises: Stretches   Upper Trapezius Stretch 1 rep;30 seconds;Left;Right    Levator Stretch Right;Left;1 rep;30 seconds                  PT Education - 11/14/20 1718    Education Details REviewed HEP and discussed benefits of strengtheing core and hips. updated HEP today.    Person(s) Educated Patient    Methods Explanation;Verbal cues;Handout    Comprehension Verbalized understanding;Verbal cues required            PT Short Term Goals - 11/14/20 1642      PT SHORT TERM GOAL #1   Title He will be independent with initial HEP    Period Weeks    Status Achieved      PT SHORT TERM GOAL #2   Title He will report pain in neck and back decr 25% or more    Period Weeks    Status Achieved      PT SHORT TERM GOAL #3   Title neck rotation with improve to 55 degrees bilaterally    Period Weeks    Status Partially Met      PT SHORT TERM GOAL #4   Title FOTO score will improve 5-10 points for neck    Period Weeks    Status Achieved      PT SHORT TERM GOAL #5   Title He will repor t 25% less 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    Period Weeks    Status Achieved             PT Long Term Goals - 11/14/20 1651      PT LONG TERM GOAL  #1   Title He will be independent with all HEP issued    Period Weeks      PT LONG TERM GOAL #2   Title FOTO score improved to 61% for neck    Period Weeks    Status Achieved      PT LONG TERM GOAL #3   Title He will report able to sleep through the night due to decreased pain.    Status On-going      PT LONG TERM GOAL #4   Title Hew will be able to lift 15 # ne arm with  min to no pain to carry groceries into home .    Period Weeks    Status Achieved      PT LONG TERM GOAL #5   Title He will repor tno issues with walking stairs at work    Period Weeks    Status On-going      PT LONG TERM GOAL #6   Title Hw will be able to rise from chair with no pain    Period Weeks    Status Partially Met                 Plan - 11/14/20 1733    Clinical Impression Statement pt arrives reporting increased pain since the last session, increasing his difficulty with sleeping. He is demosntrates mild reduction in cervical mobility with pain noted at end ranges. focused session on stair climbing which he noted concordant lowback pain which eased with abdominal activation. continue session wtih emphasis on hip and core strengtheing. plane to see pt 1 x  week for 4 weeks work on funcitonal strength, review posture and maixmize his function by addressing the deficits listed.    Rehab Potential Good  PT Frequency 1x / week    PT Duration 4 weeks    PT Treatment/Interventions Taping;Dry needling;Manual techniques;Patient/family education;Therapeutic activities;Therapeutic exercise;Electrical Stimulation;Iontophoresis 38m/ml Dexamethasone;Moist Heat;Functional mobility training;Passive range of motion    PT Next Visit Plan ERO, FOTO .modalities and manual for pain. REvie and progress HEP, progress strengthening of for scapular stab and core.  REcheck at 9th visit and determin if he needs the other 2 visits.    PT Home Exercise Plan PPT , LTR, SKC, cervical and scapula retraction, dead bug,  standing hip abduciton, wall squat    Consulted and Agree with Plan of Care Patient           Patient will benefit from skilled therapeutic intervention in order to improve the following deficits and impairments:  Pain,Postural dysfunction,Decreased strength,Decreased activity tolerance,Decreased range of motion,Difficulty walking,Increased muscle spasms  Visit Diagnosis: Cervicalgia  Acute bilateral low back pain without sciatica  Abnormal posture  Muscle spasm of back     Problem List Patient Active Problem List   Diagnosis Date Noted  . Mid back pain 07/09/2020  . Neck pain 07/09/2020  . Acute bilateral low back pain without sciatica 07/09/2020  . Trigger finger, left middle finger 12/27/2019  . Lack of motivation 12/09/2019  . Urinary symptom or sign 12/09/2019  . URI (upper respiratory infection) 06/22/2019  . Cough 12/02/2018  . Bilateral shoulder bursitis 10/20/2018  . Prediabetes 06/03/2018  . Hepatitis, acute 07/24/2017  . Acute bronchitis 09/29/2016  . Mild intermittent asthma with acute exacerbation 09/29/2016  . Esophageal reflux 06/17/2016  . Nonallopathic lesion of cervical region 04/02/2016  . Nonallopathic lesion of thoracic region 04/02/2016  . Nonallopathic lesion-rib cage 04/02/2016  . Pain in right acromioclavicular joint 02/21/2016  . Chronic upper back pain 12/18/2015  . Depression 05/31/2015  . Overweight   . Asthma, mild intermittent 03/31/2013  . Migraine   . Obstructive sleep apnea   . Generalized anxiety disorder   . Seasonal and perennial allergic rhinitis   . Essential hypertension   . ADD (attention deficit disorder)     KStarr LakePT, DPT, LAT, ATC  11/14/20  5:37 PM      [facility] [place] [address]Phone: [phone]  Fax:  [phone] Name: Tony FEROMRN: [id number] of Birth: [birth year]    PHYSICAL THERAPY DISCHARGE  SUMMARY  Visits from Start of Care: 7  Current functional level related to goals / functional outcomes: See goals   Remaining deficits: Current status unkonwn   Education / Equipment: HEP  Plan: Patient agrees to discharge.  Patient goals were partially met. Patient is being discharged due to not returning since the last visit.  ?????        Milo Solana PT, DPT, LAT, ATC  12/27/20  1:22 PM

## 2020-12-05 ENCOUNTER — Other Ambulatory Visit: Payer: Self-pay | Admitting: Internal Medicine

## 2020-12-05 MED ORDER — AMPHETAMINE-DEXTROAMPHET ER 30 MG PO CP24
30.0000 mg | ORAL_CAPSULE | ORAL | 0 refills | Status: DC
Start: 2020-12-05 — End: 2021-01-11

## 2020-12-05 MED ORDER — ALPRAZOLAM 0.5 MG PO TABS
ORAL_TABLET | ORAL | 0 refills | Status: DC
Start: 2020-12-05 — End: 2021-12-06

## 2020-12-05 MED ORDER — SUMATRIPTAN SUCCINATE 100 MG PO TABS: ORAL_TABLET | ORAL | 5 refills | Status: DC

## 2020-12-07 ENCOUNTER — Other Ambulatory Visit: Payer: Self-pay | Admitting: Family Medicine

## 2020-12-07 NOTE — Progress Notes (Signed)
   Covid-19 Vaccination Clinic  Name:  Tony [name]    MRN: [id number] DOB: Aug 14, 1976  12/07/2020  Mr. Tony [name] was observed post Covid-19 immunization for 15 minutes without incident. He was provided with Vaccine Information Sheet and instruction to access the V-Safe system.   Mr. Tony [name] was instructed to call 911 with any severe reactions post vaccine: Marland Kitchen Difficulty breathing  . Swelling of face and throat  . A fast heartbeat  . A bad rash all over body  . Dizziness and weakness   Immunizations Administered    Name Date Dose VIS Date Route   Pfizer COVID-19 Vaccine 08/23/2020  9:00 AM 0.3 mL 07/11/2020 Intramuscular   Manufacturer: [employer], [employer]   Lot: [id number]   NDC: [address]-8

## 2020-12-08 ENCOUNTER — Other Ambulatory Visit: Payer: Self-pay | Admitting: Internal Medicine

## 2020-12-10 ENCOUNTER — Encounter: Payer: Self-pay | Admitting: Family

## 2020-12-10 ENCOUNTER — Other Ambulatory Visit: Payer: Self-pay

## 2020-12-10 ENCOUNTER — Telehealth (INDEPENDENT_AMBULATORY_CARE_PROVIDER_SITE_OTHER): Payer: BC Managed Care – PPO | Admitting: Family

## 2020-12-10 ENCOUNTER — Other Ambulatory Visit: Payer: Self-pay | Admitting: Family

## 2020-12-10 DIAGNOSIS — J209 Acute bronchitis, unspecified: Secondary | ICD-10-CM | POA: Diagnosis not present

## 2020-12-10 MED ORDER — LEVOFLOXACIN 500 MG PO TABS: 500.0000 mg | ORAL_TABLET | Freq: Every day | ORAL | 0 refills | Status: DC

## 2020-12-10 MED ORDER — HYDROCODONE-HOMATROPINE 5-1.5 MG/5ML PO SYRP
5.0000 mL | ORAL_SOLUTION | Freq: Two times a day (BID) | ORAL | 0 refills | Status: DC | PRN
Start: 2020-12-10 — End: 2020-12-26

## 2020-12-10 NOTE — Progress Notes (Signed)
Tony [name] is a 45 y.o. male with the following history as recorded in EpicCare:  Patient Active Problem List   Diagnosis Date Noted  . Mid back pain 07/09/2020  . Neck pain 07/09/2020  . Acute bilateral low back pain without sciatica 07/09/2020  . Trigger finger, left middle finger 12/27/2019  . Lack of motivation 12/09/2019  . Urinary symptom or sign 12/09/2019  . URI (upper respiratory infection) 06/22/2019  . Cough 12/02/2018  . Bilateral shoulder bursitis 10/20/2018  . Prediabetes 06/03/2018  . Hepatitis, acute 07/24/2017  . Acute bronchitis 09/29/2016  . Mild intermittent asthma with acute exacerbation 09/29/2016  . Esophageal reflux 06/17/2016  . Nonallopathic lesion of cervical region 04/02/2016  . Nonallopathic lesion of thoracic region 04/02/2016  . Nonallopathic lesion-rib cage 04/02/2016  . Pain in right acromioclavicular joint 02/21/2016  . Chronic upper back pain 12/18/2015  . Depression 05/31/2015  . Overweight   . Asthma, mild intermittent 03/31/2013  . Migraine   . Obstructive sleep apnea   . Generalized anxiety disorder   . Seasonal and perennial allergic rhinitis   . Essential hypertension   . ADD (attention deficit disorder)     Current Outpatient Medications  Medication Sig Dispense Refill  . fluticasone (FLONASE) 50 MCG/ACT nasal spray USE TWO SPRAY IN NOSE ONCE DAILY 16 g 5  . levalbuterol (XOPENEX HFA) 45 MCG/ACT inhaler INHALE 2 PUFFS INTO THE LUNGS EVERY 6 HOURS AS NEEDED FOR WHEEZING 45 g 3  . acyclovir ointment (ZOVIRAX) 5 % APPLY TOPICALLY EVERY 3 HOURS 15 g 0  . albuterol (PROVENTIL) (2.5 MG/3ML) 0.083% nebulizer solution Take 3 mLs (2.5 mg total) by nebulization every 6 (six) hours as needed for wheezing or shortness of breath. 75 mL 12  . ALPRAZolam (XANAX) 0.5 MG tablet TAKE ONE TABLET BY MOUTH TWICE A DAY AS NEEDED FOR ANXIETY 60 tablet 0  . amphetamine-dextroamphetamine (ADDERALL XR) 30 MG 24 hr capsule Take 1 capsule (30 mg total) by  mouth every morning. 30 capsule 0  . Azelastine-Fluticasone 137-50 MCG/ACT SUSP Place 1 spray into the nose 2 (two) times daily. (Patient not taking: Reported on 12/10/2020) 23 g 5  . butalbital-acetaminophen-caffeine (FIORICET, ESGIC) 50-325-40 MG tablet TAKE 1-2 TABLETS BY MOUTH EVERY 6 HOURS AS NEEDED FOR MIGRAINE 20 tablet 0  . chlorpheniramine (CHLOR-TRIMETON) 4 MG tablet Take 1 tablet (4 mg total) by mouth every 6 (six) hours as needed for allergies. (Patient not taking: No sig reported) 120 tablet 3  . cyclobenzaprine (FLEXERIL) 10 MG tablet Take 1 tablet (10 mg total) by mouth 3 (three) times daily. 30 tablet 3  . Diclofenac Sodium (PENNSAID) 2 % SOLN Place 2 g onto the skin 2 (two) times daily. 112 g 3  . DUEXIS 800-26.6 MG TABS TAKE ONE TABLET BY MOUTH THREE TIMES DAILY (Patient not taking: Reported on 12/10/2020) 270 tablet 1  . HYDROcodone-homatropine (HYCODAN) 5-1.5 MG/5ML syrup Take 5 mLs by mouth every 12 (twelve) hours as needed for cough. 120 mL 0  . hydrOXYzine (ATARAX/VISTARIL) 10 MG tablet Take 1 tablet (10 mg total) by mouth 3 (three) times daily as needed. 30 tablet 0  . Insulin Pen Needle (BD ULTRA-FINE MICRO PEN NEEDLE) 32G X 6 MM MISC Use with Saxenda 100 each 1  . levofloxacin (LEVAQUIN) 500 MG tablet Take 1 tablet (500 mg total) by mouth daily. 10 tablet 0  . losartan-hydrochlorothiazide (HYZAAR) 100-25 MG tablet Take 1 tablet by mouth daily. 90 tablet 3  . meloxicam (MOBIC) 7.5  MG tablet TAKE 1 TABLET(7.5 MG) BY MOUTH DAILY 30 tablet 0  . pantoprazole (PROTONIX) 40 MG tablet TAKE 1 TABLET(40 MG) BY MOUTH DAILY 90 tablet 1  . sertraline (ZOLOFT) 100 MG tablet Take 1.5 tablets (150 mg total) by mouth daily. 135 tablet 1  . SUMAtriptan (IMITREX) 100 MG tablet Take 1 pill at onset of migraine.  May repeat in 2 hours if headache persists or recurs.  MDD 2 10 tablet 5  . SUMAtriptan (IMITREX) 6 MG/0.5ML SOLN injection Inject 0.5 mLs (6 mg total) into the skin every 2 (two) hours  as needed for migraine or headache. May repeat in 2 hrs if headache persists 0.5 mL 5   No current facility-administered medications for this visit.    Allergies: Erythromycin  Past Medical History:  Diagnosis Date  . Allergic rhinitis   . Anxiety    overlap with depression and ADD  . Asthma   . Hepatitis A   . Hypertension   . Migraine   . Migraines   . OSA on CPAP    Sleep study 12/2011: severe, CPAP    Past Surgical History:  Procedure Laterality Date  . LASIK  2005  . WISDOM TOOTH EXTRACTION      Family History  Problem Relation Age of Onset  . Hypertension Mother   . Arthritis Father   . Hypertension Father   . Lung cancer Maternal Grandfather   . Diabetes Other   . Emphysema Paternal Grandfather   . Asthma Paternal Grandfather     Social History   Tobacco Use  . Smoking status: Former Smoker    Packs/day: 0.24    Years: 3.00    Pack years: 0.72    Types: Cigarettes    Quit date: 09/22/2008    Years since quitting: 12.2  . Smokeless tobacco: Never Used  Substance Use Topics  . Alcohol use: Yes    Alcohol/week: 0.0 standard drinks    Comment: OCCASSIONAL    Subjective:   I connected with Brandy Kabat Shibata on 12/10/20 at 10:40 AM EDT by a video enabled telemedicine application and verified that I am speaking with the correct person using two identifiers.   I discussed the limitations of evaluation and management by telemedicine and the availability of in person appointments. The patient expressed understanding and agreed to proceed. Provider in office/ patient is at home; provider and patient are only 2 people on video call.   Worsening cough/ chest congestion; notes symptoms started last week with sneezing and headache; was seen at [facility] and thought to be allergies; had negative COVID, flu and strep test done there; feels like cough is worsening and moving into his chest; ears are popping as well;    Objective:  There were no vitals filed  for this visit.  General: Well developed, well nourished, in no acute distress  Head: Normocephalic and atraumatic  Lungs: Respirations unlabored;  Neurologic: Alert and oriented; speech intact; face symmetrical;   Assessment:  1. Acute bronchitis, unspecified organism     Plan:  Will treat with Levaquin which he notes his PCP gives him yearly "for this issue" and he always responds well; encouraged to use his albuterol inhaler every 4-6 hours as needed; Rx for Hycodan cough syrup; increase fluids, rest; work note for next 3 days; follow up worse, no better.   No follow-ups on file.  No orders of the defined types were placed in this encounter.   Requested Prescriptions  Signed Prescriptions Disp Refills  . levofloxacin (LEVAQUIN) 500 MG tablet 10 tablet 0    Sig: Take 1 tablet (500 mg total) by mouth daily.  Marland Kitchen HYDROcodone-homatropine (HYCODAN) 5-1.5 MG/5ML syrup 120 mL 0    Sig: Take 5 mLs by mouth every 12 (twelve) hours as needed for cough.

## 2020-12-26 ENCOUNTER — Other Ambulatory Visit: Payer: Self-pay | Admitting: Family

## 2020-12-27 MED ORDER — HYDROCODONE-HOMATROPINE 5-1.5 MG/5ML PO SYRP
5.0000 mL | ORAL_SOLUTION | Freq: Two times a day (BID) | ORAL | 0 refills | Status: DC | PRN
Start: 2020-12-27 — End: 2021-04-25

## 2021-01-11 ENCOUNTER — Other Ambulatory Visit: Payer: Self-pay | Admitting: Internal Medicine

## 2021-01-11 ENCOUNTER — Other Ambulatory Visit: Payer: Self-pay | Admitting: Family Medicine

## 2021-01-11 MED ORDER — AMPHETAMINE-DEXTROAMPHET ER 30 MG PO CP24
30.0000 mg | ORAL_CAPSULE | ORAL | 0 refills | Status: DC
Start: 2021-01-11 — End: 2021-02-20

## 2021-02-20 ENCOUNTER — Other Ambulatory Visit: Payer: Self-pay | Admitting: Internal Medicine

## 2021-02-20 MED ORDER — AMPHETAMINE-DEXTROAMPHET ER 30 MG PO CP24
30.0000 mg | ORAL_CAPSULE | ORAL | 0 refills | Status: DC
Start: 2021-02-20 — End: 2021-04-07

## 2021-04-07 ENCOUNTER — Other Ambulatory Visit: Payer: Self-pay | Admitting: Internal Medicine

## 2021-04-09 ENCOUNTER — Encounter: Payer: Self-pay | Admitting: Internal Medicine

## 2021-04-09 MED ORDER — AMPHETAMINE-DEXTROAMPHET ER 30 MG PO CP24
30.0000 mg | ORAL_CAPSULE | ORAL | 0 refills | Status: DC
Start: 2021-04-09 — End: 2021-05-20

## 2021-04-16 ENCOUNTER — Ambulatory Visit: Payer: BC Managed Care – PPO | Admitting: Internal Medicine

## 2021-04-25 NOTE — Progress Notes (Signed)
Subjective:    Patient ID: Tony [name], male    DOB: [birth year], 45 y.o.   MRN: [id number]  HPI The patient is here for follow up of their chronic medical problems, including htn, prediabetes, gerd, migraines, asthma, anxiety, depression, ADD  He has been drinking only water.  He is walking more with his day to day and he has lost weight.  Stress level is high right now because of work in the time of year, but overall he feels he is doing well  Has been experiencing some intermittent chest discomfort.  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  Ibuprofen does help.  In the past he took indomethacin and wondered about a prescription for that.  Stretching in certain ways often provides relief.  Rest also provides relief.  Medications and allergies reviewed with patient and updated if appropriate.  Patient Active Problem List   Diagnosis Date Noted   Mid back pain 07/09/2020   Neck pain 07/09/2020   Acute bilateral low back pain without sciatica 07/09/2020   Trigger finger, left middle finger 12/27/2019   Lack of motivation 12/09/2019   URI (upper respiratory infection) 06/22/2019   Cough 12/02/2018   Bilateral shoulder bursitis 10/20/2018   Prediabetes 06/03/2018   Hepatitis, acute 07/24/2017   Acute bronchitis 09/29/2016   Mild intermittent asthma with acute exacerbation 09/29/2016   Esophageal reflux 06/17/2016   Nonallopathic lesion of cervical region 04/02/2016   Nonallopathic lesion of thoracic region 04/02/2016   Nonallopathic lesion-rib cage 04/02/2016   Pain in right acromioclavicular joint 02/21/2016   Chronic upper back pain 12/18/2015   Depression 05/31/2015   Overweight    Asthma, mild intermittent 03/31/2013   Migraine    Obstructive sleep apnea    Generalized anxiety disorder    Seasonal and perennial allergic rhinitis    Essential hypertension    ADD (attention deficit disorder)      Current Outpatient Medications on File Prior to Visit  Medication Sig Dispense Refill   acyclovir ointment (ZOVIRAX) 5 % APPLY TOPICALLY EVERY 3 HOURS 15 g 0   albuterol (PROVENTIL) (2.5 MG/3ML) 0.083% nebulizer solution Take 3 mLs (2.5 mg total) by nebulization every 6 (six) hours as needed for wheezing or shortness of breath. 75 mL 12   ALPRAZolam (XANAX) 0.5 MG tablet TAKE ONE TABLET BY MOUTH TWICE A DAY AS NEEDED FOR ANXIETY 60 tablet 0   amphetamine-dextroamphetamine (ADDERALL XR) 30 MG 24 hr capsule Take 1 capsule (30 mg total) by mouth every morning. 30 capsule 0   butalbital-acetaminophen-caffeine (FIORICET, ESGIC) 50-325-40 MG tablet TAKE 1-2 TABLETS BY MOUTH EVERY 6 HOURS AS NEEDED FOR MIGRAINE 20 tablet 0   cyclobenzaprine (FLEXERIL) 10 MG tablet Take 1 tablet (10 mg total) by mouth 3 (three) times daily. 30 tablet 3   Diclofenac Sodium (PENNSAID) 2 % SOLN Place 2 g onto the skin 2 (two) times daily. 112 g 3   fluticasone (FLONASE) 50 MCG/ACT nasal spray USE TWO SPRAY IN NOSE ONCE DAILY 16 g 5   hydrOXYzine (ATARAX/VISTARIL) 10 MG tablet Take 1 tablet (10 mg total) by mouth 3 (three) times daily as needed. 30 tablet 0   Insulin Pen Needle (BD ULTRA-FINE MICRO PEN NEEDLE) 32G X 6 MM MISC Use with Saxenda 100 each 1   levalbuterol (XOPENEX HFA) 45 MCG/ACT inhaler INHALE 2 PUFFS INTO THE LUNGS EVERY 6 HOURS AS NEEDED FOR WHEEZING 45  g 3   losartan-hydrochlorothiazide (HYZAAR) 100-25 MG tablet Take 1 tablet by mouth daily. 90 tablet 3   meloxicam (MOBIC) 7.5 MG tablet TAKE 1 TABLET(7.5 MG) BY MOUTH DAILY 30 tablet 0   pantoprazole (PROTONIX) 40 MG tablet TAKE 1 TABLET(40 MG) BY MOUTH DAILY 90 tablet 1   sertraline (ZOLOFT) 100 MG tablet Take 1.5 tablets (150 mg total) by mouth daily. 135 tablet 1   SUMAtriptan (IMITREX) 100 MG tablet Take 1 pill at onset of migraine.  May repeat in 2 hours if headache persists or recurs.  MDD 2 10 tablet 5   No current facility-administered  medications on file prior to visit.    Past Medical History:  Diagnosis Date   Allergic rhinitis    Anxiety    overlap with depression and ADD   Asthma    Hepatitis A    Hypertension    Migraine    Migraines    OSA on CPAP    Sleep study 12/2011: severe, CPAP    Past Surgical History:  Procedure Laterality Date   LASIK  2005   WISDOM TOOTH EXTRACTION      Social History   Socioeconomic History   Marital status: Single    Spouse name: Not on file   Number of children: Not on file   Years of education: Not on file   Highest education level: Not on file  Occupational History   Occupation: RESIDENT HALL DIRECTOR    Employer: [employer]  Tobacco Use   Smoking status: Former    Packs/day: 0.24    Years: 3.00    Pack years: 0.72    Types: Cigarettes    Quit date: 09/22/2008    Years since quitting: 12.6   Smokeless tobacco: Never  Substance and Sexual Activity   Alcohol use: Yes    Alcohol/week: 0.0 standard drinks    Comment: OCCASSIONAL   Drug use: No   Sexual activity: Not on file  Other Topics Concern   Not on file  Social History Narrative   Single, 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, international aid at [employer]   Social Determinants of Health   Financial Resource Strain: Not on file  Food Insecurity: Not on file  Transportation Needs: Not on file  Physical Activity: Not on file  Stress: Not on file  Social Connections: Not on file    Family History  Problem Relation Age of Onset   Hypertension Mother    Arthritis Father    Hypertension Father    Lung cancer Maternal Grandfather    Diabetes Other    Emphysema Paternal Grandfather    Asthma Paternal Grandfather     Review of Systems  Constitutional:  Negative for chills and fever.  Respiratory:  Positive for cough. Negative for shortness of breath and wheezing.   Cardiovascular:  Negative for chest pain, palpitations and leg swelling.  Neurological:  Positive for headaches  (migraines). Negative for dizziness and light-headedness.  Psychiatric/Behavioral:  Positive for dysphoric mood. The patient is nervous/anxious.       Objective:   Vitals:   04/26/21 1505  BP: 136/82  Pulse: 94  Temp: 98.5 F (36.9 C)  SpO2: 99%   BP Readings from Last 3 Encounters:  04/26/21 136/82  10/18/20 134/78  07/25/20 (!) 120/92   Wt Readings from Last 3 Encounters:  04/26/21 173 lb (78.5 kg)  10/18/20 199 lb 6.4 oz (90.4 kg)  07/25/20 208 lb (94.3 kg)  Body mass index is 28.35 kg/m.   Depression screen [facility] 2/9 04/26/2021 02/07/2020 10/21/2018  Decreased Interest 1 2 2   Down, Depressed, Hopeless 1 2 1   PHQ - 2 Score 2 4 3   Altered sleeping 1 2 1   Tired, decreased energy 2 2 2   Change in appetite 2 2 0  Feeling bad or failure about yourself  1 0 2  Trouble concentrating 2 3 2   Moving slowly or fidgety/restless 1 2 1   Suicidal thoughts 0 0 0  PHQ-9 Score 11 15 11   Difficult doing work/chores Somewhat difficult - -  Some recent data might be hidden    GAD 7 : Generalized Anxiety Score 04/26/2021 02/07/2020 10/21/2018  Nervous, Anxious, on Edge 2 3 3   Control/stop worrying 2 3 3   Worry too much - different things 2 2 3   Trouble relaxing 2 3 2   Restless 2 1 0  Easily annoyed or irritable 1 2 2   Afraid - awful might happen 0 3 1  Total GAD 7 Score 11 17 14   Anxiety Difficulty Somewhat difficult - -      Physical Exam    Constitutional: Appears well-developed and well-nourished. No distress.  HENT:  Head: Normocephalic and atraumatic.  Neck: Neck supple. No tracheal deviation present. No thyromegaly present.  No cervical lymphadenopathy Cardiovascular: Normal rate, regular rhythm and normal heart sounds.   No murmur heard. No carotid bruit .  No edema Pulmonary/Chest: No chest wall pain with palpation along sternum, lower ribs or xiphoid process.  Effort normal and breath sounds normal. No respiratory distress. No has no wheezes. No rales.  Skin: Skin is  warm and dry. Not diaphoretic.  Psychiatric: Normal mood and affect. Behavior is normal.      Assessment & Plan:    See Problem List for Assessment and Plan of chronic medical problems.    This visit occurred during the SARS-CoV-2 public health emergency.  Safety protocols were in place, including screening questions prior to the visit, additional usage of staff PPE, and extensive cleaning of exam room while observing appropriate contact time as indicated for disinfecting solutions.

## 2021-04-25 NOTE — Patient Instructions (Addendum)
  Blood work was ordered.     Medications changes include :   indomethacin  Your prescription(s) have been submitted to your pharmacy. Please take as directed and contact our office if you believe you are having problem(s) with the medication(s).    Please followup in 6 months

## 2021-04-26 ENCOUNTER — Ambulatory Visit: Payer: BC Managed Care – PPO | Admitting: Internal Medicine

## 2021-04-26 ENCOUNTER — Other Ambulatory Visit: Payer: Self-pay

## 2021-04-26 ENCOUNTER — Encounter: Payer: Self-pay | Admitting: Internal Medicine

## 2021-04-26 VITALS — BP 136/82 | HR 94 | Temp 98.5°F | Ht 65.5 in | Wt 173.0 lb

## 2021-04-26 DIAGNOSIS — J4520 Mild intermittent asthma, uncomplicated: Secondary | ICD-10-CM

## 2021-04-26 DIAGNOSIS — I10 Essential (primary) hypertension: Secondary | ICD-10-CM

## 2021-04-26 DIAGNOSIS — F411 Generalized anxiety disorder: Secondary | ICD-10-CM

## 2021-04-26 DIAGNOSIS — R0789 Other chest pain: Secondary | ICD-10-CM

## 2021-04-26 DIAGNOSIS — G43909 Migraine, unspecified, not intractable, without status migrainosus: Secondary | ICD-10-CM

## 2021-04-26 DIAGNOSIS — F988 Other specified behavioral and emotional disorders with onset usually occurring in childhood and adolescence: Secondary | ICD-10-CM

## 2021-04-26 DIAGNOSIS — R7303 Prediabetes: Secondary | ICD-10-CM

## 2021-04-26 DIAGNOSIS — Z1331 Encounter for screening for depression: Secondary | ICD-10-CM

## 2021-04-26 DIAGNOSIS — K219 Gastro-esophageal reflux disease without esophagitis: Secondary | ICD-10-CM

## 2021-04-26 DIAGNOSIS — F3289 Other specified depressive episodes: Secondary | ICD-10-CM

## 2021-04-26 LAB — CBC WITH DIFFERENTIAL/PLATELET
Basophils Absolute: 0 10*3/uL (ref 0.0–0.1)
Basophils Relative: 0.7 % (ref 0.0–3.0)
Eosinophils Absolute: 0.1 10*3/uL (ref 0.0–0.7)
Eosinophils Relative: 2 % (ref 0.0–5.0)
HCT: 44.8 % (ref 39.0–52.0)
Hemoglobin: 14.9 g/dL (ref 13.0–17.0)
Lymphocytes Relative: 22 % (ref 12.0–46.0)
Lymphs Abs: 1.2 10*3/uL (ref 0.7–4.0)
MCHC: 33.2 g/dL (ref 30.0–36.0)
MCV: 81.9 fl (ref 78.0–100.0)
Monocytes Absolute: 0.5 10*3/uL (ref 0.1–1.0)
Monocytes Relative: 9.8 % (ref 3.0–12.0)
Neutro Abs: 3.6 10*3/uL (ref 1.4–7.7)
Neutrophils Relative %: 65.5 % (ref 43.0–77.0)
Platelets: 297 10*3/uL (ref 150.0–400.0)
RBC: 5.47 Mil/uL (ref 4.22–5.81)
RDW: 14.8 % (ref 11.5–15.5)
WBC: 5.4 10*3/uL (ref 4.0–10.5)

## 2021-04-26 LAB — COMPREHENSIVE METABOLIC PANEL
ALT: 31 U/L (ref 0–53)
AST: 18 U/L (ref 0–37)
Albumin: 4.8 g/dL (ref 3.5–5.2)
Alkaline Phosphatase: 86 U/L (ref 39–117)
BUN: 18 mg/dL (ref 6–23)
CO2: 34 mEq/L — ABNORMAL HIGH (ref 19–32)
Calcium: 9.8 mg/dL (ref 8.4–10.5)
Chloride: 95 mEq/L — ABNORMAL LOW (ref 96–112)
Creatinine, Ser: 1.27 mg/dL (ref 0.40–1.50)
GFR: 68.05 mL/min (ref >60.00)
Glucose, Bld: 76 mg/dL (ref 70–99)
Potassium: 3.5 mEq/L (ref 3.5–5.1)
Sodium: 135 mEq/L (ref 135–145)
Total Bilirubin: 0.6 mg/dL (ref 0.2–1.2)
Total Protein: 7.6 g/dL (ref 6.0–8.3)

## 2021-04-26 LAB — HEMOGLOBIN A1C: Hgb A1c MFr Bld: 6 % (ref 4.6–6.5)

## 2021-04-26 MED ORDER — SUMATRIPTAN SUCCINATE 100 MG PO TABS
ORAL_TABLET | ORAL | 5 refills | Status: DC
Start: 2021-04-26 — End: 2022-08-27

## 2021-04-26 MED ORDER — INDOMETHACIN 25 MG PO CAPS: 25.0000 mg | ORAL_CAPSULE | Freq: Three times a day (TID) | ORAL | 1 refills | Status: DC

## 2021-04-26 MED ORDER — BUTALBITAL-APAP-CAFFEINE 50-325-40 MG PO TABS
ORAL_TABLET | ORAL | 0 refills | Status: DC
Start: 2021-04-26 — End: 2021-04-26

## 2021-04-26 MED ORDER — BUTALBITAL-APAP-CAFFEINE 50-325-40 MG PO TABS
ORAL_TABLET | ORAL | 0 refills | Status: DC
Start: 2021-04-26 — End: 2022-12-04

## 2021-04-26 MED ORDER — CYCLOBENZAPRINE HCL 10 MG PO TABS
10.0000 mg | ORAL_TABLET | Freq: Three times a day (TID) | ORAL | 0 refills | Status: DC
Start: 2021-04-26 — End: 2021-10-29

## 2021-04-26 NOTE — Assessment & Plan Note (Addendum)
Chronic Not ideally controlled, GAD7 score is high Stress and anxiety level is high - it is related to work and time of year He worries about everything but he always has This will improve he thinks over the next month or so He does not want to change his medication Continue xanax 0.5 mg bid prn Continue sertraline 150 mg qd

## 2021-04-26 NOTE — Assessment & Plan Note (Signed)
Chronic Check a1c Low sugar / carb diet Stressed regular exercise  

## 2021-04-26 NOTE — Assessment & Plan Note (Addendum)
Chronic Controlled, stable Continue Adderall XR 30 mg qd

## 2021-04-26 NOTE — Assessment & Plan Note (Addendum)
Chronic Denies depression - more anxiety PHQ9 score is high but he feels that is related to anxiety more than depression Controlled, stable Continue sertraline 150 mg daily

## 2021-04-26 NOTE — Assessment & Plan Note (Signed)
Chronic Controlled Continue fioricet as needed Continue imitrex 100 mg prn

## 2021-04-26 NOTE — Assessment & Plan Note (Signed)
Chronic BP well controlled Continue hyzaar 100-25 mg qd cmp  

## 2021-04-26 NOTE — Assessment & Plan Note (Signed)
Chronic Mild, intermittent Continue xopenex inhaler prn

## 2021-04-27 DIAGNOSIS — R079 Chest pain, unspecified: Secondary | ICD-10-CM | POA: Insufficient documentation

## 2021-04-27 NOTE — Assessment & Plan Note (Signed)
Acute Has had this pain intermittently for years No tenderness on exam  Worse with activity, better with rest Stretching and getting into different positions helps Has chronic upper back pain - this may be the cause - pain sounds msk Advised to f/u with Dr Katrinka Blazing - an adjustment may help Indomethacin 15 mg with food - do not take regularly - only prn

## 2021-04-27 NOTE — Assessment & Plan Note (Signed)
Chronic GERD controlled Continue protonix 40 mg qd  

## 2021-05-20 ENCOUNTER — Other Ambulatory Visit: Payer: Self-pay | Admitting: Internal Medicine

## 2021-05-20 MED ORDER — AMPHETAMINE-DEXTROAMPHET ER 30 MG PO CP24
30.0000 mg | ORAL_CAPSULE | ORAL | 0 refills | Status: DC
Start: 2021-05-20 — End: 2021-06-24

## 2021-06-24 ENCOUNTER — Other Ambulatory Visit: Payer: Self-pay | Admitting: Internal Medicine

## 2021-06-25 MED ORDER — AMPHETAMINE-DEXTROAMPHET ER 30 MG PO CP24: 30.0000 mg | ORAL_CAPSULE | ORAL | 0 refills | Status: DC

## 2021-07-13 ENCOUNTER — Other Ambulatory Visit: Payer: Self-pay | Admitting: Internal Medicine

## 2021-07-31 ENCOUNTER — Other Ambulatory Visit: Payer: Self-pay | Admitting: Internal Medicine

## 2021-08-01 MED ORDER — AMPHETAMINE-DEXTROAMPHET ER 30 MG PO CP24: 30.0000 mg | ORAL_CAPSULE | ORAL | 0 refills | Status: DC

## 2021-08-22 ENCOUNTER — Other Ambulatory Visit: Payer: Self-pay | Admitting: Internal Medicine

## 2021-09-10 ENCOUNTER — Other Ambulatory Visit: Payer: Self-pay | Admitting: Internal Medicine

## 2021-09-10 MED ORDER — AMPHETAMINE-DEXTROAMPHET ER 30 MG PO CP24: 30.0000 mg | ORAL_CAPSULE | ORAL | 0 refills | Status: DC

## 2021-10-29 ENCOUNTER — Encounter: Payer: Self-pay | Admitting: Internal Medicine

## 2021-10-29 NOTE — Patient Instructions (Addendum)
Flu and pneumonia vaccines given.   Blood work was ordered.     Medications changes include :  none   Your prescription(s) have been submitted to your pharmacy. Please take as directed and contact our office if you believe you are having problem(s) with the medication(s).   Please followup in 6 months    Health Maintenance, Male Adopting a healthy lifestyle and getting preventive care are important in promoting health and wellness. Ask your health care provider about: The right schedule for you to have regular tests and exams. Things you can do on your own to prevent diseases and keep yourself healthy. What should I know about diet, weight, and exercise? Eat a healthy diet  Eat a diet that includes plenty of vegetables, fruits, low-fat dairy products, and lean protein. Do not eat a lot of foods that are high in solid fats, added sugars, or sodium. Maintain a healthy weight Body mass index (BMI) is a measurement that can be used to identify possible weight problems. It estimates body fat based on height and weight. Your health care provider can help determine your BMI and help you achieve or maintain a healthy weight. Get regular exercise Get regular exercise. This is one of the most important things you can do for your health. Most adults should: Exercise for at least 150 minutes each week. The exercise should increase your heart rate and make you sweat (moderate-intensity exercise). Do strengthening exercises at least twice a week. This is in addition to the moderate-intensity exercise. Spend less time sitting. Even light physical activity can be beneficial. Watch cholesterol and blood lipids Have your blood tested for lipids and cholesterol at 47 years of age, then have this test every 5 years. You may need to have your cholesterol levels checked more often if: Your lipid or cholesterol levels are high. You are older than 47 years of age. You are at high risk for heart  disease. What should I know about cancer screening? Many types of cancers can be detected early and may often be prevented. Depending on your health history and family history, you may need to have cancer screening at various ages. This may include screening for: Colorectal cancer. Prostate cancer. Skin cancer. Lung cancer. What should I know about heart disease, diabetes, and high blood pressure? Blood pressure and heart disease High blood pressure causes heart disease and increases the risk of stroke. This is more likely to develop in people who have high blood pressure readings or are overweight. Talk with your health care provider about your target blood pressure readings. Have your blood pressure checked: Every 3-5 years if you are 32-18 years of age. Every year if you are 62 years old or older. If you are between the ages of 30 and 82 and are a current or former smoker, ask your health care provider if you should have a one-time screening for abdominal aortic aneurysm (AAA). Diabetes Have regular diabetes screenings. This checks your fasting blood sugar level. Have the screening done: Once every three years after age 52 if you are at a normal weight and have a low risk for diabetes. More often and at a younger age if you are overweight or have a high risk for diabetes. What should I know about preventing infection? Hepatitis B If you have a higher risk for hepatitis B, you should be screened for this virus. Talk with your health care provider to find out if you are at risk for hepatitis B infection.  Hepatitis C Blood testing is recommended for: Everyone born from [birth year] through 1966. Anyone with known risk factors for hepatitis C. Sexually transmitted infections (STIs) You should be screened each year for STIs, including gonorrhea and chlamydia, if: You are sexually active and are younger than 47 years of age. You are older than 47 years of age and your health care provider tells you  that you are at risk for this type of infection. Your sexual activity has changed since you were last screened, and you are at increased risk for chlamydia or gonorrhea. Ask your health care provider if you are at risk. Ask your health care provider about whether you are at high risk for HIV. Your health care provider may recommend a prescription medicine to help prevent HIV infection. If you choose to take medicine to prevent HIV, you should first get tested for HIV. You should then be tested every 3 months for as long as you are taking the medicine. Follow these instructions at home: Alcohol use Do not drink alcohol if your health care provider tells you not to drink. If you drink alcohol: Limit how much you have to 0-2 drinks a day. Know how much alcohol is in your drink. In the [place] one drink equals one 12 oz bottle of beer (355 mL), one 5 oz glass of wine (148 mL), or one 1 oz glass of hard liquor (44 mL). Lifestyle Do not use any products that contain nicotine or tobacco. These products include cigarettes, chewing tobacco, and vaping devices, such as e-cigarettes. If you need help quitting, ask your health care provider. Do not use street drugs. Do not share needles. Ask your health care provider for help if you need support or information about quitting drugs. General instructions Schedule regular health, dental, and eye exams. Stay current with your vaccines. Tell your health care provider if: You often feel depressed. You have ever been abused or do not feel safe at home. Summary Adopting a healthy lifestyle and getting preventive care are important in promoting health and wellness. Follow your health care provider's instructions about healthy diet, exercising, and getting tested or screened for diseases. Follow your health care provider's instructions on monitoring your cholesterol and blood pressure. This information is not intended to replace advice given to you by your health  care provider. Make sure you discuss any questions you have with your health care provider. Document Revised: 01/28/2021 Document Reviewed: 01/28/2021 Elsevier Patient Education  2022 [employer].

## 2021-10-29 NOTE — Progress Notes (Signed)
Subjective:    Patient ID: Tony [name], male    DOB: 05/28/1976, 46 y.o.   MRN: [id number]   This visit occurred during the SARS-CoV-2 public health emergency.  Safety protocols were in place, including screening questions prior to the visit, additional usage of staff PPE, and extensive cleaning of exam room while observing appropriate contact time as indicated for disinfecting solutions.   HPI He is here for a physical exam.   Overall he is doing pretty well.  He has no major concerns.  Medications and allergies reviewed with patient and updated if appropriate.  Patient Active Problem List   Diagnosis Date Noted   Chest pain 04/27/2021   Mid back pain 07/09/2020   Neck pain 07/09/2020   Acute bilateral low back pain without sciatica 07/09/2020   Trigger finger, left middle finger 12/27/2019   Cough 12/02/2018   Bilateral shoulder bursitis 10/20/2018   Prediabetes 06/03/2018   Hepatitis, acute 07/24/2017   Esophageal reflux 06/17/2016   Nonallopathic lesion of cervical region 04/02/2016   Nonallopathic lesion of thoracic region 04/02/2016   Nonallopathic lesion-rib cage 04/02/2016   Pain in right acromioclavicular joint 02/21/2016   Chronic upper back pain 12/18/2015   Depression 05/31/2015   Overweight    Asthma, mild intermittent 03/31/2013   Migraine    Obstructive sleep apnea    Generalized anxiety disorder    Seasonal and perennial allergic rhinitis    Essential hypertension    ADD (attention deficit disorder)     Current Outpatient Medications on File Prior to Visit  Medication Sig Dispense Refill   acyclovir ointment (ZOVIRAX) 5 % APPLY TOPICALLY EVERY 3 HOURS 15 g 0   albuterol (PROVENTIL) (2.5 MG/3ML) 0.083% nebulizer solution Take 3 mLs (2.5 mg total) by nebulization every 6 (six) hours as needed for wheezing or shortness of breath. 75 mL 12   ALPRAZolam (XANAX) 0.5 MG tablet TAKE ONE TABLET BY MOUTH TWICE A DAY AS NEEDED FOR ANXIETY 60 tablet 0    amphetamine-dextroamphetamine (ADDERALL XR) 30 MG 24 hr capsule Take 1 capsule (30 mg total) by mouth every morning. 30 capsule 0   butalbital-acetaminophen-caffeine (FIORICET) 50-325-40 MG tablet TAKE 1-2 TABLETS BY MOUTH EVERY 6 HOURS AS NEEDED FOR MIGRAINE 20 tablet 0   Diclofenac Sodium (PENNSAID) 2 % SOLN Place 2 g onto the skin 2 (two) times daily. 112 g 3   fluticasone (FLONASE) 50 MCG/ACT nasal spray USE TWO SPRAY IN NOSE ONCE DAILY 16 g 5   hydrOXYzine (ATARAX/VISTARIL) 10 MG tablet Take 1 tablet (10 mg total) by mouth 3 (three) times daily as needed. 30 tablet 0   Insulin Pen Needle (BD ULTRA-[name] MICRO PEN NEEDLE) 32G X 6 MM MISC Use with Saxenda 100 each 1   levalbuterol (XOPENEX HFA) 45 MCG/ACT inhaler INHALE 2 PUFFS INTO THE LUNGS EVERY 6 HOURS AS NEEDED FOR WHEEZING 45 g 3   losartan-hydrochlorothiazide (HYZAAR) 100-25 MG tablet TAKE 1 TABLET BY MOUTH DAILY 90 tablet 3   pantoprazole (PROTONIX) 40 MG tablet TAKE 1 TABLET(40 MG) BY MOUTH DAILY 90 tablet 1   sertraline (ZOLOFT) 100 MG tablet Take 1.5 tablets (150 mg total) by mouth daily. 135 tablet 1   SUMAtriptan (IMITREX) 100 MG tablet Take 1 pill at onset of migraine.  May repeat in 2 hours if headache persists or recurs.  MDD 2 10 tablet 5   No current facility-administered medications on file prior to visit.    Past Medical History:  Diagnosis Date  Allergic rhinitis    Anxiety    overlap with depression and ADD   Asthma    Hepatitis A    Hypertension    Migraine    Migraines    OSA on CPAP    Sleep study 12/2011: severe, CPAP 35mmHg    Past Surgical History:  Procedure Laterality Date   LASIK  2005   WISDOM TOOTH EXTRACTION      Social History   Socioeconomic History   Marital status: Single    Spouse name: Not on file   Number of children: Not on file   Years of education: Not on file   Highest education level: Not on file  Occupational History   Occupation: [employer]    Employer: [employer]  Tobacco Use   Smoking status: Former    Packs/day: 0.24    Years: 3.00    Pack years: 0.72    Types: Cigarettes    Quit date: 09/22/2008    Years since quitting: 13.1   Smokeless tobacco: Never  Substance and Sexual Activity   Alcohol use: Yes    Alcohol/week: 0.0 standard drinks    Comment: OCCASSIONAL   Drug use: No   Sexual activity: Not on file  Other Topics Concern   Not on file  Social History Narrative   Single, 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  Eyes:  Negative for visual disturbance.  Respiratory:  Negative for cough, shortness of breath and wheezing.   Cardiovascular:  Negative for chest pain, palpitations and leg swelling.  Gastrointestinal:  Negative for abdominal pain, blood in stool, constipation, diarrhea and nausea.       Gerd controlled  Genitourinary:  Negative for difficulty urinating, dysuria and hematuria.  Musculoskeletal:  Positive for arthralgias (knees). Negative for back pain.  Skin:  Negative for rash.  Neurological:  Positive for headaches (migraine occ). Negative for light-headedness.  Psychiatric/Behavioral:  Positive for dysphoric mood. The patient is nervous/anxious.       Objective:   Vitals:   10/30/21 1043  BP: 132/80  Pulse: 94  Temp: 98.6 F (37 C)  SpO2: 100%   Filed Weights   10/30/21 1043  Weight: 180 lb (81.6 kg)   Body mass index is 29.5  kg/m.  BP Readings from Last 3 Encounters:  10/30/21 132/80  04/26/21 136/82  10/18/20 134/78    Wt Readings from Last 3 Encounters:  10/30/21 180 lb (81.6 kg)  04/26/21 173 lb (78.5 kg)  10/18/20 199 lb 6.4 oz (90.4 kg)     Physical Exam Constitutional: He appears well-developed and well-nourished. No distress.  HENT:  Head: Normocephalic and atraumatic.  Right Ear: External ear normal.  Left Ear: External ear normal.  Mouth/Throat: Oropharynx is clear and moist.  Normal ear canals and TM b/l  Eyes: Conjunctivae and EOM  are normal.  Neck: Neck supple. No tracheal deviation present. No thyromegaly present.  No carotid bruit  Cardiovascular: Normal rate, regular rhythm, normal heart sounds and intact distal pulses.   No murmur heard. Pulmonary/Chest: Effort normal and breath sounds normal. No respiratory distress. He has no wheezes. He has no rales.  Abdominal: Soft.  Umbilical hernia-nontender.  He exhibits no distension. There is no tenderness.  Genitourinary: deferred  Musculoskeletal: He exhibits no edema.  Lymphadenopathy:   He has no cervical adenopathy.  Skin: Skin is warm and dry. He is not diaphoretic.  Psychiatric: He has a normal mood and affect. His behavior is normal.         Assessment & Plan:   Physical exam: Screening blood work  ordered Exercise   active, walks a lot Weight  encouraged weight loss Substance abuse   none   Reviewed recommended immunizations.   Flu and prevnar 20 given today   Health Maintenance  Topic Date Due   COLONOSCOPY (Pts 45-20yrs Insurance coverage will need to be confirmed)  Never done   COVID-19 Vaccine (4 - Booster for Moderna series) 10/18/2020   INFLUENZA VACCINE  04/22/2021   TETANUS/TDAP  12/17/2022   Hepatitis C Screening  Completed   HIV Screening  Completed   HPV VACCINES  Aged Out     See Problem List for Assessment and Plan of chronic medical problems.

## 2021-10-30 ENCOUNTER — Other Ambulatory Visit: Payer: Self-pay

## 2021-10-30 ENCOUNTER — Ambulatory Visit (INDEPENDENT_AMBULATORY_CARE_PROVIDER_SITE_OTHER): Payer: BC Managed Care – PPO | Admitting: Internal Medicine

## 2021-10-30 ENCOUNTER — Encounter: Payer: Self-pay | Admitting: Internal Medicine

## 2021-10-30 VITALS — BP 132/80 | HR 94 | Temp 98.6°F | Ht 65.5 in | Wt 180.0 lb

## 2021-10-30 DIAGNOSIS — Z23 Encounter for immunization: Secondary | ICD-10-CM | POA: Diagnosis not present

## 2021-10-30 DIAGNOSIS — Z0000 Encounter for general adult medical examination without abnormal findings: Secondary | ICD-10-CM

## 2021-10-30 DIAGNOSIS — F411 Generalized anxiety disorder: Secondary | ICD-10-CM

## 2021-10-30 DIAGNOSIS — F3289 Other specified depressive episodes: Secondary | ICD-10-CM

## 2021-10-30 DIAGNOSIS — G43909 Migraine, unspecified, not intractable, without status migrainosus: Secondary | ICD-10-CM

## 2021-10-30 DIAGNOSIS — R7303 Prediabetes: Secondary | ICD-10-CM

## 2021-10-30 DIAGNOSIS — Z0001 Encounter for general adult medical examination with abnormal findings: Secondary | ICD-10-CM | POA: Diagnosis not present

## 2021-10-30 DIAGNOSIS — J4520 Mild intermittent asthma, uncomplicated: Secondary | ICD-10-CM | POA: Diagnosis not present

## 2021-10-30 DIAGNOSIS — F988 Other specified behavioral and emotional disorders with onset usually occurring in childhood and adolescence: Secondary | ICD-10-CM

## 2021-10-30 DIAGNOSIS — K429 Umbilical hernia without obstruction or gangrene: Secondary | ICD-10-CM

## 2021-10-30 DIAGNOSIS — I10 Essential (primary) hypertension: Secondary | ICD-10-CM

## 2021-10-30 DIAGNOSIS — K219 Gastro-esophageal reflux disease without esophagitis: Secondary | ICD-10-CM

## 2021-10-30 LAB — COMPREHENSIVE METABOLIC PANEL
ALT: 24 U/L (ref 0–53)
AST: 17 U/L (ref 0–37)
Albumin: 4.8 g/dL (ref 3.5–5.2)
Alkaline Phosphatase: 75 U/L (ref 39–117)
BUN: 16 mg/dL (ref 6–23)
CO2: 31 mEq/L (ref 19–32)
Calcium: 10.1 mg/dL (ref 8.4–10.5)
Chloride: 97 mEq/L (ref 96–112)
Creatinine, Ser: 1.27 mg/dL (ref 0.40–1.50)
GFR: 67.8 mL/min (ref >60.00)
Glucose, Bld: 95 mg/dL (ref 70–99)
Potassium: 4.4 mEq/L (ref 3.5–5.1)
Sodium: 136 mEq/L (ref 135–145)
Total Bilirubin: 0.5 mg/dL (ref 0.2–1.2)
Total Protein: 7.7 g/dL (ref 6.0–8.3)

## 2021-10-30 LAB — CBC WITH DIFFERENTIAL/PLATELET
Basophils Absolute: 0 10*3/uL (ref 0.0–0.1)
Basophils Relative: 0.8 % (ref 0.0–3.0)
Eosinophils Absolute: 0.3 10*3/uL (ref 0.0–0.7)
Eosinophils Relative: 5.1 % — ABNORMAL HIGH (ref 0.0–5.0)
HCT: 44.9 % (ref 39.0–52.0)
Hemoglobin: 15.1 g/dL (ref 13.0–17.0)
Lymphocytes Relative: 25.7 % (ref 12.0–46.0)
Lymphs Abs: 1.3 10*3/uL (ref 0.7–4.0)
MCHC: 33.7 g/dL (ref 30.0–36.0)
MCV: 83.5 fl (ref 78.0–100.0)
Monocytes Absolute: 0.5 10*3/uL (ref 0.1–1.0)
Monocytes Relative: 11.2 % (ref 3.0–12.0)
Neutro Abs: 2.8 10*3/uL (ref 1.4–7.7)
Neutrophils Relative %: 57.2 % (ref 43.0–77.0)
Platelets: 272 10*3/uL (ref 150.0–400.0)
RBC: 5.38 Mil/uL (ref 4.22–5.81)
RDW: 14.9 % (ref 11.5–15.5)
WBC: 4.9 10*3/uL (ref 4.0–10.5)

## 2021-10-30 LAB — HEMOGLOBIN A1C: Hgb A1c MFr Bld: 5.8 % (ref 4.6–6.5)

## 2021-10-30 LAB — LIPID PANEL
Cholesterol: 174 mg/dL (ref 0–200)
HDL: 59.7 mg/dL (ref >39.00)
LDL Cholesterol: 100 mg/dL — ABNORMAL HIGH (ref 0–99)
NonHDL: 114.01
Total CHOL/HDL Ratio: 3
Triglycerides: 71 mg/dL (ref 0.0–149.0)
VLDL: 14.2 mg/dL (ref 0.0–40.0)

## 2021-10-30 LAB — TSH: TSH: 1.9 u[IU]/mL (ref 0.35–5.50)

## 2021-10-30 MED ORDER — AMPHETAMINE-DEXTROAMPHET ER 30 MG PO CP24: 30.0000 mg | ORAL_CAPSULE | ORAL | 0 refills | Status: DC

## 2021-10-30 MED ORDER — CYCLOBENZAPRINE HCL 10 MG PO TABS: 10.0000 mg | ORAL_TABLET | Freq: Three times a day (TID) | ORAL | 0 refills | Status: DC

## 2021-10-30 NOTE — Assessment & Plan Note (Signed)
Chronic Controlled, Stable Continue Adderall XR 30 mg daily

## 2021-10-30 NOTE — Assessment & Plan Note (Addendum)
Chronic Controlled, Stable Continue sertraline 150 mg daily, Xanax 0.5 mg twice daily as needed, which he is not taking often

## 2021-10-30 NOTE — Assessment & Plan Note (Signed)
Chronic Blood pressure well controlled CMP Continue losartan-HCTZ 100-25 mg daily 

## 2021-10-30 NOTE — Assessment & Plan Note (Signed)
Chronic Controlled Continue Fioricet as needed Continue Imitrex 100 mg as needed

## 2021-10-30 NOTE — Assessment & Plan Note (Signed)
Chronic Nontender, symptomatic Monitor only

## 2021-10-30 NOTE — Assessment & Plan Note (Signed)
Chronic GERD controlled Continue pantoprazole 40 mg daily 

## 2021-10-30 NOTE — Assessment & Plan Note (Signed)
Chronic Controlled Mild, intermittent Continue Xopenex inhaler as needed

## 2021-10-30 NOTE — Assessment & Plan Note (Signed)
Chronic Check a1c Low sugar / carb diet Stressed regular exercise  

## 2021-10-30 NOTE — Addendum Note (Signed)
Addended by: Marcina Millard on: 10/30/2021 03:37 PM   Modules accepted: Orders

## 2021-10-30 NOTE — Assessment & Plan Note (Signed)
Chronic Controlled, Stable Continue sertraline 150 mg daily 

## 2021-12-06 ENCOUNTER — Other Ambulatory Visit: Payer: Self-pay | Admitting: Internal Medicine

## 2021-12-07 MED ORDER — AMPHETAMINE-DEXTROAMPHET ER 30 MG PO CP24: 30.0000 mg | ORAL_CAPSULE | ORAL | 0 refills | Status: DC

## 2021-12-12 ENCOUNTER — Other Ambulatory Visit: Payer: Self-pay

## 2021-12-12 ENCOUNTER — Encounter: Payer: Self-pay | Admitting: Internal Medicine

## 2021-12-12 MED ORDER — SERTRALINE HCL 100 MG PO TABS: 150.0000 mg | ORAL_TABLET | Freq: Every day | ORAL | 1 refills | Status: DC

## 2022-01-13 ENCOUNTER — Other Ambulatory Visit: Payer: Self-pay | Admitting: Internal Medicine

## 2022-01-13 MED ORDER — AMPHETAMINE-DEXTROAMPHET ER 30 MG PO CP24: 30.0000 mg | ORAL_CAPSULE | ORAL | 0 refills | Status: DC

## 2022-01-13 NOTE — Telephone Encounter (Signed)
LOV 10/30/21

## 2022-01-17 ENCOUNTER — Other Ambulatory Visit: Payer: Self-pay | Admitting: Internal Medicine

## 2022-01-17 MED ORDER — AMPHETAMINE-DEXTROAMPHET ER 30 MG PO CP24: 30.0000 mg | ORAL_CAPSULE | ORAL | 0 refills | Status: DC

## 2022-01-17 NOTE — Telephone Encounter (Signed)
Rec'd msg stating " [facility] is out of adderall at their warehouse. Can the prescription be sent to [facility]? Their number is [phone].".Marland Kitchen/lmb ?

## 2022-02-28 ENCOUNTER — Other Ambulatory Visit: Payer: Self-pay | Admitting: Internal Medicine

## 2022-02-28 MED ORDER — AMPHETAMINE-DEXTROAMPHET ER 30 MG PO CP24: 30.0000 mg | ORAL_CAPSULE | ORAL | 0 refills | Status: DC

## 2022-02-28 MED ORDER — CYCLOBENZAPRINE HCL 10 MG PO TABS: 10.0000 mg | ORAL_TABLET | Freq: Three times a day (TID) | ORAL | 0 refills | Status: DC

## 2022-04-06 ENCOUNTER — Other Ambulatory Visit: Payer: Self-pay | Admitting: Internal Medicine

## 2022-04-09 ENCOUNTER — Other Ambulatory Visit: Payer: Self-pay | Admitting: Internal Medicine

## 2022-04-10 MED ORDER — AMPHETAMINE-DEXTROAMPHET ER 30 MG PO CP24: 30.0000 mg | ORAL_CAPSULE | ORAL | 0 refills | Status: DC

## 2022-04-15 ENCOUNTER — Ambulatory Visit: Payer: BC Managed Care – PPO | Admitting: Family Medicine

## 2022-04-15 ENCOUNTER — Ambulatory Visit: Payer: Self-pay

## 2022-04-15 VITALS — BP 128/82 | HR 90 | Ht 65.5 in | Wt 183.0 lb

## 2022-04-15 DIAGNOSIS — M25512 Pain in left shoulder: Secondary | ICD-10-CM

## 2022-04-15 DIAGNOSIS — M7061 Trochanteric bursitis, right hip: Secondary | ICD-10-CM | POA: Insufficient documentation

## 2022-04-15 DIAGNOSIS — M7552 Bursitis of left shoulder: Secondary | ICD-10-CM | POA: Diagnosis not present

## 2022-04-15 DIAGNOSIS — M25511 Pain in right shoulder: Secondary | ICD-10-CM | POA: Diagnosis not present

## 2022-04-15 DIAGNOSIS — M7551 Bursitis of right shoulder: Secondary | ICD-10-CM | POA: Diagnosis not present

## 2022-04-15 DIAGNOSIS — G8929 Other chronic pain: Secondary | ICD-10-CM | POA: Diagnosis not present

## 2022-04-15 NOTE — Assessment & Plan Note (Signed)
Patient given injection and tolerated the procedure well, discussed icing regimen and home exercises.  Discussed which activities to do which ones to avoid.  Increase activity slowly.  Follow-up again in 6 to 8 weeks patient is moving and may need to find another provider down in [place].

## 2022-04-15 NOTE — Progress Notes (Signed)
[facility] [facility] Phone: [phone] Subjective:   [name], am serving as a scribe for Dr. Hulan Saas.   I'm seeing this patient by the request  of:  Binnie Rail, MD  CC: Right shoulder and right hip pain  [id number]  Tony [name] is a 46 y.o. male coming in with complaint of shoulder and hip pain. Last seen for OMT in 2021. History of B shoulder bursitis. Would like R shoulder injection today.   He has also developed pain in R greater trochanter. Pain with a lot of movement. Has been taking IBU and using heat.        Past Medical History:  Diagnosis Date   Allergic rhinitis    Anxiety    overlap with depression and ADD   Asthma    Hepatitis A    Hypertension    Migraine    Migraines    OSA on CPAP    Sleep study 12/2011: severe, CPAP 57mmHg   Past Surgical History:  Procedure Laterality Date   LASIK  2005   WISDOM TOOTH EXTRACTION     Social History   Socioeconomic History   Marital status: Single    Spouse name: Not on file   Number of children: Not on file   Years of education: Not on file   Highest education level: Not on file  Occupational History   Occupation: B[employer]    Employer: [employer]  Tobacco Use   Smoking status: Former    Packs/day: 0.24    Years: 3.00    Total pack years: 0.72    Types: Cigarettes    Quit date: 09/22/2008    Years since quitting: 13.5   Smokeless tobacco: Never  Substance and Sexual Activity   Alcohol use: Yes    Alcohol/week: 0.0 standard drinks of alcohol    Comment: OCCASSIONAL   Drug use: No   Sexual activity: Not on file  Other Topics Concern   Not on file  Social History Narrative   Single, employed with residents life as Ambulance person at [employer]   Social Determinants of Health   Financial Resource Strain: Not on file  Food Insecurity: Not on file  Transportation Needs: Not on file  Physical  Activity: Not on file  Stress: Not on file  Social Connections: Not on file   Allergies  Allergen Reactions   Erythromycin     Pt states med make him sick   Family History  Problem Relation Age of Onset   Hypertension Mother    Arthritis Father    Hypertension Father    Lung cancer Maternal Grandfather    Diabetes Other    Emphysema Paternal Grandfather    Asthma Paternal Grandfather      Current Outpatient Medications (Cardiovascular):    losartan-hydrochlorothiazide (HYZAAR) 100-25 MG tablet, TAKE 1 TABLET BY MOUTH DAILY  Current Outpatient Medications (Respiratory):    albuterol (PROVENTIL) (2.5 MG/3ML) 0.083% nebulizer solution, Take 3 mLs (2.5 mg total) by nebulization every 6 (six) hours as needed for wheezing or shortness of breath.   fluticasone (FLONASE) 50 MCG/ACT nasal spray, USE TWO SPRAY IN NOSE ONCE DAILY   levalbuterol (XOPENEX HFA) 45 MCG/ACT inhaler, INHALE 2 PUFFS INTO THE LUNGS EVERY 6 HOURS AS NEEDED FOR WHEEZING  Current Outpatient Medications (Analgesics):    butalbital-acetaminophen-caffeine (FIORICET) 50-325-40 MG tablet, TAKE 1-2 TABLETS BY MOUTH EVERY 6 HOURS AS  NEEDED FOR MIGRAINE   SUMAtriptan (IMITREX) 100 MG tablet, Take 1 pill at onset of migraine.  May repeat in 2 hours if headache persists or recurs.  MDD 2   Current Outpatient Medications (Other):    acyclovir ointment (ZOVIRAX) 5 %, APPLY TOPICALLY EVERY 3 HOURS   ALPRAZolam (XANAX) 0.5 MG tablet, TAKE 1 TABLET BY MOUTH TWICE DAILY AS NEEDED FOR ANXIETY   amphetamine-dextroamphetamine (ADDERALL XR) 30 MG 24 hr capsule, Take 1 capsule (30 mg total) by mouth every morning.   cyclobenzaprine (FLEXERIL) 10 MG tablet, Take 1 tablet (10 mg total) by mouth 3 (three) times daily.   Diclofenac Sodium (PENNSAID) 2 % SOLN, Place 2 g onto the skin 2 (two) times daily.   hydrOXYzine (ATARAX/VISTARIL) 10 MG tablet, Take 1 tablet (10 mg total) by mouth 3 (three) times daily as needed.   Insulin Pen Needle  (BD ULTRA-FINE MICRO PEN NEEDLE) 32G X 6 MM MISC, Use with Saxenda   pantoprazole (PROTONIX) 40 MG tablet, TAKE 1 TABLET(40 MG) BY MOUTH DAILY   sertraline (ZOLOFT) 100 MG tablet, Take 1.5 tablets (150 mg total) by mouth daily.   Reviewed prior external information including notes and imaging from  primary care provider As well as notes that were available from care everywhere and other healthcare systems.  Past medical history, social, surgical and family history all reviewed in electronic medical record.  No pertanent information unless stated regarding to the chief complaint.   Review of Systems:  No headache, visual changes, nausea, vomiting, diarrhea, constipation, dizziness, abdominal pain, skin rash, fevers, chills, night sweats, weight loss, swollen lymph nodes, body aches, joint swelling, chest pain, shortness of breath, mood changes. POSITIVE muscle aches  Objective  There were no vitals taken for this visit.   General: No apparent distress alert and oriented x3 mood and affect normal, dressed appropriately.  HEENT: Pupils equal, extraocular movements intact  Respiratory: Patient's speak in full sentences and does not appear short of breath  Cardiovascular: No lower extremity edema, non tender, no erythema  Shoulder: Right Inspection reveals no abnormalities, atrophy or asymmetry. Palpation is normal with no tenderness over AC joint or bicipital groove. ROM is full in all planes passively. Rotator cuff strength normal throughout. signs of impingement with positive Neer and Hawkin's tests, but negative empty can sign. Speeds and Yergason's tests normal. No labral pathology noted with negative Obrien's, negative clunk and good stability. Normal scapular function observed. No painful arc and no drop arm sign. No apprehension sign  Right hip exam does have some tenderness to palpation over the greater trochanteric area.  No tenderness to palpation with FABER test.  Negative  straight leg test.  Minimal back pain  Procedure: Real-time Ultrasound Guided Injection of right glenohumeral joint Device: GE Logiq E  Ultrasound guided injection is preferred based studies that show increased duration, increased effect, greater accuracy, decreased procedural pain, increased response rate with ultrasound guided versus blind injection.  Verbal informed consent obtained.  Time-out conducted.  Noted no overlying erythema, induration, or other signs of local infection.  Skin prepped in a sterile fashion.  Local anesthesia: Topical Ethyl chloride.  With sterile technique and under real time ultrasound guidance:  Joint visualized.  23g 1  inch needle inserted posterior approach. Pictures taken for needle placement. Patient did have injection of  2 cc of 0.5% Marcaine, and 1.0 cc of Kenalog 40 mg/dL. Completed without difficulty  Pain immediately resolved suggesting accurate placement of the medication.  Advised to call  if fevers/chills, erythema, induration, drainage, or persistent bleeding.  Family is  Impression: Technically successful ultrasound guided injection.    Procedure: Real-time Ultrasound Guided Injection of right greater trochanteric bursitis secondary to patient's body habitus Device: GE Logiq Q7 Ultrasound guided injection is preferred based studies that show increased duration, increased effect, greater accuracy, decreased procedural pain, increased response rate, and decreased cost with ultrasound guided versus blind injection.  Verbal informed consent obtained.  Time-out conducted.  Noted no overlying erythema, induration, or other signs of local infection.  Skin prepped in a sterile fashion.  Local anesthesia: Topical Ethyl chloride.  With sterile technique and under real time ultrasound guidance:  Greater trochanteric area was visualized and patient's bursa was noted. A 22-gauge 3 inch needle was inserted and 4 cc of 0.5% Marcaine and 1 cc of Kenalog 40  mg/dL was injected. Pictures taken Completed without difficulty  Pain immediately resolved suggesting accurate placement of the medication.  Advised to call if fevers/chills, erythema, induration, drainage, or persistent bleeding.  Impression: Technically successful ultrasound guided injection.  [id number]; 15 additional minutes spent for Therapeutic exercises as stated in above notes.  This included exercises focusing on stretching, strengthening, with significant focus on eccentric aspects.   Long term goals include an improvement in range of motion, strength, endurance as well as avoiding reinjury. Patient's frequency would include in 1-2 times a day, 3-5 times a week for a duration of 6-12 weeks. Hip strengthening exercises which included:  Pelvic tilt/bracing to help with proper recruitment of the lower abs and pelvic floor muscles  Glute strengthening to properly contract glutes without over-engaging low back and hamstrings - prone hip extension and glute bridge exercises Proper stretching techniques to increase effectiveness for the hip flexors, groin, quads, piriformic and low back when appropriate   Proper technique shown and discussed handout in great detail with ATC.  All questions were discussed and answered.     Impression and Recommendations:     The above documentation has been reviewed and is accurate and complete Judi Saa, DO

## 2022-04-15 NOTE — Assessment & Plan Note (Signed)
Only having difficulty with the right side at the moment.  Given injection and tolerated the procedure well.  Patient will be moving and we will try to find another provider for him in another state if necessary.  Follow-up with me again in 6 to 8 weeks otherwise.  Patient will be moving down [employer]

## 2022-04-15 NOTE — Patient Instructions (Signed)
GT exercises Good luck with the move You know where we are if you need [place]

## 2022-04-18 ENCOUNTER — Ambulatory Visit: Payer: BC Managed Care – PPO | Admitting: Internal Medicine

## 2022-04-25 ENCOUNTER — Ambulatory Visit: Payer: BC Managed Care – PPO | Admitting: Internal Medicine

## 2022-05-01 ENCOUNTER — Encounter: Payer: Self-pay | Admitting: Internal Medicine

## 2022-05-01 NOTE — Progress Notes (Signed)
Virtual Visit via Video Note  I connected with Tony [name] on 05/02/22 at 10:00 AM EDT by a video enabled telemedicine application and verified that I am speaking with the correct person using two identifiers.   I discussed the limitations of evaluation and management by telemedicine and the availability of in person appointments. The patient expressed understanding and agreed to proceed.  Present for the visit:  Myself, Dr Cheryll Cockayne, Kathi Ludwig.  The patient is currently at home and I am in the office.    No referring provider.    History of Present Illness: He is here for follow up of his chronic medical conditions - htn, prediabetes, gerd, migraines, asthma, anxiety, depression, ADD  He is taking all of his medications as prescribed.  Everything is going well and he denies any major changes.  Stress level is lower because he has not moved and will be starting a new job in [facility].  He will be getting a new PCP at some point.  He starts his new job next week.    Review of Systems  Constitutional:  Negative for fever.  Respiratory:  Negative for cough, shortness of breath and wheezing.   Cardiovascular:  Negative for chest pain, palpitations and leg swelling.  Neurological:  Positive for headaches (occ). Negative for dizziness.      Social History   Socioeconomic History   Marital status: Single    Spouse name: Not on file   Number of children: Not on file   Years of education: Not on file   Highest education level: Not on file  Occupational History   Occupation: RESIDENT HALL DIRECTOR    Employer: [employer]  Tobacco Use   Smoking status: Former    Packs/day: 0.24    Years: 3.00    Total pack years: 0.72    Types: Cigarettes    Quit date: 09/22/2008    Years since quitting: 13.6   Smokeless tobacco: Never  Substance and Sexual Activity   Alcohol use: Yes    Alcohol/week: 0.0 standard drinks of alcohol    Comment: OCCASSIONAL   Drug use: No   Sexual  activity: Not on file  Other Topics Concern   Not on file  Social History Narrative   Single, 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, international aid at [employer]   Social Determinants of Health   Financial Resource Strain: Not on file  Food Insecurity: Not on file  Transportation Needs: Not on file  Physical Activity: Not on file  Stress: Not on file  Social Connections: Not on file     Observations/Objective: Appears well in NAD Breathing normally Mood and affect are normal  Assessment and Plan:  See Problem List for Assessment and Plan of chronic medical problems.   Follow Up Instructions:    I discussed the assessment and treatment plan with the patient. The patient was provided an opportunity to ask questions and all were answered. The patient agreed with the plan and demonstrated an understanding of the instructions.   The patient was advised to call back or seek an in-person evaluation if the symptoms worsen or if the condition fails to improve as anticipated.    Pincus Sanes, MD

## 2022-05-02 ENCOUNTER — Telehealth (INDEPENDENT_AMBULATORY_CARE_PROVIDER_SITE_OTHER): Payer: BC Managed Care – PPO | Admitting: Internal Medicine

## 2022-05-02 DIAGNOSIS — F411 Generalized anxiety disorder: Secondary | ICD-10-CM

## 2022-05-02 DIAGNOSIS — F3289 Other specified depressive episodes: Secondary | ICD-10-CM

## 2022-05-02 DIAGNOSIS — F988 Other specified behavioral and emotional disorders with onset usually occurring in childhood and adolescence: Secondary | ICD-10-CM

## 2022-05-02 DIAGNOSIS — K219 Gastro-esophageal reflux disease without esophagitis: Secondary | ICD-10-CM | POA: Diagnosis not present

## 2022-05-02 DIAGNOSIS — G43909 Migraine, unspecified, not intractable, without status migrainosus: Secondary | ICD-10-CM | POA: Diagnosis not present

## 2022-05-02 DIAGNOSIS — J4520 Mild intermittent asthma, uncomplicated: Secondary | ICD-10-CM

## 2022-05-02 DIAGNOSIS — I10 Essential (primary) hypertension: Secondary | ICD-10-CM | POA: Diagnosis not present

## 2022-05-02 DIAGNOSIS — R7303 Prediabetes: Secondary | ICD-10-CM

## 2022-05-02 NOTE — Assessment & Plan Note (Signed)
Chronic Infrequent Overall controlled Continue Fioricet as needed or Imitrex 100 mg as needed

## 2022-05-02 NOTE — Assessment & Plan Note (Addendum)
Chronic Blood pressure has been controlled Continue Hyzaar 100-25 mg daily

## 2022-05-02 NOTE — Assessment & Plan Note (Signed)
Chronic Controlled, Stable Continue sertraline 150 mg daily, alprazolam 0.5 mg twice daily as needed

## 2022-05-02 NOTE — Assessment & Plan Note (Signed)
Chronic Controlled, Stable Continue Adderall XR 30 mg q. morning

## 2022-05-02 NOTE — Assessment & Plan Note (Addendum)
Chronic Mild, intermittent Controlled Continue Xopenex or albuterol nebulizer as needed

## 2022-05-02 NOTE — Assessment & Plan Note (Signed)
Chronic GERD controlled Continue pantoprazole 40 mg daily 

## 2022-05-02 NOTE — Assessment & Plan Note (Addendum)
Chronic Lab Results  Component Value Date   HGBA1C 5.8 10/30/2021   Low sugar / carb diet Stressed regular exercise

## 2022-05-02 NOTE — Assessment & Plan Note (Signed)
Chronic Controlled, Stable Continue sertraline 150 mg daily 

## 2022-05-05 ENCOUNTER — Other Ambulatory Visit: Payer: Self-pay | Admitting: Internal Medicine

## 2022-05-07 ENCOUNTER — Other Ambulatory Visit: Payer: Self-pay | Admitting: Internal Medicine

## 2022-05-13 MED ORDER — AMPHETAMINE-DEXTROAMPHET ER 30 MG PO CP24: 30.0000 mg | ORAL_CAPSULE | ORAL | 0 refills | Status: DC

## 2022-05-13 NOTE — Telephone Encounter (Signed)
Pt is calling to check the status of the refill request.  Pt is requesting Medication be sent to: [facility] [phone] - [place], [place] - [address]

## 2022-06-16 ENCOUNTER — Other Ambulatory Visit: Payer: Self-pay | Admitting: Internal Medicine

## 2022-06-16 MED ORDER — AMPHETAMINE-DEXTROAMPHET ER 30 MG PO CP24: 30.0000 mg | ORAL_CAPSULE | ORAL | 0 refills | Status: DC

## 2022-07-22 ENCOUNTER — Other Ambulatory Visit: Payer: Self-pay | Admitting: Internal Medicine

## 2022-07-23 MED ORDER — AMPHETAMINE-DEXTROAMPHET ER 30 MG PO CP24: 30.0000 mg | ORAL_CAPSULE | ORAL | 0 refills | Status: DC

## 2022-07-24 ENCOUNTER — Telehealth: Payer: Self-pay | Admitting: Internal Medicine

## 2022-07-24 MED ORDER — AMPHETAMINE-DEXTROAMPHET ER 30 MG PO CP24: 30.0000 mg | ORAL_CAPSULE | ORAL | 0 refills | Status: DC

## 2022-07-24 NOTE — Telephone Encounter (Signed)
Pt said his pharmacy is out of stock:  ADDERALL XR 30 MG  Please send to:  [facility] [place] [phone]  Pt phone [phone]

## 2022-08-27 ENCOUNTER — Encounter: Payer: Self-pay | Admitting: Internal Medicine

## 2022-08-27 ENCOUNTER — Other Ambulatory Visit: Payer: Self-pay

## 2022-08-27 MED ORDER — AMPHETAMINE-DEXTROAMPHET ER 30 MG PO CP24: 30.0000 mg | ORAL_CAPSULE | ORAL | 0 refills | Status: DC

## 2022-08-27 MED ORDER — SUMATRIPTAN SUCCINATE 100 MG PO TABS: ORAL_TABLET | ORAL | 5 refills | Status: AC

## 2022-08-27 MED ORDER — PANTOPRAZOLE SODIUM 40 MG PO TBEC: DELAYED_RELEASE_TABLET | ORAL | 3 refills | Status: DC

## 2022-08-27 MED ORDER — SERTRALINE HCL 100 MG PO TABS: 150.0000 mg | ORAL_TABLET | Freq: Every day | ORAL | 1 refills | Status: DC

## 2022-08-27 MED ORDER — LOSARTAN POTASSIUM-HCTZ 100-25 MG PO TABS: 1.0000 | ORAL_TABLET | Freq: Every day | ORAL | 3 refills | Status: DC

## 2022-09-26 ENCOUNTER — Other Ambulatory Visit: Payer: Self-pay | Admitting: Internal Medicine

## 2022-09-26 MED ORDER — AMPHETAMINE-DEXTROAMPHET ER 30 MG PO CP24: 30.0000 mg | ORAL_CAPSULE | ORAL | 0 refills | Status: DC

## 2022-11-11 ENCOUNTER — Other Ambulatory Visit: Payer: Self-pay | Admitting: Internal Medicine

## 2022-11-12 MED ORDER — AMPHETAMINE-DEXTROAMPHET ER 30 MG PO CP24: 30.0000 mg | ORAL_CAPSULE | ORAL | 0 refills | Status: DC

## 2022-12-02 ENCOUNTER — Encounter: Payer: Self-pay | Admitting: Internal Medicine

## 2022-12-02 NOTE — Patient Instructions (Addendum)
      Blood work was ordered.   The lab is on the first floor.    Medications changes include :   vyvanse 30 mg daily.  Cefdinir - antibiotic and cough syrup     Return in about 6 months (around 06/05/2023) for Physical Exam.

## 2022-12-02 NOTE — Progress Notes (Signed)
Subjective:    Patient ID: Tony [name], male    DOB: 06-16-1976, 47 y.o.   MRN: [id number]     HPI Tannor is here for follow up of his chronic medical problems.  Cold symptoms for over one week.  Has productive cough, SOB, wheeze and chest tightness.  No fever.    Adderall is not working as well  - wondered about vyvanse.   Medications and allergies reviewed with patient and updated if appropriate.  Current Outpatient Medications on File Prior to Visit  Medication Sig Dispense Refill   acyclovir ointment (ZOVIRAX) 5 % APPLY TOPICALLY EVERY 3 HOURS 15 g 0   albuterol (PROVENTIL) (2.5 MG/3ML) 0.083% nebulizer solution Take 3 mLs (2.5 mg total) by nebulization every 6 (six) hours as needed for wheezing or shortness of breath. 75 mL 12   ALPRAZolam (XANAX) 0.5 MG tablet TAKE 1 TABLET BY MOUTH TWICE DAILY AS NEEDED FOR ANXIETY 60 tablet 1   butalbital-acetaminophen-caffeine (FIORICET) 50-325-40 MG tablet TAKE 1-2 TABLETS BY MOUTH EVERY 6 HOURS AS NEEDED FOR MIGRAINE 20 tablet 0   cyclobenzaprine (FLEXERIL) 10 MG tablet Take 1 tablet (10 mg total) by mouth 3 (three) times daily. 30 tablet 0   Diclofenac Sodium (PENNSAID) 2 % SOLN Place 2 g onto the skin 2 (two) times daily. 112 g 3   fluticasone (FLONASE) 50 MCG/ACT nasal spray USE TWO SPRAY IN NOSE ONCE DAILY 16 g 5   levalbuterol (XOPENEX HFA) 45 MCG/ACT inhaler INHALE 2 PUFFS INTO THE LUNGS EVERY 6 HOURS AS NEEDED FOR WHEEZING 45 g 3   losartan-hydrochlorothiazide (HYZAAR) 100-25 MG tablet Take 1 tablet by mouth daily. 90 tablet 3   pantoprazole (PROTONIX) 40 MG tablet TAKE 1 TABLET(40 MG) BY MOUTH DAILY 90 tablet 3   SUMAtriptan (IMITREX) 100 MG tablet Take 1 pill at onset of migraine.  May repeat in 2 hours if headache persists or recurs.  MDD 2 10 tablet 5   No current facility-administered medications on file prior to visit.     Review of Systems  Constitutional:  Positive for chills. Negative for fever.  HENT:   Positive for congestion. Negative for ear pain, sinus pressure and sore throat.   Respiratory:  Positive for cough (productive - feels congestion in chest), chest tightness, shortness of breath and wheezing.   Cardiovascular:  Negative for chest pain, palpitations and leg swelling.  Neurological:  Positive for headaches. Negative for light-headedness.       Objective:   Vitals:   12/03/22 1526  BP: 122/74  Pulse: 85  Temp: 98.8 F (37.1 C)  SpO2: 99%   BP Readings from Last 3 Encounters:  12/03/22 122/74  04/15/22 128/82  10/30/21 132/80   Wt Readings from Last 3 Encounters:  12/03/22 191 lb (86.6 kg)  04/15/22 183 lb (83 kg)  10/30/21 180 lb (81.6 kg)   Body mass index is 31.3 kg/m.    Physical Exam Constitutional:      General: He is not in acute distress.    Appearance: Normal appearance. He is not ill-appearing.  HENT:     Head: Normocephalic and atraumatic.     Right Ear: Tympanic membrane, ear canal and external ear normal. There is no impacted cerumen.     Left Ear: Tympanic membrane, ear canal and external ear normal. There is no impacted cerumen.     Mouth/Throat:     Mouth: Mucous membranes are moist.     Pharynx: No oropharyngeal  exudate or posterior oropharyngeal erythema.  Eyes:     Conjunctiva/sclera: Conjunctivae normal.  Cardiovascular:     Rate and Rhythm: Normal rate and regular rhythm.     Heart sounds: Normal heart sounds.  Pulmonary:     Effort: Pulmonary effort is normal. No respiratory distress.     Breath sounds: Normal breath sounds. No wheezing or rales.  Musculoskeletal:     Cervical back: Neck supple. No tenderness.     Right lower leg: No edema.     Left lower leg: No edema.  Lymphadenopathy:     Cervical: No cervical adenopathy.  Skin:    General: Skin is warm and dry.     Findings: No rash.  Neurological:     Mental Status: He is alert. Mental status is at baseline.  Psychiatric:        Mood and Affect: Mood normal.         Lab Results  Component Value Date   WBC 4.9 10/30/2021   HGB 15.1 10/30/2021   HCT 44.9 10/30/2021   PLT 272.0 10/30/2021   GLUCOSE 95 10/30/2021   CHOL 174 10/30/2021   TRIG 71.0 10/30/2021   HDL 59.70 10/30/2021   LDLDIRECT 144.5 12/16/2012   LDLCALC 100 (H) 10/30/2021   ALT 24 10/30/2021   AST 17 10/30/2021   NA 136 10/30/2021   K 4.4 10/30/2021   CL 97 10/30/2021   CREATININE 1.27 10/30/2021   BUN 16 10/30/2021   CO2 31 10/30/2021   TSH 1.90 10/30/2021   INR 1.41 07/07/2017   HGBA1C 5.8 10/30/2021     Assessment & Plan:    See Problem List for Assessment and Plan of chronic medical problems.

## 2022-12-03 ENCOUNTER — Encounter: Payer: Self-pay | Admitting: Internal Medicine

## 2022-12-03 ENCOUNTER — Ambulatory Visit: Payer: BC Managed Care – PPO | Admitting: Internal Medicine

## 2022-12-03 VITALS — BP 122/74 | HR 85 | Temp 98.8°F | Ht 65.5 in | Wt 191.0 lb

## 2022-12-03 DIAGNOSIS — F3289 Other specified depressive episodes: Secondary | ICD-10-CM

## 2022-12-03 DIAGNOSIS — Z0001 Encounter for general adult medical examination with abnormal findings: Secondary | ICD-10-CM | POA: Diagnosis not present

## 2022-12-03 DIAGNOSIS — Z23 Encounter for immunization: Secondary | ICD-10-CM | POA: Diagnosis not present

## 2022-12-03 DIAGNOSIS — J45909 Unspecified asthma, uncomplicated: Secondary | ICD-10-CM | POA: Insufficient documentation

## 2022-12-03 DIAGNOSIS — R7303 Prediabetes: Secondary | ICD-10-CM

## 2022-12-03 DIAGNOSIS — F411 Generalized anxiety disorder: Secondary | ICD-10-CM | POA: Diagnosis not present

## 2022-12-03 DIAGNOSIS — I10 Essential (primary) hypertension: Secondary | ICD-10-CM | POA: Diagnosis not present

## 2022-12-03 DIAGNOSIS — K219 Gastro-esophageal reflux disease without esophagitis: Secondary | ICD-10-CM

## 2022-12-03 DIAGNOSIS — J40 Bronchitis, not specified as acute or chronic: Secondary | ICD-10-CM

## 2022-12-03 DIAGNOSIS — J45901 Unspecified asthma with (acute) exacerbation: Secondary | ICD-10-CM | POA: Insufficient documentation

## 2022-12-03 DIAGNOSIS — J4531 Mild persistent asthma with (acute) exacerbation: Secondary | ICD-10-CM

## 2022-12-03 DIAGNOSIS — F988 Other specified behavioral and emotional disorders with onset usually occurring in childhood and adolescence: Secondary | ICD-10-CM

## 2022-12-03 DIAGNOSIS — G43909 Migraine, unspecified, not intractable, without status migrainosus: Secondary | ICD-10-CM

## 2022-12-03 LAB — COMPREHENSIVE METABOLIC PANEL
ALT: 51 U/L (ref 0–53)
AST: 35 U/L (ref 0–37)
Albumin: 4.6 g/dL (ref 3.5–5.2)
Alkaline Phosphatase: 115 U/L (ref 39–117)
BUN: 13 mg/dL (ref 6–23)
CO2: 33 mEq/L — ABNORMAL HIGH (ref 19–32)
Calcium: 9.5 mg/dL (ref 8.4–10.5)
Chloride: 98 mEq/L (ref 96–112)
Creatinine, Ser: 1.2 mg/dL (ref 0.40–1.50)
GFR: 72.02 mL/min (ref >60.00)
Glucose, Bld: 98 mg/dL (ref 70–99)
Potassium: 4 mEq/L (ref 3.5–5.1)
Sodium: 138 mEq/L (ref 135–145)
Total Bilirubin: 0.2 mg/dL (ref 0.2–1.2)
Total Protein: 7.4 g/dL (ref 6.0–8.3)

## 2022-12-03 LAB — LIPID PANEL
Cholesterol: 205 mg/dL — ABNORMAL HIGH (ref 0–200)
HDL: 64.3 mg/dL (ref >39.00)
LDL Cholesterol: 122 mg/dL — ABNORMAL HIGH (ref 0–99)
NonHDL: 140.4
Total CHOL/HDL Ratio: 3
Triglycerides: 91 mg/dL (ref 0.0–149.0)
VLDL: 18.2 mg/dL (ref 0.0–40.0)

## 2022-12-03 LAB — CBC WITH DIFFERENTIAL/PLATELET
Basophils Absolute: 0 10*3/uL (ref 0.0–0.1)
Basophils Relative: 0.7 % (ref 0.0–3.0)
Eosinophils Absolute: 0.3 10*3/uL (ref 0.0–0.7)
Eosinophils Relative: 4.9 % (ref 0.0–5.0)
HCT: 43.5 % (ref 39.0–52.0)
Hemoglobin: 14.5 g/dL (ref 13.0–17.0)
Lymphocytes Relative: 20.2 % (ref 12.0–46.0)
Lymphs Abs: 1.1 10*3/uL (ref 0.7–4.0)
MCHC: 33.2 g/dL (ref 30.0–36.0)
MCV: 80.4 fl (ref 78.0–100.0)
Monocytes Absolute: 0.5 10*3/uL (ref 0.1–1.0)
Monocytes Relative: 9.3 % (ref 3.0–12.0)
Neutro Abs: 3.6 10*3/uL (ref 1.4–7.7)
Neutrophils Relative %: 64.9 % (ref 43.0–77.0)
Platelets: 277 10*3/uL (ref 150.0–400.0)
RBC: 5.41 Mil/uL (ref 4.22–5.81)
RDW: 15.2 % (ref 11.5–15.5)
WBC: 5.6 10*3/uL (ref 4.0–10.5)

## 2022-12-03 LAB — HEMOGLOBIN A1C: Hgb A1c MFr Bld: 6.2 % (ref 4.6–6.5)

## 2022-12-03 MED ORDER — HYDROCODONE BIT-HOMATROP MBR 5-1.5 MG/5ML PO SOLN: 5.0000 mL | Freq: Three times a day (TID) | ORAL | 0 refills | Status: AC | PRN

## 2022-12-03 MED ORDER — LISDEXAMFETAMINE DIMESYLATE 30 MG PO CAPS: 30.0000 mg | ORAL_CAPSULE | Freq: Every day | ORAL | 0 refills | Status: DC

## 2022-12-03 MED ORDER — CEFDINIR 300 MG PO CAPS: 300.0000 mg | ORAL_CAPSULE | Freq: Two times a day (BID) | ORAL | 0 refills | Status: DC

## 2022-12-03 MED ORDER — SERTRALINE HCL 100 MG PO TABS: 150.0000 mg | ORAL_TABLET | Freq: Every day | ORAL | 1 refills | Status: DC

## 2022-12-03 NOTE — Assessment & Plan Note (Signed)
Chronic Adderall not effective anymore Would like to try something different Stop Adderall Start vyvanse 30 mg daily - can try 40 mg in one month if 30 mg is not effective

## 2022-12-03 NOTE — Assessment & Plan Note (Addendum)
Chronic BP well controlled Continue losartan-hctz 100-5 mg daily cmp

## 2022-12-03 NOTE — Assessment & Plan Note (Signed)
Chronic Rare Takes imitrex 100 mg prn

## 2022-12-03 NOTE — Assessment & Plan Note (Signed)
Acute Exacerbation secondary to infection Continue albuterol as needed/ neb treatment as needed No need for prednisone now Cefdinir 300 mg bid x 10 days

## 2022-12-03 NOTE — Assessment & Plan Note (Signed)
Chronic Controlled, stable Continue sertraline 150 mg daily, alprazolam 0.5 mg bid prn

## 2022-12-03 NOTE — Assessment & Plan Note (Signed)
Chronic GERD controlled Continue pantoprazole 40 mg daily 

## 2022-12-03 NOTE — Assessment & Plan Note (Signed)
Chronic Controlled, stable Continue sertraline 150 mg daily  

## 2022-12-03 NOTE — Assessment & Plan Note (Signed)
Acute Likely bacterial  With asthma exacerbation Cefdinir 300 mg bid x 10 days Continue albuterol inhaler/neb Hycodan cough syrup

## 2022-12-03 NOTE — Assessment & Plan Note (Signed)
Chronic Check a1c Low sugar / carb diet Stressed regular exercise  

## 2022-12-04 ENCOUNTER — Other Ambulatory Visit: Payer: Self-pay | Admitting: Internal Medicine

## 2022-12-08 ENCOUNTER — Encounter: Payer: Self-pay | Admitting: Internal Medicine

## 2022-12-16 MED ORDER — METHYLPHENIDATE HCL ER (OSM) 36 MG PO TBCR: 36.0000 mg | EXTENDED_RELEASE_TABLET | Freq: Every day | ORAL | 0 refills | Status: DC

## 2022-12-19 MED ORDER — METHYLPHENIDATE HCL ER (OSM) 36 MG PO TBCR: 36.0000 mg | EXTENDED_RELEASE_TABLET | Freq: Every day | ORAL | 0 refills | Status: DC

## 2022-12-19 NOTE — Addendum Note (Signed)
Addended by: Binnie Rail on: 12/19/2022 08:07 AM   Modules accepted: Orders

## 2022-12-24 ENCOUNTER — Telehealth (INDEPENDENT_AMBULATORY_CARE_PROVIDER_SITE_OTHER): Payer: BC Managed Care – PPO | Admitting: Internal Medicine

## 2022-12-24 ENCOUNTER — Encounter: Payer: Self-pay | Admitting: Internal Medicine

## 2022-12-24 DIAGNOSIS — F3289 Other specified depressive episodes: Secondary | ICD-10-CM | POA: Diagnosis not present

## 2022-12-24 DIAGNOSIS — F411 Generalized anxiety disorder: Secondary | ICD-10-CM

## 2022-12-24 NOTE — Assessment & Plan Note (Addendum)
Chronic He feels his depression is controlled He did have an episode of transient thoughts of suicide, but no longer feels that way This was related to what happened at work and increased anxiety He does not feel like his depression is a concern right now Currently staying with family which I think is a good idea for the next few days Continue sertraline 150 mg daily Advised that if he has any thoughts of suicide or hurting himself again he needs to call me immediately

## 2022-12-24 NOTE — Assessment & Plan Note (Addendum)
Chronic Increased anxiety recently due to events at work He is seen with his parents for now anything that is a good idea, especially given what happened last week Continue alprazolam 0.5 mg twice daily as needed Continue sertraline 150 mg daily Letter written for work-will return this coming Monday Will need to work on work accommodations given his anxiety and ADD
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
## 2022-12-29 ENCOUNTER — Encounter: Payer: Self-pay | Admitting: Internal Medicine

## 2022-12-30 MED ORDER — BUSPIRONE HCL 5 MG PO TABS: 5.0000 mg | ORAL_TABLET | Freq: Three times a day (TID) | ORAL | 1 refills | Status: DC

## 2022-12-30 MED ORDER — NYSTATIN 100000 UNIT/ML MT SUSP: 5.0000 mL | Freq: Four times a day (QID) | OROMUCOSAL | 0 refills | Status: AC

## 2022-12-31 ENCOUNTER — Encounter: Payer: Self-pay | Admitting: Internal Medicine

## 2023-01-26 ENCOUNTER — Encounter: Payer: Self-pay | Admitting: Internal Medicine

## 2023-01-26 MED ORDER — AMPHETAMINE-DEXTROAMPHET ER 30 MG PO CP24: 30.0000 mg | ORAL_CAPSULE | ORAL | 0 refills | Status: DC

## 2023-02-21 ENCOUNTER — Other Ambulatory Visit: Payer: Self-pay | Admitting: Internal Medicine

## 2023-02-27 ENCOUNTER — Other Ambulatory Visit: Payer: Self-pay | Admitting: Internal Medicine

## 2023-03-06 MED ORDER — AMPHETAMINE-DEXTROAMPHET ER 30 MG PO CP24: 30.0000 mg | ORAL_CAPSULE | ORAL | 0 refills | Status: DC

## 2023-05-05 ENCOUNTER — Other Ambulatory Visit: Payer: Self-pay | Admitting: Internal Medicine

## 2023-05-06 MED ORDER — CYCLOBENZAPRINE HCL 10 MG PO TABS: 10.0000 mg | ORAL_TABLET | Freq: Three times a day (TID) | ORAL | 0 refills | Status: AC

## 2023-05-06 MED ORDER — AMPHETAMINE-DEXTROAMPHET ER 30 MG PO CP24: 30.0000 mg | ORAL_CAPSULE | ORAL | 0 refills | Status: DC

## 2023-06-21 ENCOUNTER — Other Ambulatory Visit: Payer: Self-pay | Admitting: Internal Medicine

## 2023-06-22 MED ORDER — AMPHETAMINE-DEXTROAMPHET ER 30 MG PO CP24: 30.0000 mg | ORAL_CAPSULE | ORAL | 0 refills | Status: DC

## 2023-07-24 ENCOUNTER — Other Ambulatory Visit: Payer: Self-pay | Admitting: Internal Medicine

## 2023-07-29 ENCOUNTER — Other Ambulatory Visit: Payer: Self-pay | Admitting: Internal Medicine

## 2023-07-30 MED ORDER — AMPHETAMINE-DEXTROAMPHET ER 30 MG PO CP24: 30.0000 mg | ORAL_CAPSULE | ORAL | 0 refills | Status: AC

## 2023-08-26 ENCOUNTER — Encounter: Payer: Self-pay | Admitting: Internal Medicine

## 2023-08-30 ENCOUNTER — Other Ambulatory Visit: Payer: Self-pay | Admitting: Internal Medicine

## 2023-09-15 ENCOUNTER — Other Ambulatory Visit: Payer: Self-pay | Admitting: Internal Medicine

## 2023-11-26 ENCOUNTER — Other Ambulatory Visit: Payer: Self-pay | Admitting: Internal Medicine

## 2024-07-29 ENCOUNTER — Other Ambulatory Visit: Payer: Self-pay | Admitting: Internal Medicine

## 2024-08-02 ENCOUNTER — Other Ambulatory Visit: Payer: Self-pay | Admitting: Internal Medicine
